# Patient Record
Sex: Female | Born: 1945
Health system: Southern US, Community
[De-identification: ages and names within clinical notes are randomized; demographics above are authoritative.]

## PROBLEM LIST (undated history)

## (undated) ENCOUNTER — Emergency Department (HOSPITAL_BASED_OUTPATIENT_CLINIC_OR_DEPARTMENT_OTHER): Admission: EM | Payer: Medicare Other | Source: Home / Self Care

## (undated) DIAGNOSIS — K219 Gastro-esophageal reflux disease without esophagitis: Secondary | ICD-10-CM

## (undated) DIAGNOSIS — I4891 Unspecified atrial fibrillation: Secondary | ICD-10-CM

## (undated) DIAGNOSIS — E28319 Asymptomatic premature menopause: Secondary | ICD-10-CM

## (undated) DIAGNOSIS — D72828 Other elevated white blood cell count: Secondary | ICD-10-CM

## (undated) DIAGNOSIS — D509 Iron deficiency anemia, unspecified: Secondary | ICD-10-CM

## (undated) DIAGNOSIS — J309 Allergic rhinitis, unspecified: Secondary | ICD-10-CM

## (undated) DIAGNOSIS — Z9889 Other specified postprocedural states: Secondary | ICD-10-CM

## (undated) DIAGNOSIS — Z85820 Personal history of malignant melanoma of skin: Secondary | ICD-10-CM

## (undated) DIAGNOSIS — I700 Atherosclerosis of aorta: Secondary | ICD-10-CM

## (undated) DIAGNOSIS — I5032 Chronic diastolic (congestive) heart failure: Secondary | ICD-10-CM

## (undated) DIAGNOSIS — E119 Type 2 diabetes mellitus without complications: Secondary | ICD-10-CM

## (undated) DIAGNOSIS — I272 Pulmonary hypertension, unspecified: Secondary | ICD-10-CM

## (undated) DIAGNOSIS — M47812 Spondylosis without myelopathy or radiculopathy, cervical region: Secondary | ICD-10-CM

## (undated) DIAGNOSIS — C801 Malignant (primary) neoplasm, unspecified: Secondary | ICD-10-CM

## (undated) DIAGNOSIS — I2510 Atherosclerotic heart disease of native coronary artery without angina pectoris: Secondary | ICD-10-CM

## (undated) DIAGNOSIS — M8580 Other specified disorders of bone density and structure, unspecified site: Secondary | ICD-10-CM

## (undated) DIAGNOSIS — R296 Repeated falls: Secondary | ICD-10-CM

## (undated) DIAGNOSIS — N183 Chronic kidney disease, stage 3 unspecified: Secondary | ICD-10-CM

## (undated) DIAGNOSIS — G4733 Obstructive sleep apnea (adult) (pediatric): Secondary | ICD-10-CM

## (undated) DIAGNOSIS — F419 Anxiety disorder, unspecified: Secondary | ICD-10-CM

## (undated) DIAGNOSIS — E785 Hyperlipidemia, unspecified: Secondary | ICD-10-CM

## (undated) DIAGNOSIS — M7500 Adhesive capsulitis of unspecified shoulder: Secondary | ICD-10-CM

## (undated) DIAGNOSIS — E559 Vitamin D deficiency, unspecified: Secondary | ICD-10-CM

## (undated) DIAGNOSIS — F32A Depression, unspecified: Secondary | ICD-10-CM

## (undated) DIAGNOSIS — R112 Nausea with vomiting, unspecified: Secondary | ICD-10-CM

## (undated) DIAGNOSIS — R32 Unspecified urinary incontinence: Secondary | ICD-10-CM

## (undated) DIAGNOSIS — F3340 Major depressive disorder, recurrent, in remission, unspecified: Secondary | ICD-10-CM

## (undated) DIAGNOSIS — J449 Chronic obstructive pulmonary disease, unspecified: Secondary | ICD-10-CM

## (undated) DIAGNOSIS — G4730 Sleep apnea, unspecified: Secondary | ICD-10-CM

## (undated) DIAGNOSIS — F329 Major depressive disorder, single episode, unspecified: Secondary | ICD-10-CM

## (undated) DIAGNOSIS — I480 Paroxysmal atrial fibrillation: Secondary | ICD-10-CM

## (undated) HISTORY — DX: Depression, unspecified: F32.A

## (undated) HISTORY — DX: Type 2 diabetes mellitus without complications: E11.9

## (undated) HISTORY — DX: Unspecified urinary incontinence: R32

## (undated) HISTORY — DX: Major depressive disorder, recurrent, in remission, unspecified: F33.40

## (undated) HISTORY — DX: Vitamin D deficiency, unspecified: E55.9

## (undated) HISTORY — DX: Other specified disorders of bone density and structure, unspecified site: M85.80

## (undated) HISTORY — DX: Chronic kidney disease, stage 3 unspecified: N18.30

## (undated) HISTORY — DX: Iron deficiency anemia, unspecified: D50.9

## (undated) HISTORY — DX: Unspecified atrial fibrillation: I48.91

## (undated) HISTORY — DX: Obstructive sleep apnea (adult) (pediatric): G47.33

## (undated) HISTORY — DX: Gastro-esophageal reflux disease without esophagitis: K21.9

## (undated) HISTORY — PX: ABDOMINAL HYSTERECTOMY: SHX81

## (undated) HISTORY — DX: Other elevated white blood cell count: D72.828

## (undated) HISTORY — DX: Asymptomatic premature menopause: E28.319

## (undated) HISTORY — DX: Atherosclerosis of aorta: I70.0

## (undated) HISTORY — DX: Atherosclerotic heart disease of native coronary artery without angina pectoris: I25.10

## (undated) HISTORY — DX: Chronic obstructive pulmonary disease, unspecified: J44.9

## (undated) HISTORY — DX: Allergic rhinitis, unspecified: J30.9

## (undated) HISTORY — DX: Chronic diastolic (congestive) heart failure: I50.32

## (undated) HISTORY — DX: Sleep apnea, unspecified: G47.30

## (undated) HISTORY — DX: Malignant (primary) neoplasm, unspecified: C80.1

## (undated) HISTORY — DX: Personal history of malignant melanoma of skin: Z85.820

## (undated) HISTORY — DX: Spondylosis without myelopathy or radiculopathy, cervical region: M47.812

## (undated) HISTORY — DX: Paroxysmal atrial fibrillation: I48.0

## (undated) HISTORY — DX: Hyperlipidemia, unspecified: E78.5

## (undated) HISTORY — DX: Repeated falls: R29.6

## (undated) HISTORY — DX: Adhesive capsulitis of unspecified shoulder: M75.00

## (undated) HISTORY — PX: FRACTURE SURGERY: SHX138

## (undated) HISTORY — DX: Pulmonary hypertension, unspecified: I27.20

## (undated) HISTORY — DX: Major depressive disorder, single episode, unspecified: F32.9

## (undated) HISTORY — PX: MELANOMA EXCISION: SHX5266

## (undated) HISTORY — PX: CATARACT EXTRACTION W/ INTRAOCULAR LENS IMPLANT: SHX1309

---

## 1999-05-03 ENCOUNTER — Other Ambulatory Visit: Admission: RE | Admit: 1999-05-03 | Discharge: 1999-05-03 | Payer: Self-pay | Admitting: *Deleted

## 2000-07-03 ENCOUNTER — Ambulatory Visit (HOSPITAL_COMMUNITY): Admission: RE | Admit: 2000-07-03 | Discharge: 2000-07-03 | Payer: Self-pay | Admitting: Family Medicine

## 2000-07-03 ENCOUNTER — Encounter: Payer: Self-pay | Admitting: Family Medicine

## 2002-05-28 ENCOUNTER — Emergency Department (HOSPITAL_COMMUNITY): Admission: EM | Admit: 2002-05-28 | Discharge: 2002-05-28 | Payer: Self-pay

## 2002-08-20 ENCOUNTER — Emergency Department (HOSPITAL_COMMUNITY): Admission: EM | Admit: 2002-08-20 | Discharge: 2002-08-20 | Payer: Self-pay | Admitting: Emergency Medicine

## 2002-08-20 ENCOUNTER — Encounter: Payer: Self-pay | Admitting: Emergency Medicine

## 2002-08-21 ENCOUNTER — Ambulatory Visit (HOSPITAL_COMMUNITY): Admission: RE | Admit: 2002-08-21 | Discharge: 2002-08-22 | Payer: Self-pay | Admitting: Orthopedic Surgery

## 2002-08-21 ENCOUNTER — Encounter: Payer: Self-pay | Admitting: Orthopedic Surgery

## 2003-08-16 ENCOUNTER — Other Ambulatory Visit: Admission: RE | Admit: 2003-08-16 | Discharge: 2003-08-16 | Payer: Self-pay | Admitting: Family Medicine

## 2003-08-26 ENCOUNTER — Ambulatory Visit (HOSPITAL_COMMUNITY): Admission: RE | Admit: 2003-08-26 | Discharge: 2003-08-26 | Payer: Self-pay | Admitting: Family Medicine

## 2003-09-23 ENCOUNTER — Encounter (INDEPENDENT_AMBULATORY_CARE_PROVIDER_SITE_OTHER): Payer: Self-pay | Admitting: *Deleted

## 2003-09-23 ENCOUNTER — Ambulatory Visit (HOSPITAL_COMMUNITY): Admission: RE | Admit: 2003-09-23 | Discharge: 2003-09-23 | Payer: Self-pay | Admitting: Gastroenterology

## 2003-09-24 ENCOUNTER — Other Ambulatory Visit: Admission: RE | Admit: 2003-09-24 | Discharge: 2003-09-24 | Payer: Self-pay | Admitting: Obstetrics and Gynecology

## 2003-10-13 ENCOUNTER — Ambulatory Visit: Admission: RE | Admit: 2003-10-13 | Discharge: 2003-10-13 | Payer: Self-pay | Admitting: Gynecology

## 2003-10-26 ENCOUNTER — Inpatient Hospital Stay (HOSPITAL_COMMUNITY): Admission: RE | Admit: 2003-10-26 | Discharge: 2003-10-28 | Payer: Self-pay | Admitting: Obstetrics and Gynecology

## 2003-10-26 ENCOUNTER — Encounter (INDEPENDENT_AMBULATORY_CARE_PROVIDER_SITE_OTHER): Payer: Self-pay | Admitting: *Deleted

## 2003-11-26 ENCOUNTER — Ambulatory Visit (HOSPITAL_COMMUNITY): Admission: RE | Admit: 2003-11-26 | Discharge: 2003-11-26 | Payer: Self-pay | Admitting: Family Medicine

## 2004-12-26 ENCOUNTER — Other Ambulatory Visit: Admission: RE | Admit: 2004-12-26 | Discharge: 2004-12-26 | Payer: Self-pay | Admitting: Obstetrics and Gynecology

## 2005-06-01 ENCOUNTER — Emergency Department (HOSPITAL_COMMUNITY): Admission: EM | Admit: 2005-06-01 | Discharge: 2005-06-01 | Payer: Self-pay | Admitting: Emergency Medicine

## 2009-05-27 ENCOUNTER — Other Ambulatory Visit: Admission: RE | Admit: 2009-05-27 | Discharge: 2009-05-27 | Payer: Self-pay | Admitting: Family Medicine

## 2010-07-14 NOTE — Discharge Summary (Signed)
NAME:  Sarah Miller, Sarah Miller                         ACCOUNT NO.:  [id number]   MEDICAL RECORD NO.:  [id number]                   PATIENT TYPE:  INP   LOCATION:  0451                                 FACILITY:  [facility]   PHYSICIAN:  Randye Lobo, M.D.                DATE OF BIRTH:  [birth year]   DATE OF ADMISSION:  10/26/2003  DATE OF DISCHARGE:  10/28/2003                                 DISCHARGE SUMMARY   ADMISSION DIAGNOSES:  1.  Pelvic mass.  2.  Elevated CA-125.   DISCHARGE DIAGNOSIS:  Benign left ovarian cyst.   SIGNIFICANT OPERATIONS AND PROCEDURES:  The patient underwent an exploratory  laparotomy with total abdominal hysterectomy and bilateral salpingo-  oophorectomy with frozen section of the left ovary, on October 26, 2003,  under the direction of Dr. Conley Simmonds and with the assistance of Dr. De Blanch.   ADMISSION HISTORY AND PHYSICAL EXAMINATION:  The patient is a 65 year old  para 1, post menopausal, Caucasian female who presented with a known pelvic  mass which was identified on CT scan, ordered by Dr. Laurann Montana, for  investigation of pelvic pressure symptoms.  The patient was noted to have a  6 x 10-cm, solid, pelvic mass which was thought to be possibly consistent  with a pedunculated fibroid.  The patient was referred for further  evaluation and treatment, and a CA-125 was ordered and found to be in the  60s.  The patient subsequently did undergo gynecologic oncologic  consultation with Dr. De Blanch in preparation for surgery.  The  patient did have a repeat CA-125 performed which was noted to decrease  slightly into the 50 range.  A plan was made for exploratory laparotomy with total abdominal hysterectomy  and bilateral salpingo-oophorectomy with a possible staging procedure.  The patient was admitted, on October 26, 2003, at which time she underwent an  exploratory laparotomy with frozen section of the left ovary which  documented  either a benign fibroma or fibrothecoma.  The patient did undergo  a total abdominal hysterectomy with bilateral salpingo-oophorectomy.  The  patient's surgery was uncomplicated.  The findings at surgery documented a  16 x 10-cm fibrous, white, enlarged, left ovary.  The fallopian tubes were  consistent with a bilateral tubal ligation.  The remainder of the pelvic and  abdominal anatomy was unremarkable.  Postoperatively, the patient experienced nausea and vomiting for  approximately the first 24 hours postoperatively.  The patient was treated  with a combination of both oral and intravenous antiemetics.  Her nausea  responded best to IV Zofran.  The patient was treated with ketorolac and  Dilaudid for pain control.  At postoperative day #1 in the evening, the patient began to tolerated a  full liquid diet and developed normal bowel sounds.  She was able to  ambulate independently.  Her Foley catheter was removed on post-op day #1,  and she  was able to void spontaneously.  Her postoperative #1, potassium was  noted  to be 3.3.  The patient received D-5 1/2 normal saline with 30 mEq of  KCl per liter to treat this during the time that she was not taking p.o.  intake well.  Her hemoglobin measured 10.2, and she was tolerating this  without difficulty.  By postoperative day #2, the patient was found to be in good condition and  ready for discharge.  She was able to tolerate a regular diet prior to  discharge.  Her incision remained clean, dry and intact.  The patient  demonstrated no significant fevers during her hospitalization.   DISPOSITION:  The patient is found to be in good condition and ready for  discharge on post-op day #2.   INSTRUCTIONS AT DISCHARGE:  1.  Discharged to home.  2.  The patient will take the following medications:      1.  Percocet (325/5 mg) 1-2 p.o. q.4-6h. p.r.n. pain.      2.  She may also take ibuprofen 600 mg p.o. q.6h. p.r.n. pain.  3.  The patient will  follow a regular diet as tolerated.  4.  She will have decreased activity for the next six weeks.  5.  The patient will follow up in the office on November 02, 2003 for staple      removal.  6.  The patient will call if she experiences any problems with fever,      recurrent nausea and vomiting, pain uncontrolled by her medication,      heavy vaginal bleeding, redness or drainage from the incision, or any      other concern.                                               Randye Lobo, M.D.    BES/MEDQ  D:  10/29/2003  T:  10/30/2003  Job:  [id number]

## 2010-07-14 NOTE — Consult Note (Signed)
   NAME:  Sarah Miller, Sarah Miller                         ACCOUNT NO.:  [id number]   MEDICAL RECORD NO.:  [id number]                   PATIENT TYPE:  EMS   LOCATION:  ED                                   FACILITY:  [facility]   PHYSICIAN:  Artist Pais. Mina Marble, M.D.           DATE OF BIRTH:  23-Nov-1945   DATE OF CONSULTATION:  08/20/2002  DATE OF DISCHARGE:                                   CONSULTATION   REASON FOR CONSULTATION:  This is a very pleasant 65 year old right hand  dominant female who slipped in the mud and fell onto her outstretched left  upper extremity.  She presented today with pain and deformity to her left  hand and wrist.   ALLERGIES:  PENICILLIN.   PAST MEDICAL HISTORY:  No significant past medical history.   FAMILY HISTORY:  Noncontributory.   SOCIAL HISTORY:  Noncontributory.   REVIEW OF SYMPTOMS:  Noncontributory.   PHYSICAL EXAMINATION:  GENERAL:  A well-nourished, well-developed female,  pleasant, alert, oriented x3.  LEFT UPPER EXTREMITY:  She has obvious deformity in the hand and wrist area.  She is neurovascularly intact grossly with __________ function.  SKIN:  Intact.  No erythema, drainage, or signs of infection, but an obvious  deformity to the hand and wrist area which is painful to palpation.   X-RAYS:  X-rays show a distal articular __________ fracture with about 50  degrees or dorsal angulation and significant shortening.   The patient was given 1% plain lidocaine Hematome block, was placed in  finger trap traction, a closed reduction was performed.  She was placed in a  well-padded sugar tong splint.  Post-reduction films showed good reduction  at the AP and lateral plane.  The patient was discharged from the emergency  department with Percocet for pain, #30, to be taken 1-2 q.3-4h. as needed  for pain.  She is to follow up in my office in the next 24-48 hours.  I  explained to her there is a good chance this will need operative fixation,  as the  initial displacement was greater than 50 degrees and is an unstable  fracture.  Again, the patient was discharged with Percocet for pain and is  to follow up in my office in the next 24-48 hours.                                               Artist Pais Mina Marble, M.D.    MAW/MEDQ  D:  08/20/2002  T:  08/20/2002  Job:  [id number]

## 2010-07-14 NOTE — H&P (Signed)
NAME:  Sarah Miller, Sarah Miller                         ACCOUNT NO.:  [id number]   MEDICAL RECORD NO.:  [id number]                   PATIENT TYPE:  INP   LOCATION:  NA                                   FACILITY:  [facility]   PHYSICIAN:  Randye Lobo, M.D.                DATE OF BIRTH:  Jan 24, 1946   DATE OF ADMISSION:  DATE OF DISCHARGE:                                HISTORY & PHYSICAL   Date of preoperative history and physical exam:  October 21, 2003.   CHIEF COMPLAINT:  Pelvic mass.   HISTORY OF PRESENT ILLNESS:  The patient is a 65 year old para 1, Caucasian  female with a history of excision of a melanoma in 1978 and 1979, who is  referred from [place]. White, M.D., for evaluation of a pelvic mass noted  on CT scan, which was requested due to the patient's symptoms of increasing  pelvic pressure.  The CT scan on September 02, 2003, documented a 16 x 11 x 10 cm  central pelvic mass with central necrosis and a thin pedicle, which was  thought to be consistent all with a possible pedunculated fibroid.  The  ovaries were not identified separately from the pelvic mass.  A small amount  of free fluid was noted in the pelvis.  The abdominal CT scan was remarkable  with no evidence specifically of any retroperitoneal mass or adenopathy.  The patient had a CA125 on September 24, 2003, which measured 62.3.  The patient  does have a history of ovarian cancer of her mother at age 38.   The patient does report occasional left lower quadrant pain, which does  limit her ambulation.  She denies any night sweats.  She denies any increase  in her abdominal girth, but she reports a sensation of fullness in her  abdomen and early satiety.   The patient was recently noted to have hematuria in her primary care  Nyzir Dubois's office, and she saw Dr. Vonita Moss, her urologist, who performed a  workup including an ultrasound of the kidneys, which was normal.  The  patient is also status post colonoscopy on September 23, 2003, at  which time she  had removal of two benign polyps.   The patient wishes for a surgical evaluation and treatment of her pelvic  mass.   PAST OBSTETRICAL AND GYNECOLOGIC HISTORY:  Status post spontaneous vaginal  delivery at age 65.  Last menstrual period at age 65.  The patient used  hormone replacement therapy for one month only in the remote past.  Status  post laparoscopic bilateral tubal ligation at age 65.  History of both  Trichomonas and condyloma acuminata.  Status post cryotherapy to the cervix  several years ago with a recent Pap smear on September 24, 2003, which was within  normal limits.  Status post aspiration of a left breast cyst.  Last  mammogram on September 24, 2003, was  within normal limits.   PAST MEDICAL/SURGICAL HISTORY:  1. Melanoma excision x2 with a skin graft (1978, 1979).  2. History of a positive PPD with a negative chest x-ray.  The patient     declined any chemoprophylaxis.  3. Status post fracture of the left wrist with surgical repair on August 21, 2002.  4. Status post laparoscopic bilateral tubal ligation.  5. Tobacco use.  6. Elevated LDL cholesterol, diet-controlled.   MEDICATIONS:  Wellbutrin SR 150 mg p.o. daily.   ALLERGIES:  PENICILLIN.   SOCIAL HISTORY:  The patient is divorced.  She works as a Marine scientist.  She smokes one-half package of cigarettes per day and is  currently in the process of quitting.  She denies the use of alcohol or  illicit drugs.   FAMILY HISTORY:  Positive for ovarian cancer in the patient's mother at age  17.  Positive for testicular cancer in the patient's father.  No family  history of breast, uterine, or colon cancer.   REVIEW OF SYSTEMS:  Please refer to the history of present illness.   PHYSICAL EXAMINATION:  VITAL SIGNS:  Blood pressure 120/72.  GENERAL:  The patient is a middle-aged Caucasian female in no acute  distress.  HEENT:  Normocephalic, atraumatic.  NECK:  Negative for adenopathy and  thyromegaly.  CHEST:  Lungs clear to auscultation bilaterally.  CARDIAC:  S1, S2, with a regular rate and rhythm.  No evidence of a murmur,  rub, or gallop.  ABDOMEN:  Soft and nontender.  There is a mass which is palpable to the  level of the umbilicus.  It is noted to be mobile and firm.  BREASTS:  No dominant masses, skin retractions, axillary adenopathy, or  nipple discharge bilaterally.  PELVIC:  Normal external genitalia and urethra.  The cervix is unremarkable.  Bimanual examination demonstrates an 18-week size mobile mass, which cannot  be distinguished from ovaries bilaterally.  RECTAL:  No masses.   IMPRESSION:  The patient is a 65 year old para 1 female with a history of  melanoma, who now presents with a large pelvic mass and elevated CA125.  There is a positive family history of ovarian cancer.   PLAN:  The patient will undergo an exploratory laparotomy with total  abdominal hysterectomy and bilateral salpingo-oophorectomy with frozen  section and possible staging at [facility] with the assistance of  De Blanch, M.D., who has also seen the patient in consultation.  Risks, benefits, and alternatives have been discussed with the patient, who  wishes to proceed.                                               Randye Lobo, M.D.    BES/MEDQ  D:  10/25/2003  T:  10/25/2003  Job:  [id number]   cc:   De Blanch, M.D.   Stacie Acres White, M.D.  [address]  [place]  [place] [address]  Fax: [phone]

## 2010-07-14 NOTE — Op Note (Signed)
NAME:  Sarah Miller, Sarah Miller                         ACCOUNT NO.:  [id number]   MEDICAL RECORD NO.:  [id number]                   PATIENT TYPE:  AMB   LOCATION:  ENDO                                 FACILITY:  [facility]   PHYSICIAN:  Graylin Shiver, M.D.                DATE OF BIRTH:  Jan 15, 1946   DATE OF PROCEDURE:  09/23/2003  DATE OF DISCHARGE:                                 OPERATIVE REPORT   PROCEDURE:  Colonoscopy with polypectomy and biopsy.   INDICATIONS FOR PROCEDURE:  Screening.   Informed consent was obtained after explanation of the risks of bleeding,  infection and perforation.   PREMEDICATION:  Fentanyl 100 mcg IV, Versed 10 mg IV.   DESCRIPTION OF PROCEDURE:  With the patient in the left lateral decubitus  position, a rectal examination was performed.  No masses were felt.  The  Olympus colonoscope was inserted into the rectum and advanced around the  colon to the cecum.  Cecal landmarks were identified.  The cecum looked  normal.  In the mid ascending colon, there was a small 3 mm polyp biopsied  off with cold forceps.  The transverse colon looked normal.  In the mid  descending colon there was a 5 mm sessile polyp snared with a minisnare and  removed by snare cautery technique.  Cautery site looked good. The polyp was  retrieved.  The sigmoid and rectum were normal.  The patient tolerated the  procedure well without complications.   IMPRESSION:  Colon polyps, diagnosis code 211.3.   PLAN:  Pathology will be checked.                                               Graylin Shiver, M.D.    Germain Osgood  D:  09/23/2003  T:  09/23/2003  Job:  [id number]   cc:   Stacie [place]. White, M.D.  [address]  [place]  [place] [address]  Fax: [phone]

## 2010-07-14 NOTE — Consult Note (Signed)
NAME:  Sarah Miller, Sarah Miller NO.:  [id number]   MEDICAL RECORD NO.:  [id number]                   PATIENT TYPE:  OUT   LOCATION:  GYN                                  FACILITY:  [facility]   PHYSICIAN:  De Blanch, M.D.         DATE OF BIRTH:  07/03/1945   DATE OF CONSULTATION:  10/13/2003  DATE OF DISCHARGE:                                   CONSULTATION   A 65 year old white female seen in consultation at the request of Randye Lobo, M.D., regarding a newly-diagnosed large abdominal-pelvic mass.  The  patient initially presented with urinary frequency and bladder pressure and  was initially referred to a urologist, who discovered an abdominal-pelvic  mass.  Further investigation, including a CT scan and an ultrasound, showed  the mass to be solid and possibly arising from the uterus.  In total the  mass measures 16 x 11 x 10 cm.  There is a small amount of free fluid, and  her CA125 value is slightly elevated at 62.3 units/mL.   The patient's bladder pressure has resolved.  She denies any abnormal  bleeding.  She went through menopause at approximately age 72 and has not  used any hormone replacement therapy in a number of years.   PAST MEDICAL HISTORY:  Medical illnesses:  None.   PAST SURGICAL HISTORY:  Left wrist surgically repaired for a fracture.  Melanoma on her back with recurrence in 1979, subsequently treated with  immunotherapy at [facility].  She has had no evidence of recurrent  disease.   DRUG ALLERGIES:  PENICILLIN (rash).   CURRENT MEDICATIONS:  Wellbutrin.   PAST OBSTETRICAL HISTORY:  Gravida 1.   SOCIAL HISTORY:  The patient is single.  She works in Clinical biochemist, and  she has just entered a smoking cessation program.  She comes accompanied by  her daughter.   REVIEW OF SYSTEMS:  Negative except as noted above.   PHYSICAL EXAMINATION:  VITAL SIGNS:  Height 5 feet 2, weight 210 pounds.  Blood pressure 126/84,  pulse 84.  GENERAL:  The patient is a moderately obese white female in no acute  distress.  HEENT:  Negative.  NECK:  Supple without thyromegaly.  There is no supraclavicular or inguinal  adenopathy.  ABDOMEN:  Soft.  There is a palpable mass that extends to the umbilicus.  There is no evidence of ascites or other organomegaly.  PELVIC:  EG, BUS, vagina, bladder, urethra were normal.  Cervix is normal.  Uterus seems to be pushed anteriorly up against the bladder by a mass, which  rises up to the umbilicus.  The uterus seems to be movable separate from the  mass.   IMPRESSION:  Solid abdominal-pelvic mass, either a pedunculated fibroid,  uterine sarcoma, or an ovarian fibroma.  The possibility of malignancy was  discussed with the patient and her daughter, although a benign mass is  certainly possible.  In  either event, we would recommend that the patient  undergo exploratory laparotomy, total abdominal hysterectomy with bilateral  salpingo-oophorectomy.  If malignancy is identified, then further surgical  staging including omentectomy, pelvic and periaortic lymphadenectomy may be  advised at the same time.  The patient accepts the risks of surgery,  including hemorrhage, infection, injury to adjacent viscera, thromboembolic  complications, and anesthetic risks.  We will go ahead with preoperative  evaluation and coordinate surgery with Dr. Conley Simmonds.                                               De Blanch, M.D.    DC/MEDQ  D:  10/13/2003  T:  10/14/2003  Job:  [id number]   cc:   Randye Lobo, M.D.  [address]  [place]  [place] [address]  Fax: [phone]   Stacie Acres. White, M.D.  [address]  [place]  [place] [address]  Fax: [phone]   Telford Nab, R.N.  [phone] N. [address]  [place], [place] [address]

## 2010-07-14 NOTE — Op Note (Signed)
NAME:  Sarah Miller, Sarah Miller                         ACCOUNT NO.:  [id number]   MEDICAL RECORD NO.:  [id number]                   PATIENT TYPE:  OIB   LOCATION:  5029                                 FACILITY:  [facility]   PHYSICIAN:  Dionne Ano. Everlene Other, M.D.         DATE OF BIRTH:  05-Nov-1945   DATE OF PROCEDURE:  08/21/2002  DATE OF DISCHARGE:  08/22/2002                                 OPERATIVE REPORT   PREOPERATIVE DIAGNOSIS:  Left comminuted complex intra-articular radius  fracture with distal ulnar fracture noted.   POSTOPERATIVE DIAGNOSIS:  Left comminuted complex intra-articular radius  fracture with distal ulnar fracture noted.   PROCEDURES:  1. Open reduction and internal fixation, left radius fracture, with DVR     plate and Minimig bone graft from [facility].  2. Evaluation under anesthesia and manipulation under anesthesia, left     distal radioulnar joint.  3. Stress radiography, left wrist.   SURGEON:  Dionne Ano. Amanda Pea, M.D.   COMPLICATIONS:  None.   ANESTHESIA:  General endotracheal anesthetic.   DRAINS:  One #7 TLS.   TOURNIQUET TIME:  Less than an hour.   ESTIMATED BLOOD LOSS:  Minimal.   INDICATION FOR PROCEDURE:  This patient is a 65 year old female who presents  with the above-mentioned diagnosis.  I have counseled her in regard to risks  and benefits of surgery, including risk of infection, bleeding, anesthesia,  damage to normal structures, and failure of surgery to accomplish its  intended goals of relieving symptoms and restoring function.  With this in  mind, she desires to proceed.  All questions have been encouraged and  answered preoperatively.   OPERATIVE FINDINGS:  This patient had a comminuted, complex intra-articular  radius fracture treated with ORIF without difficulty.  She tolerated the  procedure well.  There were no intraoperative complications.  She had  excellent stability, and bone-grafting was placed.  Radiographic height,  volar tilt, and inclination were restored nicely.  The distal radioulnar  joint was stable and was noted to be on manipulation under anesthesia.  I  was pleased with the operation and the restoration of stability.   OPERATIVE PROCEDURE IN DETAIL:  The patient was seen by myself and  anesthesia, taken to the operative suite.  She was given vancomycin 500 mg  in the holding area and following administration of prophylactic antibiotics  was taken to the operative suite and underwent a smooth induction of general  endotracheal anesthesia.  She was then laid supine, appropriately padded,  prepped and draped in the usual sterile fashion about the left upper  extremity.  Once this was done and the sterile field was secured, the  patient had fingertrap traction applied under sterile field, and  manipulation was applied.  I outlined the fracture and felt that it was  amenable to distal volar radius plate fixation; thus, I inflated the  tourniquet to 285 mmHg and made a volar radial  incision.  The skin was  incised.  FCR tendon sheath was incised palmarly and dorsally, retracted  ulnarly.  Following this, pronator quadratus was lifted off of the radius in  a radial to ulnar fashion.  The carpal canal contents were swept ulnarly to  access the fracture site and then irrigated the area and performed  provisional fixation with Kirschner wire.  Following this, application of  the DVR plate was accomplished in standard AO technique.  Excellent purchase  was obtained.  Seven combination threaded pegs or regular pegs were placed  distally and four 3.5 mm screws were placed proximally.  The patient  tolerated this well without difficulty.  Once this was done I assessed her  radiographically.  I was very pleased with the alignment.  The volar tilt as  well as the radial inclination and height were restored nicely.  With this  performed, I then mixed the Minimig mixture of the bone graft in to fill the   dorsal V defect.  This was done after mixing the Minimig combination.  I  mixed this and then placed it through a screw hole prior to placement of the  last peg.  The patient had this filled nicely within the metaphyseal bone  loss, and this was checked under radiograph and placed during radiographic  analysis.  Following this the patient then had the last threaded peg placed.  I performed AP and lateral oblique x-rays.  Stress radiography at this point  revealed excellent stability, and I was very pleased.  Following this I  performed evaluation under anesthesia and manipulation under anesthesia of  the distal radioulnar joint, revealing excellent stability.  I was pleased  with the stability and alignment and felt that the ulnar styloid fracture  would not need operative fixation at this juncture.  Thus the tourniquet was  deflated, the pronator quadratus was reapproximated.  A #7 TLS drain was  placed and following this, the wound was closed by 3-0 Vicryl followed by 3-  0 Prolene.  The patient tolerated this well.  A Xeroform was placed.  The  patient had no intraoperative complications.  Compartments were soft, and 30  mL of Marcaine without epinephrine was placed in the wound for postop  analgesia.  The patient tolerated this well.  Following this, sterile  dressing was placed followed by volar plaster slabs, and the patient then  underwent placement of a suction Vacutainer about the drain apparatus.  She  then was awoken from anesthesia, taken to the recovery room, and elevated.  She will be admitted for IV antibiotics, observation, and general postop  measures.  I have discussed with her all do's and don'ts, etc., and all  questions have been encouraged and answered.                                               Dionne Ano. Everlene Other, M.D.    Nash Mantis  D:  08/21/2002  T:  08/23/2002  Job:  [id number]

## 2010-07-14 NOTE — Op Note (Signed)
NAME:  Sarah Miller, Sarah Miller                         ACCOUNT NO.:  [id number]   MEDICAL RECORD NO.:  [id number]                   PATIENT TYPE:  INP   LOCATION:  0009                                 FACILITY:  [facility]   PHYSICIAN:  Randye Lobo, M.D.                DATE OF BIRTH:  1945-07-21   DATE OF PROCEDURE:  10/26/2003  DATE OF DISCHARGE:                                 OPERATIVE REPORT   PREOPERATIVE DIAGNOSES:  1.  Pelvic mass.  2.  Elevated CA 125.   POSTOPERATIVE DIAGNOSES:  Benign left ovarian cyst.   PROCEDURE:  1.  Exploratory laparotomy.  2.  Total abdominal hysterectomy with bilateral salpingo-oophorectomy and      frozen section of the left ovary.   SURGEON:  Conley Simmonds, M.D.   ASSISTANT:  De Blanch, M.D.   ANESTHESIA:  General endotracheal.   INTRAVENOUS FLUIDS:  2300 mL Ringer's lactate.   ESTIMATED BLOOD LOSS:  100-150 mL.   URINE OUTPUT:  300 mL.   COMPLICATIONS:  None.   INDICATIONS FOR PROCEDURE:  The patient is a 65 year old, para 1, Caucasian  female with a last menstrual period in her 66s, who presented from Dr.  Laurann Montana for evaluation of a solid pelvic mass which was seen on CT  scan.  The mass measured approximately 10 x 16 cm and was thought to be a  possible pedunculated fibroid.  The patient had a small amount of fluid in  the pelvis, but the remainder of the CT scan was unremarkable.  The patient  did have a CA 125 which was noted to be 62.3.  The patient did undergo  consultation with Dr. De Blanch regarding these findings.  Her  medical history was significant for a remote history of a melanoma, and her  family history was significant for ovarian cancer in her mother.   A plan was made to proceed with an exploratory laparotomy with total  abdominal hysterectomy and bilateral salpingo-oophorectomy and staging  procedure if necessary after risks, benefits, and alternatives were reviewed  with her.   FINDINGS:   Laparotomy demonstrated an enlarged left ovary measuring  approximately 10 x 16 cm which had a white fibrous appearance to it.  Frozen  section did document a benign fibroma or a fibro__________.  The patient had  a normal-appearing uterus and right ovary.  Both of the fallopian tubes had  an appearance consistent with prior tubal ligation but were otherwise  normal.  There was a moderate amount of ascites fluid noted in the pelvis.   Exploration of the upper abdomen documented a smooth liver and normal  gallbladder, spleen, kidneys, periaortic region, and peritoneal surfaces.   Specimens.  The uterus, tubes, and ovaries were sent to pathology.   DESCRIPTION OF PROCEDURE:  The patient was reidentified in the preoperative  hold area.  She did receive a dose of Ancef for antibiotic prophylaxis, and  she received TED hose for DVT prophylaxis.   In the operating room, general endotracheal anesthesia was induced, and the  patient was then placed in the dorsal lithotomy position.  The abdomen and  vagina were sterilely prepped, and a Foley catheter was placed inside the  bladder.  She was sterilely draped.   The procedure began with a vertical midline incision which was created  sharply with a scalpel.  The dissection was carried down through the  subcutaneous tissue and then incised the rectus fascia in a vertical fashion  using the scalpel.  The parietal peritoneum was elevated and was entered  sharply with the Metzenbaum scissors.  The incision was then extended  cranially and caudally using monopolar cautery.   Ascites fluid was encountered, and a sample was taken for potential  pathology and set aside.  An exploration of the pelvis and upper abdomen was  then performed, and the findings are as noted above.   A Bookwalter retractor was placed inside the abdomen, and the procedure  began with isolation of the left infundibulopelvic ligament.  The pelvic  sidewall was opened, and the  infundibulopelvic ligament was isolated  separately from the left ureter.  The infundibulopelvic ligament was then  doubly clamped and sharply divided and suture ligated with a free tie of 0  Vicryl followed by a suture ligature of the same.  The specimen was then  removed and taken down to pathology for frozen section.  The findings are as  noted above.   A clamp was then placed across the adnexal structures on the patient's right-  hand side.  The right pelvic sidewall was then opened with monopolar  cautery, and again the infundibulopelvic ligament on the patient's right-  hand side was isolated separately from the right ureter.  The ligament was  then doubly clamped and sharply divided and was ligated with a free tie of 0  Vicryl followed by a suture ligature of the same for excellent hemostasis.  The round ligament on the patient's right-hand side was then divided using  monopolar cautery, as was performed on the patient's left-hand side prior to  removal of that tube and ovary.  The bladder flap was then created with a  combination of sharp dissection and monopolar cautery.  Hemostasis was  created using the monopolar cautery.  Each of the uterine arteries were then  clamped, sharply divided, and suture ligated with 0 Vicryl.  The remainder  of the cardinal ligaments were then clamped, sharply divided, and suture  ligated with transfixing sutures of the Vicryl suture.  The rectovaginal  septum was entered this time using monopolar cautery.  The uterosacral  ligaments were then clamped, sharply divided, and suture ligated with  transfixing sutures of 2-0 Vicryl.  Clamps were then placed at the bottom of  the cervix, and the specimen was sharply excised and removed.  Transfixing  sutures of 0 Vicryl were used to create angle sutures.  The remainder of the  vagina was closed with figure-of-eight sutures of 0 Vicryl.  The specimen was removed and sent to pathology.  As the frozen section  report came back  consistent with a benign left ovarian cyst, the pelvic washings were  discarded at this point.   The pelvis was then irrigated and suctioned.  Monopolar cautery was used to  help create hemostasis along the vaginal cuffs near the bladder anteriorly.  A figure-of-eight suture of 2-0 Vicryl was used along the peritoneum of the  rectovaginal  septum in order to close this and create hemostasis.   All of the other pedicle sites were examined and were found to be  hemostatic, and the abdomen was therefore closed at this time.   All the lap pads and the self-retaining retractor were removed.  The abdomen  was closed with a 0 PDS which was performed as a mass closure along the  upper portion of the incision and a running closure of the fascia  inferiorly.  The subcutaneous tissue was then irrigated and suctioned and  made hemostatic with monopolar cautery and interrupted sutures of 2-0 Vicryl  were placed in the subcutaneous layer.  The skin was closed with staples,  and a bandage was placed over this.   This concluded the patient's procedure.  There were no complications.  All  needle, instrument, and sponge counts were correct.                                               Randye Lobo, M.D.    BES/MEDQ  D:  10/26/2003  T:  10/26/2003  Job:  [id number]   cc:   De Blanch, M.D.   Stacie Acres White, M.D.  [address]  [place]  [place] [address]  Fax: [phone]

## 2012-12-11 ENCOUNTER — Ambulatory Visit
Admission: RE | Admit: 2012-12-11 | Discharge: 2012-12-11 | Disposition: A | Discharge status: Home / Self Care | Payer: Medicare Other | Source: Ambulatory Visit | Attending: Family Medicine | Admitting: Family Medicine

## 2012-12-11 ENCOUNTER — Other Ambulatory Visit: Payer: Self-pay | Admitting: Family Medicine

## 2012-12-11 DIAGNOSIS — R0600 Dyspnea, unspecified: Secondary | ICD-10-CM

## 2012-12-11 DIAGNOSIS — F329 Major depressive disorder, single episode, unspecified: Secondary | ICD-10-CM | POA: Diagnosis not present

## 2012-12-11 DIAGNOSIS — R7989 Other specified abnormal findings of blood chemistry: Secondary | ICD-10-CM | POA: Diagnosis not present

## 2012-12-11 DIAGNOSIS — F411 Generalized anxiety disorder: Secondary | ICD-10-CM | POA: Diagnosis not present

## 2012-12-11 DIAGNOSIS — R5381 Other malaise: Secondary | ICD-10-CM | POA: Diagnosis not present

## 2012-12-11 DIAGNOSIS — Z23 Encounter for immunization: Secondary | ICD-10-CM | POA: Diagnosis not present

## 2012-12-11 DIAGNOSIS — E559 Vitamin D deficiency, unspecified: Secondary | ICD-10-CM | POA: Diagnosis not present

## 2012-12-11 DIAGNOSIS — R0602 Shortness of breath: Secondary | ICD-10-CM | POA: Diagnosis not present

## 2012-12-11 DIAGNOSIS — R635 Abnormal weight gain: Secondary | ICD-10-CM | POA: Diagnosis not present

## 2013-01-21 DIAGNOSIS — E119 Type 2 diabetes mellitus without complications: Secondary | ICD-10-CM | POA: Diagnosis not present

## 2013-01-21 DIAGNOSIS — Z23 Encounter for immunization: Secondary | ICD-10-CM | POA: Diagnosis not present

## 2013-01-21 DIAGNOSIS — F329 Major depressive disorder, single episode, unspecified: Secondary | ICD-10-CM | POA: Diagnosis not present

## 2013-03-03 DIAGNOSIS — E119 Type 2 diabetes mellitus without complications: Secondary | ICD-10-CM | POA: Diagnosis not present

## 2013-03-03 DIAGNOSIS — F329 Major depressive disorder, single episode, unspecified: Secondary | ICD-10-CM | POA: Diagnosis not present

## 2013-03-03 DIAGNOSIS — F17200 Nicotine dependence, unspecified, uncomplicated: Secondary | ICD-10-CM | POA: Diagnosis not present

## 2013-03-03 DIAGNOSIS — Z0000 Encounter for general adult medical examination without abnormal findings: Secondary | ICD-10-CM | POA: Diagnosis not present

## 2013-03-03 DIAGNOSIS — I4510 Unspecified right bundle-branch block: Secondary | ICD-10-CM | POA: Diagnosis not present

## 2013-03-04 ENCOUNTER — Encounter: Payer: Self-pay | Admitting: Dietician

## 2013-03-04 ENCOUNTER — Encounter: Payer: Medicare Other | Attending: Family Medicine | Admitting: Dietician

## 2013-03-04 VITALS — Ht 62.0 in | Wt 214.5 lb

## 2013-03-04 DIAGNOSIS — Z713 Dietary counseling and surveillance: Secondary | ICD-10-CM | POA: Diagnosis not present

## 2013-03-04 DIAGNOSIS — IMO0001 Reserved for inherently not codable concepts without codable children: Secondary | ICD-10-CM | POA: Diagnosis not present

## 2013-03-04 DIAGNOSIS — E1165 Type 2 diabetes mellitus with hyperglycemia: Principal | ICD-10-CM

## 2013-03-04 DIAGNOSIS — E119 Type 2 diabetes mellitus without complications: Secondary | ICD-10-CM

## 2013-03-04 NOTE — Patient Instructions (Signed)
Goals:  Monitor glucose levels as instructed by your doctor  Bring food record and glucose log to your next nutrition visit 

## 2013-03-04 NOTE — Progress Notes (Signed)
Patient was seen on 03/04/2013 for the first of a series of three diabetes self-management courses at the [facility].  Current HbA1c: 6.7% 10/14  The following learning objectives were met by the patient during this class:  Describe diabetes  State some common risk factors for diabetes  Defines the role of glucose and insulin  Identifies type of diabetes and pathophysiology  Describe the relationship between diabetes and cardiovascular risk  State the members of the Healthcare Team  States the rationale for glucose monitoring  State when to test glucose  State their individual Target Range  State the importance of logging glucose readings  Describe how to interpret glucose readings  Identifies A1C target  Explain the correlation between A1c and eAG values  State symptoms and treatment of high blood glucose  State symptoms and treatment of low blood glucose  Explain proper technique for glucose testing  Identifies proper sharps disposal  Handouts given during class include:  Living Well with Diabetes book  Carb Counting and Meal Planning book  Meal Plan Card  Carbohydrate guide  Meal planning worksheet  Low Sodium Flavoring Tips  The diabetes portion plate  Low Carbohydrate Snack Suggestions  A1c to eAG Conversion Chart  Diabetes Medications  Stress Management  Diabetes Recommended Care Schedule  Diabetes Success Plan  Core Class Satisfaction Survey  Your patient has identified their diabetes care support plan as:  [facility]  Staff  Follow-Up Plan:  Attend core 2

## 2013-03-11 DIAGNOSIS — E118 Type 2 diabetes mellitus with unspecified complications: Principal | ICD-10-CM

## 2013-03-11 DIAGNOSIS — E1165 Type 2 diabetes mellitus with hyperglycemia: Secondary | ICD-10-CM

## 2013-03-11 DIAGNOSIS — IMO0002 Reserved for concepts with insufficient information to code with codable children: Secondary | ICD-10-CM

## 2013-03-11 NOTE — Progress Notes (Signed)

## 2013-03-18 DIAGNOSIS — E119 Type 2 diabetes mellitus without complications: Secondary | ICD-10-CM

## 2013-03-18 NOTE — Progress Notes (Signed)
Patient was seen on 03/18/13 for the third of a series of three diabetes self-management courses at the [facility]. The following learning objectives were met by the patient during this class:    State the amount of activity recommended for healthy living   Describe activities suitable for individual needs   Identify ways to regularly incorporate activity into daily life   Identify barriers to activity and ways to over come these barriers  Identify diabetes medications being personally used and their primary action for lowering glucose and possible side effects   Describe role of stress on blood glucose and develop strategies to address psychosocial issues   Identify diabetes complications and ways to prevent them  Explain how to manage diabetes during illness   Evaluate success in meeting personal goal   Establish 2-3 goals that they will plan to diligently work on until they return for the  68-monthfollow-up visit  Goals:  Follow Diabetes Meal Plan as instructed  Aim for 15-30 mins of physical activity daily as tolerated  Bring food record and glucose log to your follow up visit  Your patient has established the following 4 month goals in their individualized success plan:  Count carbohydrates at most meals and snacks  Reduce fat in diet by eating less fried foods at 2 or more meals a day  Be active 10 minutes or more 5 times a week  Test glucose 2 twice a day, 7 days a week  Look for patterns in my record book at least 20 days a month  Your patient has identified these potential barriers to change:  None  Your patient has identified their diabetes self-care support plan as  daughter  friend

## 2013-03-19 ENCOUNTER — Encounter: Payer: Self-pay | Admitting: General Surgery

## 2013-03-19 DIAGNOSIS — I4510 Unspecified right bundle-branch block: Secondary | ICD-10-CM | POA: Insufficient documentation

## 2013-03-19 HISTORY — DX: Unspecified right bundle-branch block: I45.10

## 2013-03-23 ENCOUNTER — Encounter: Payer: Self-pay | Admitting: General Surgery

## 2013-03-23 ENCOUNTER — Ambulatory Visit (INDEPENDENT_AMBULATORY_CARE_PROVIDER_SITE_OTHER): Payer: Medicare Other | Admitting: Cardiology

## 2013-03-23 ENCOUNTER — Ambulatory Visit
Admission: RE | Admit: 2013-03-23 | Discharge: 2013-03-23 | Disposition: A | Discharge status: Home / Self Care | Payer: Medicare Other | Source: Ambulatory Visit | Attending: Family Medicine | Admitting: Family Medicine

## 2013-03-23 ENCOUNTER — Other Ambulatory Visit: Payer: Self-pay | Admitting: Family Medicine

## 2013-03-23 ENCOUNTER — Encounter: Payer: Self-pay | Admitting: Cardiology

## 2013-03-23 VITALS — BP 128/84 | HR 78 | Ht 62.0 in | Wt 212.0 lb

## 2013-03-23 DIAGNOSIS — R918 Other nonspecific abnormal finding of lung field: Secondary | ICD-10-CM | POA: Diagnosis not present

## 2013-03-23 DIAGNOSIS — I4510 Unspecified right bundle-branch block: Secondary | ICD-10-CM | POA: Diagnosis not present

## 2013-03-23 DIAGNOSIS — R9389 Abnormal findings on diagnostic imaging of other specified body structures: Secondary | ICD-10-CM

## 2013-03-23 DIAGNOSIS — C439 Malignant melanoma of skin, unspecified: Secondary | ICD-10-CM | POA: Diagnosis not present

## 2013-03-23 NOTE — Patient Instructions (Signed)
Your physician recommends that you continue on your current medications as directed. Please refer to the Current Medication list given to you today.  Your physician has requested that you have a lexiscan myoview. For further information please visit [email or web address]. Please follow instruction sheet, as given.  Your physician has requested that you have an echocardiogram. Echocardiography is a painless test that uses sound waves to create images of your heart. It provides your doctor with information about the size and shape of your heart and how well your heart's chambers and valves are working. This procedure takes approximately one hour. There are no restrictions for this procedure.  Your physician recommends that you schedule a follow-up appointment As Needed

## 2013-03-23 NOTE — Progress Notes (Signed)
[address], [place], [place]  [address] Phone: [phone] Fax:  [phone]  Date:  03/23/2013   ID:  Eunique, Balik [birth year], MRN [id number]  PCP:  Vidal Schwalbe, MD  Cardiologist:  Fransico Him, MD     History of Present Illness: Sarah Miller is a 68 y.o. female with a remote history of palpitations recently underwent routine PE and was found to have a new RBBB on EKG.  She was also just diagnosed with DM.  She denies any chest pain or pressure, SOB, DOE, LE edema, dizziness, palpitations, dizziness or syncope.   Wt Readings from Last 3 Encounters:  03/23/13 212 lb (96.163 kg)  03/04/13 214 lb 8 oz (97.297 kg)     Past Medical History  Diagnosis Date  . Sleep apnea   . Diabetes mellitus without complication   . Cervical arthritis     Dr Delman Kitten, S/P injection with good results.   . Frozen shoulder     H/O frozen shoulder and rotator cuff tendonititis, L s/p injection  . Early menopause   . Allergic rhinitis   . Cancer     h/o melanoma '78 and 79  . Vitamin D deficiency   . Depression     and anxiety    Current Outpatient Prescriptions  Medication Sig Dispense Refill  . buPROPion (WELLBUTRIN XL) 300 MG 24 hr tablet Take 300 mg by mouth daily.      . cyclobenzaprine (FLEXERIL) 10 MG tablet Take 10 mg by mouth as needed for muscle spasms (at night for muscle spasms).      Marland Kitchen FLUoxetine (PROZAC) 10 MG capsule Take 10 mg by mouth daily.      Marland Kitchen ibuprofen (ADVIL,MOTRIN) 800 MG tablet Take 800 mg by mouth every 8 (eight) hours as needed.       No current facility-administered medications for this visit.    Allergies:    Allergies  Allergen Reactions  . Penicillins   . Sertraline Hcl Diarrhea    Social History:  The patient  reports that she has been smoking Cigarettes.  She has been smoking about 0.00 packs per day. She does not have any smokeless tobacco history on file. She reports that she drinks alcohol. She reports that she  does not use illicit drugs.   Family History:  The patient's family history includes CAD in her brother, father, and paternal grandfather; Cancer in her father, mother, and other; Diabetes in her father; Heart attack in her father; Heart attack (age of onset: 28) in her brother; Heart disease in her brother, father, and other; Hyperlipidemia in her father and other; Hypertension in her father and sister.   ROS:  Please see the history of present illness.      All other systems reviewed and negative.   PHYSICAL EXAM: VS:  BP 128/84  Pulse 78  Ht 5\' 2"  (1.575 m)  Wt 212 lb (96.163 kg)  BMI 38.77 kg/m2 Well nourished, well developed, in no acute distress HEENT: normal Neck: no JVD Cardiac:  normal S1, S2; RRR; no murmur Lungs:  clear to auscultation bilaterally, no wheezing, rhonchi or rales Abd: soft, nontender, no hepatomegaly Ext: no edema Skin: warm and dry Neuro:  CNs 2-12 intact, no focal abnormalities noted  EKG:     NSR RBBB  ASSESSMENT AND PLAN:  1. RBBB  - 2D echo to assess LVF  - Lexiscan myoview  Followup with me PRN  Signed, Fransico Him, MD 03/23/2013  3:44 PM

## 2013-04-06 ENCOUNTER — Encounter (HOSPITAL_COMMUNITY): Payer: Medicare Other

## 2013-04-15 ENCOUNTER — Encounter (HOSPITAL_COMMUNITY): Payer: Medicare Other

## 2013-04-15 ENCOUNTER — Other Ambulatory Visit (HOSPITAL_COMMUNITY): Payer: Medicare Other

## 2013-04-16 ENCOUNTER — Other Ambulatory Visit: Payer: Self-pay | Admitting: Family Medicine

## 2013-04-16 DIAGNOSIS — R9389 Abnormal findings on diagnostic imaging of other specified body structures: Secondary | ICD-10-CM

## 2013-04-17 ENCOUNTER — Ambulatory Visit (HOSPITAL_BASED_OUTPATIENT_CLINIC_OR_DEPARTMENT_OTHER): Payer: Medicare Other | Admitting: Radiology

## 2013-04-17 ENCOUNTER — Ambulatory Visit (HOSPITAL_COMMUNITY): Payer: Medicare Other | Attending: Cardiology | Admitting: Radiology

## 2013-04-17 ENCOUNTER — Encounter: Payer: Self-pay | Admitting: Cardiology

## 2013-04-17 VITALS — BP 153/84 | HR 79 | Ht 62.0 in | Wt 208.0 lb

## 2013-04-17 DIAGNOSIS — Z8249 Family history of ischemic heart disease and other diseases of the circulatory system: Secondary | ICD-10-CM | POA: Diagnosis not present

## 2013-04-17 DIAGNOSIS — Z6838 Body mass index (BMI) 38.0-38.9, adult: Secondary | ICD-10-CM | POA: Diagnosis not present

## 2013-04-17 DIAGNOSIS — I452 Bifascicular block: Secondary | ICD-10-CM | POA: Insufficient documentation

## 2013-04-17 DIAGNOSIS — G4730 Sleep apnea, unspecified: Secondary | ICD-10-CM | POA: Insufficient documentation

## 2013-04-17 DIAGNOSIS — E119 Type 2 diabetes mellitus without complications: Secondary | ICD-10-CM | POA: Diagnosis not present

## 2013-04-17 DIAGNOSIS — I4510 Unspecified right bundle-branch block: Secondary | ICD-10-CM

## 2013-04-17 MED ORDER — TECHNETIUM TC 99M SESTAMIBI GENERIC - CARDIOLITE
33.0000 | Freq: Once | INTRAVENOUS | Status: AC | PRN
  Administered 2013-04-17: 33 via INTRAVENOUS

## 2013-04-17 MED ORDER — REGADENOSON 0.4 MG/5ML IV SOLN
0.4000 mg | Freq: Once | INTRAVENOUS | Status: AC
  Administered 2013-04-17: 0.4 mg via INTRAVENOUS

## 2013-04-17 MED ORDER — TECHNETIUM TC 99M SESTAMIBI GENERIC - CARDIOLITE
11.0000 | Freq: Once | INTRAVENOUS | Status: AC | PRN
  Administered 2013-04-17: 11 via INTRAVENOUS

## 2013-04-17 NOTE — Progress Notes (Signed)
Echocardiogram performed.  

## 2013-04-17 NOTE — Progress Notes (Signed)
[facility] [address] [place], [place] [address] [phone]    Cardiology Nuclear Med Study  Sarah Miller is a 68 y.o. female     MRN : [id number]     DOB: 1946-02-21  Procedure Date: 04/17/2013  Nuclear Med Background Indication for Stress Test:  Evaluation for Ischemia and Abnormal EKG History:  No known CAD, ?GXT 2007 (normal) Cardiac Risk Factors: Family History - CAD, NIDDM and Smoker  Symptoms:  None indicated   Nuclear Pre-Procedure Caffeine/Decaff Intake:  None NPO After: 8:30pm   Lungs:  clear O2 Sat: 96% on room air. IV 0.9% NS with Angio Cath:  22g  IV Site: R Hand  IV Started by:  Crissie Figures, RN  Chest Size (in):  42 Cup Size: C  Height: 5\' 2"  (1.575 m)  Weight:  208 lb (94.348 kg)  BMI:  Body mass index is 38.03 kg/(m^2). Tech Comments:  N/A    Nuclear Med Study 1 or 2 day study: 1 day  Stress Test Type:  Lexiscan  Reading MD: N/A  Order Authorizing Provider:  Tressia Miners Turner,MD  Resting Radionuclide: Technetium 3m Sestamibi  Resting Radionuclide Dose: 11.0 mCi   Stress Radionuclide:  Technetium 3m Sestamibi  Stress Radionuclide Dose: 33.0 mCi           Stress Protocol Rest HR: 79 Stress HR: 133  Rest BP: 153/84 Stress BP: 149/72  Exercise Time (min): n/a METS: n/a           Dose of Adenosine (mg):  n/a Dose of Lexiscan: 0.4 mg  Dose of Atropine (mg): n/a Dose of Dobutamine: n/a mcg/kg/min (at max HR)  Stress Test Technologist: Glade Lloyd, BS-ES  Nuclear Technologist:  Charlton Amor, CNMT     Rest Procedure:  Myocardial perfusion imaging was performed at rest 45 minutes following the intravenous administration of Technetium 67m Sestamibi. Rest ECG: NSR-RBBB  Stress Procedure:  The patient received IV Lexiscan 0.4 mg over 15-seconds with concurrent low level exercise and then Technetium 26m Sestamibi was injected at 30-seconds while the patient continued walking one more minute.  Quantitative spect images  were obtained after a 45-minute delay.  During the infusion of Lexiscan, the patient complained of feeling fatigued, SOB, nauseated and lightheaded.  These symptoms began to resolve in recovery.  Stress ECG: No significant ST segment change suggestive of ischemia.  QPS Raw Data Images:  Acquisition technically good; normal left ventricular size. Stress Images:  There is decreased uptake in the distal anterior wall. Rest Images:  There is decreased uptake in the distal anterior wall. Subtraction (SDS):  No evidence of ischemia. Transient Ischemic Dilatation (Normal <1.22):  1.00 Lung/Heart Ratio (Normal <0.45):  0.35  Quantitative Gated Spect Images QGS EDV:  67 ml QGS ESV:  22 ml  Impression Exercise Capacity:  Lexiscan with low level exercise. BP Response:  Normal blood pressure response. Clinical Symptoms:  There is dyspnea. ECG Impression:  No significant ST segment change suggestive of ischemia. Comparison with Prior Nuclear Study: No images to compare  Overall Impression:  Normal stress nuclear study with a small, mild intensity, fixed distal anterior defect most likely related to breast attenuation; no ischemia.  LV Ejection Fraction: 67%.  LV Wall Motion:  NL LV Function; NL Wall Motion  Kirk Ruths

## 2013-04-21 ENCOUNTER — Other Ambulatory Visit: Payer: Medicare Other

## 2013-04-24 ENCOUNTER — Ambulatory Visit
Admission: RE | Admit: 2013-04-24 | Discharge: 2013-04-24 | Disposition: A | Discharge status: Home / Self Care | Payer: Medicare Other | Source: Ambulatory Visit | Attending: Family Medicine | Admitting: Family Medicine

## 2013-04-24 DIAGNOSIS — Z1231 Encounter for screening mammogram for malignant neoplasm of breast: Secondary | ICD-10-CM | POA: Diagnosis not present

## 2013-04-24 DIAGNOSIS — R9389 Abnormal findings on diagnostic imaging of other specified body structures: Secondary | ICD-10-CM

## 2013-06-29 ENCOUNTER — Ambulatory Visit: Payer: Medicare Other

## 2013-12-25 DIAGNOSIS — Z23 Encounter for immunization: Secondary | ICD-10-CM | POA: Diagnosis not present

## 2013-12-25 DIAGNOSIS — B351 Tinea unguium: Secondary | ICD-10-CM | POA: Diagnosis not present

## 2013-12-25 DIAGNOSIS — M1990 Unspecified osteoarthritis, unspecified site: Secondary | ICD-10-CM | POA: Diagnosis not present

## 2013-12-25 DIAGNOSIS — F3341 Major depressive disorder, recurrent, in partial remission: Secondary | ICD-10-CM | POA: Diagnosis not present

## 2013-12-25 DIAGNOSIS — Z1211 Encounter for screening for malignant neoplasm of colon: Secondary | ICD-10-CM | POA: Diagnosis not present

## 2013-12-25 DIAGNOSIS — E119 Type 2 diabetes mellitus without complications: Secondary | ICD-10-CM | POA: Diagnosis not present

## 2013-12-25 DIAGNOSIS — E559 Vitamin D deficiency, unspecified: Secondary | ICD-10-CM | POA: Diagnosis not present

## 2013-12-25 DIAGNOSIS — F17200 Nicotine dependence, unspecified, uncomplicated: Secondary | ICD-10-CM | POA: Diagnosis not present

## 2014-02-03 DIAGNOSIS — H2513 Age-related nuclear cataract, bilateral: Secondary | ICD-10-CM | POA: Diagnosis not present

## 2014-02-03 DIAGNOSIS — E119 Type 2 diabetes mellitus without complications: Secondary | ICD-10-CM | POA: Diagnosis not present

## 2014-02-03 DIAGNOSIS — H43813 Vitreous degeneration, bilateral: Secondary | ICD-10-CM | POA: Diagnosis not present

## 2014-02-12 DIAGNOSIS — K5730 Diverticulosis of large intestine without perforation or abscess without bleeding: Secondary | ICD-10-CM | POA: Diagnosis not present

## 2014-02-12 DIAGNOSIS — Z1211 Encounter for screening for malignant neoplasm of colon: Secondary | ICD-10-CM | POA: Diagnosis not present

## 2014-04-02 DIAGNOSIS — J069 Acute upper respiratory infection, unspecified: Secondary | ICD-10-CM | POA: Diagnosis not present

## 2014-04-02 DIAGNOSIS — N951 Menopausal and female climacteric states: Secondary | ICD-10-CM | POA: Diagnosis not present

## 2014-04-02 DIAGNOSIS — F3341 Major depressive disorder, recurrent, in partial remission: Secondary | ICD-10-CM | POA: Diagnosis not present

## 2014-04-02 DIAGNOSIS — F419 Anxiety disorder, unspecified: Secondary | ICD-10-CM | POA: Diagnosis not present

## 2014-04-19 DIAGNOSIS — M859 Disorder of bone density and structure, unspecified: Secondary | ICD-10-CM | POA: Diagnosis not present

## 2014-04-19 DIAGNOSIS — M8580 Other specified disorders of bone density and structure, unspecified site: Secondary | ICD-10-CM | POA: Diagnosis not present

## 2014-05-05 ENCOUNTER — Other Ambulatory Visit: Payer: Self-pay | Admitting: Family Medicine

## 2014-05-05 ENCOUNTER — Ambulatory Visit
Admission: RE | Admit: 2014-05-05 | Discharge: 2014-05-05 | Disposition: A | Discharge status: Home / Self Care | Payer: Medicare Other | Source: Ambulatory Visit | Attending: Family Medicine | Admitting: Family Medicine

## 2014-05-05 DIAGNOSIS — L821 Other seborrheic keratosis: Secondary | ICD-10-CM | POA: Diagnosis not present

## 2014-05-05 DIAGNOSIS — M25552 Pain in left hip: Secondary | ICD-10-CM

## 2014-05-05 DIAGNOSIS — M1612 Unilateral primary osteoarthritis, left hip: Secondary | ICD-10-CM | POA: Diagnosis not present

## 2014-05-05 DIAGNOSIS — M79641 Pain in right hand: Secondary | ICD-10-CM | POA: Diagnosis not present

## 2014-05-05 DIAGNOSIS — Z85820 Personal history of malignant melanoma of skin: Secondary | ICD-10-CM | POA: Diagnosis not present

## 2014-05-05 DIAGNOSIS — F3341 Major depressive disorder, recurrent, in partial remission: Secondary | ICD-10-CM | POA: Diagnosis not present

## 2014-05-05 DIAGNOSIS — Z23 Encounter for immunization: Secondary | ICD-10-CM | POA: Diagnosis not present

## 2014-05-05 DIAGNOSIS — M79642 Pain in left hand: Secondary | ICD-10-CM

## 2014-05-05 DIAGNOSIS — M7989 Other specified soft tissue disorders: Secondary | ICD-10-CM | POA: Diagnosis not present

## 2014-05-05 DIAGNOSIS — M19042 Primary osteoarthritis, left hand: Secondary | ICD-10-CM | POA: Diagnosis not present

## 2014-05-10 DIAGNOSIS — M2550 Pain in unspecified joint: Secondary | ICD-10-CM | POA: Diagnosis not present

## 2014-05-10 DIAGNOSIS — M7062 Trochanteric bursitis, left hip: Secondary | ICD-10-CM | POA: Diagnosis not present

## 2014-06-07 DIAGNOSIS — M7062 Trochanteric bursitis, left hip: Secondary | ICD-10-CM | POA: Diagnosis not present

## 2014-06-22 DIAGNOSIS — M79643 Pain in unspecified hand: Secondary | ICD-10-CM | POA: Diagnosis not present

## 2014-06-22 DIAGNOSIS — M19049 Primary osteoarthritis, unspecified hand: Secondary | ICD-10-CM | POA: Diagnosis not present

## 2014-06-22 DIAGNOSIS — M2550 Pain in unspecified joint: Secondary | ICD-10-CM | POA: Diagnosis not present

## 2014-07-06 DIAGNOSIS — M19049 Primary osteoarthritis, unspecified hand: Secondary | ICD-10-CM | POA: Diagnosis not present

## 2014-07-06 DIAGNOSIS — M79643 Pain in unspecified hand: Secondary | ICD-10-CM | POA: Diagnosis not present

## 2014-07-06 DIAGNOSIS — M2550 Pain in unspecified joint: Secondary | ICD-10-CM | POA: Diagnosis not present

## 2014-08-05 DIAGNOSIS — E559 Vitamin D deficiency, unspecified: Secondary | ICD-10-CM | POA: Diagnosis not present

## 2014-08-05 DIAGNOSIS — E119 Type 2 diabetes mellitus without complications: Secondary | ICD-10-CM | POA: Diagnosis not present

## 2014-08-05 DIAGNOSIS — F3341 Major depressive disorder, recurrent, in partial remission: Secondary | ICD-10-CM | POA: Diagnosis not present

## 2014-08-26 DIAGNOSIS — L814 Other melanin hyperpigmentation: Secondary | ICD-10-CM | POA: Diagnosis not present

## 2014-08-26 DIAGNOSIS — L570 Actinic keratosis: Secondary | ICD-10-CM | POA: Diagnosis not present

## 2014-08-26 DIAGNOSIS — L821 Other seborrheic keratosis: Secondary | ICD-10-CM | POA: Diagnosis not present

## 2014-08-26 DIAGNOSIS — L738 Other specified follicular disorders: Secondary | ICD-10-CM | POA: Diagnosis not present

## 2014-08-26 DIAGNOSIS — Z85820 Personal history of malignant melanoma of skin: Secondary | ICD-10-CM | POA: Diagnosis not present

## 2014-08-26 DIAGNOSIS — D1801 Hemangioma of skin and subcutaneous tissue: Secondary | ICD-10-CM | POA: Diagnosis not present

## 2014-08-26 DIAGNOSIS — L820 Inflamed seborrheic keratosis: Secondary | ICD-10-CM | POA: Diagnosis not present

## 2014-11-08 DIAGNOSIS — H04201 Unspecified epiphora, right lacrimal gland: Secondary | ICD-10-CM | POA: Diagnosis not present

## 2014-11-08 DIAGNOSIS — E119 Type 2 diabetes mellitus without complications: Secondary | ICD-10-CM | POA: Diagnosis not present

## 2014-11-08 DIAGNOSIS — G510 Bell's palsy: Secondary | ICD-10-CM | POA: Diagnosis not present

## 2015-03-28 DIAGNOSIS — E119 Type 2 diabetes mellitus without complications: Secondary | ICD-10-CM | POA: Diagnosis not present

## 2015-03-28 DIAGNOSIS — H5213 Myopia, bilateral: Secondary | ICD-10-CM | POA: Diagnosis not present

## 2015-03-28 DIAGNOSIS — H2513 Age-related nuclear cataract, bilateral: Secondary | ICD-10-CM | POA: Diagnosis not present

## 2015-04-28 DIAGNOSIS — R197 Diarrhea, unspecified: Secondary | ICD-10-CM | POA: Diagnosis not present

## 2015-04-28 DIAGNOSIS — E119 Type 2 diabetes mellitus without complications: Secondary | ICD-10-CM | POA: Diagnosis not present

## 2015-04-28 DIAGNOSIS — Z23 Encounter for immunization: Secondary | ICD-10-CM | POA: Diagnosis not present

## 2015-04-28 DIAGNOSIS — E559 Vitamin D deficiency, unspecified: Secondary | ICD-10-CM | POA: Diagnosis not present

## 2015-04-28 DIAGNOSIS — Z7984 Long term (current) use of oral hypoglycemic drugs: Secondary | ICD-10-CM | POA: Diagnosis not present

## 2015-04-28 DIAGNOSIS — F324 Major depressive disorder, single episode, in partial remission: Secondary | ICD-10-CM | POA: Diagnosis not present

## 2015-04-28 DIAGNOSIS — G4730 Sleep apnea, unspecified: Secondary | ICD-10-CM | POA: Diagnosis not present

## 2015-06-15 DIAGNOSIS — G4733 Obstructive sleep apnea (adult) (pediatric): Secondary | ICD-10-CM | POA: Diagnosis not present

## 2015-08-11 DIAGNOSIS — Z0000 Encounter for general adult medical examination without abnormal findings: Secondary | ICD-10-CM | POA: Diagnosis not present

## 2015-08-11 DIAGNOSIS — E119 Type 2 diabetes mellitus without complications: Secondary | ICD-10-CM | POA: Diagnosis not present

## 2015-08-11 DIAGNOSIS — E559 Vitamin D deficiency, unspecified: Secondary | ICD-10-CM | POA: Diagnosis not present

## 2015-08-11 DIAGNOSIS — Z79899 Other long term (current) drug therapy: Secondary | ICD-10-CM | POA: Diagnosis not present

## 2015-08-11 DIAGNOSIS — Z1239 Encounter for other screening for malignant neoplasm of breast: Secondary | ICD-10-CM | POA: Diagnosis not present

## 2015-08-11 DIAGNOSIS — F3341 Major depressive disorder, recurrent, in partial remission: Secondary | ICD-10-CM | POA: Diagnosis not present

## 2015-08-11 DIAGNOSIS — G4733 Obstructive sleep apnea (adult) (pediatric): Secondary | ICD-10-CM | POA: Diagnosis not present

## 2015-11-24 DIAGNOSIS — Z7984 Long term (current) use of oral hypoglycemic drugs: Secondary | ICD-10-CM | POA: Diagnosis not present

## 2015-11-24 DIAGNOSIS — Z6841 Body Mass Index (BMI) 40.0 and over, adult: Secondary | ICD-10-CM | POA: Diagnosis not present

## 2015-11-24 DIAGNOSIS — Z23 Encounter for immunization: Secondary | ICD-10-CM | POA: Diagnosis not present

## 2015-11-24 DIAGNOSIS — E119 Type 2 diabetes mellitus without complications: Secondary | ICD-10-CM | POA: Diagnosis not present

## 2015-11-24 DIAGNOSIS — Z1231 Encounter for screening mammogram for malignant neoplasm of breast: Secondary | ICD-10-CM | POA: Diagnosis not present

## 2015-11-24 DIAGNOSIS — F3341 Major depressive disorder, recurrent, in partial remission: Secondary | ICD-10-CM | POA: Diagnosis not present

## 2015-11-24 DIAGNOSIS — F17200 Nicotine dependence, unspecified, uncomplicated: Secondary | ICD-10-CM | POA: Diagnosis not present

## 2016-01-05 DIAGNOSIS — H52223 Regular astigmatism, bilateral: Secondary | ICD-10-CM | POA: Diagnosis not present

## 2016-01-05 DIAGNOSIS — E119 Type 2 diabetes mellitus without complications: Secondary | ICD-10-CM | POA: Diagnosis not present

## 2016-01-05 DIAGNOSIS — H2513 Age-related nuclear cataract, bilateral: Secondary | ICD-10-CM | POA: Diagnosis not present

## 2016-01-05 DIAGNOSIS — H524 Presbyopia: Secondary | ICD-10-CM | POA: Diagnosis not present

## 2016-01-05 DIAGNOSIS — H5213 Myopia, bilateral: Secondary | ICD-10-CM | POA: Diagnosis not present

## 2016-02-06 DIAGNOSIS — E119 Type 2 diabetes mellitus without complications: Secondary | ICD-10-CM | POA: Diagnosis not present

## 2016-02-06 DIAGNOSIS — H43813 Vitreous degeneration, bilateral: Secondary | ICD-10-CM | POA: Diagnosis not present

## 2016-02-06 DIAGNOSIS — H2513 Age-related nuclear cataract, bilateral: Secondary | ICD-10-CM | POA: Diagnosis not present

## 2016-02-09 DIAGNOSIS — H2511 Age-related nuclear cataract, right eye: Secondary | ICD-10-CM | POA: Diagnosis not present

## 2016-02-16 DIAGNOSIS — H2512 Age-related nuclear cataract, left eye: Secondary | ICD-10-CM | POA: Diagnosis not present

## 2016-02-23 DIAGNOSIS — H2512 Age-related nuclear cataract, left eye: Secondary | ICD-10-CM | POA: Diagnosis not present

## 2016-06-21 DIAGNOSIS — R05 Cough: Secondary | ICD-10-CM | POA: Diagnosis not present

## 2016-06-21 DIAGNOSIS — J0110 Acute frontal sinusitis, unspecified: Secondary | ICD-10-CM | POA: Diagnosis not present

## 2016-09-03 DIAGNOSIS — H04123 Dry eye syndrome of bilateral lacrimal glands: Secondary | ICD-10-CM | POA: Diagnosis not present

## 2016-09-06 DIAGNOSIS — G4733 Obstructive sleep apnea (adult) (pediatric): Secondary | ICD-10-CM | POA: Diagnosis not present

## 2016-09-20 DIAGNOSIS — F3341 Major depressive disorder, recurrent, in partial remission: Secondary | ICD-10-CM | POA: Diagnosis not present

## 2016-09-20 DIAGNOSIS — E119 Type 2 diabetes mellitus without complications: Secondary | ICD-10-CM | POA: Diagnosis not present

## 2016-09-20 DIAGNOSIS — N393 Stress incontinence (female) (male): Secondary | ICD-10-CM | POA: Diagnosis not present

## 2016-09-20 DIAGNOSIS — B372 Candidiasis of skin and nail: Secondary | ICD-10-CM | POA: Diagnosis not present

## 2016-09-20 DIAGNOSIS — Z7984 Long term (current) use of oral hypoglycemic drugs: Secondary | ICD-10-CM | POA: Diagnosis not present

## 2016-09-20 DIAGNOSIS — F17200 Nicotine dependence, unspecified, uncomplicated: Secondary | ICD-10-CM | POA: Diagnosis not present

## 2016-11-08 ENCOUNTER — Other Ambulatory Visit: Payer: Self-pay | Admitting: Acute Care

## 2016-11-08 DIAGNOSIS — Z122 Encounter for screening for malignant neoplasm of respiratory organs: Secondary | ICD-10-CM

## 2016-11-08 DIAGNOSIS — F17210 Nicotine dependence, cigarettes, uncomplicated: Secondary | ICD-10-CM

## 2016-11-12 DIAGNOSIS — F324 Major depressive disorder, single episode, in partial remission: Secondary | ICD-10-CM | POA: Diagnosis not present

## 2016-11-12 DIAGNOSIS — Z23 Encounter for immunization: Secondary | ICD-10-CM | POA: Diagnosis not present

## 2016-11-16 ENCOUNTER — Ambulatory Visit (INDEPENDENT_AMBULATORY_CARE_PROVIDER_SITE_OTHER): Payer: Medicare Other | Admitting: Acute Care

## 2016-11-16 ENCOUNTER — Encounter: Payer: Self-pay | Admitting: Acute Care

## 2016-11-16 ENCOUNTER — Ambulatory Visit (INDEPENDENT_AMBULATORY_CARE_PROVIDER_SITE_OTHER)
Admission: RE | Admit: 2016-11-16 | Discharge: 2016-11-16 | Disposition: A | Discharge status: Home / Self Care | Payer: Medicare Other | Source: Ambulatory Visit | Attending: Acute Care | Admitting: Acute Care

## 2016-11-16 DIAGNOSIS — F17210 Nicotine dependence, cigarettes, uncomplicated: Secondary | ICD-10-CM | POA: Diagnosis not present

## 2016-11-16 DIAGNOSIS — Z122 Encounter for screening for malignant neoplasm of respiratory organs: Secondary | ICD-10-CM

## 2016-11-16 DIAGNOSIS — Z87891 Personal history of nicotine dependence: Secondary | ICD-10-CM

## 2016-11-16 NOTE — Progress Notes (Signed)
Shared Decision Making Visit Lung Cancer Screening Program [phone])   Eligibility:  Age 71 y.o.  Pack Years Smoking History Calculation 39.75 -pack-year smoking history (# packs/per year x # years smoked)  Recent History of coughing up blood  no  Unexplained weight loss? no ( >Than 15 pounds within the last 6 months )  Prior History Lung / other cancer no (Diagnosis within the last 5 years already requiring surveillance chest CT Scans).  Smoking Status Current Smoker  Former Smokers: Years since quit:  Quit Date: Not applicable  Visit Components:  Discussion included one or more decision making aids. yes  Discussion included risk/benefits of screening. yes  Discussion included potential follow up diagnostic testing for abnormal scans. yes  Discussion included meaning and risk of over diagnosis. yes  Discussion included meaning and risk of False Positives. yes  Discussion included meaning of total radiation exposure. yes  Counseling Included:  Importance of adherence to annual lung cancer LDCT screening. yes  Impact of comorbidities on ability to participate in the program. yes  Ability and willingness to under diagnostic treatment. yes  Smoking Cessation Counseling:  Current Smokers:   Discussed importance of smoking cessation. yes  Information about tobacco cessation classes and interventions provided to patient. yes  Patient provided with "ticket" for LDCT Scan. yes  Symptomatic Patient. no  Counseling: Not applicable  Diagnosis Code: Tobacco Use Z72.0  Asymptomatic Patient yes  Counseling (Intermediate counseling: > three minutes counseling) D3220  Former Smokers:   Discussed the importance of maintaining cigarette abstinence. yes  Diagnosis Code: Personal History of Nicotine Dependence. U54.270  Information about tobacco cessation classes and interventions provided to patient. Yes  Patient provided with "ticket" for LDCT Scan. yes  Written  Order for Lung Cancer Screening with LDCT placed in Epic. Yes (CT Chest Lung Cancer Screening Low Dose W/O CM) [id number] Z12.2-Screening of respiratory organs Z87.891-Personal history of nicotine dependence  I have spent 25 minutes of face to face time with Sarah Miller discussing the risks and benefits of lung cancer screening. We viewed a power point together that explained in detail the above noted topics. We paused at intervals to allow for questions to be asked and answered to ensure understanding.We discussed that the single most powerful action that she can take to decrease her risk of developing lung cancer is to quit smoking. We discussed whether or not she is ready to commit to setting a quit date. She is currently not ready to set a quit date We discussed options for tools to aid in quitting smoking including nicotine replacement therapy, non-nicotine medications, support groups, Quit Smart classes, and behavior modification. We discussed that often times setting smaller, more achievable goals, such as eliminating 1 cigarette a day for a week and then 2 cigarettes a day for a week can be helpful in slowly decreasing the number of cigarettes smoked. This allows for a sense of accomplishment as well as providing a clinical benefit. I gave her the " Be Stronger Than Your Excuses" card with contact information for community resources, classes, free nicotine replacement therapy, and access to mobile apps, text messaging, and on-line smoking cessation help. I have also given her my card and contact information in the event she needs to contact me. We discussed the time and location of the scan, and that either Doroteo Glassman RN or I will call with the results within 24-48 hours of receiving them. I have offered her  a copy of the power point we viewed  as a resource in the event they need reinforcement of the concepts we discussed today in the office. The patient verbalized understanding of all of  the above  and had no further questions upon leaving the office. They have my contact information in the event they have any further questions.  I spent 4 minutes counseling on smoking cessation and the health risks of continued tobacco abuse.  I explained to the patient that there has been a high incidence of coronary artery disease noted on these exams. I explained that this is a non-gated exam therefore degree or severity cannot be determined. This patient is not on statin therapy. I have asked the patient to follow-up with their PCP regarding any incidental finding of coronary artery disease and management with diet or medication as their PCP  feels is clinically indicated. The patient verbalized understanding of the above and had no further questions upon completion of the visit.      Sarah Spatz, NP 11/16/2016

## 2016-11-20 ENCOUNTER — Other Ambulatory Visit: Payer: Self-pay | Admitting: Acute Care

## 2016-11-20 DIAGNOSIS — Z87891 Personal history of nicotine dependence: Secondary | ICD-10-CM

## 2016-12-19 DIAGNOSIS — G4733 Obstructive sleep apnea (adult) (pediatric): Secondary | ICD-10-CM | POA: Diagnosis not present

## 2016-12-19 DIAGNOSIS — R351 Nocturia: Secondary | ICD-10-CM | POA: Diagnosis not present

## 2016-12-19 DIAGNOSIS — N3946 Mixed incontinence: Secondary | ICD-10-CM | POA: Diagnosis not present

## 2016-12-19 DIAGNOSIS — Z6841 Body Mass Index (BMI) 40.0 and over, adult: Secondary | ICD-10-CM | POA: Diagnosis not present

## 2016-12-24 DIAGNOSIS — G4733 Obstructive sleep apnea (adult) (pediatric): Secondary | ICD-10-CM | POA: Diagnosis not present

## 2017-02-06 DIAGNOSIS — Z6841 Body Mass Index (BMI) 40.0 and over, adult: Secondary | ICD-10-CM | POA: Diagnosis not present

## 2017-02-06 DIAGNOSIS — N3642 Intrinsic sphincter deficiency (ISD): Secondary | ICD-10-CM | POA: Insufficient documentation

## 2017-02-06 DIAGNOSIS — N3946 Mixed incontinence: Secondary | ICD-10-CM | POA: Diagnosis not present

## 2017-03-04 DIAGNOSIS — Z9989 Dependence on other enabling machines and devices: Secondary | ICD-10-CM | POA: Diagnosis not present

## 2017-03-04 DIAGNOSIS — I10 Essential (primary) hypertension: Secondary | ICD-10-CM | POA: Diagnosis not present

## 2017-03-04 DIAGNOSIS — G4730 Sleep apnea, unspecified: Secondary | ICD-10-CM | POA: Diagnosis not present

## 2017-03-04 DIAGNOSIS — Z794 Long term (current) use of insulin: Secondary | ICD-10-CM | POA: Diagnosis not present

## 2017-03-04 DIAGNOSIS — Z880 Allergy status to penicillin: Secondary | ICD-10-CM | POA: Diagnosis not present

## 2017-03-04 DIAGNOSIS — N393 Stress incontinence (female) (male): Secondary | ICD-10-CM | POA: Diagnosis not present

## 2017-03-04 DIAGNOSIS — F329 Major depressive disorder, single episode, unspecified: Secondary | ICD-10-CM | POA: Diagnosis not present

## 2017-03-04 DIAGNOSIS — F419 Anxiety disorder, unspecified: Secondary | ICD-10-CM | POA: Diagnosis not present

## 2017-03-04 DIAGNOSIS — N3946 Mixed incontinence: Secondary | ICD-10-CM | POA: Diagnosis not present

## 2017-03-04 DIAGNOSIS — N3642 Intrinsic sphincter deficiency (ISD): Secondary | ICD-10-CM | POA: Diagnosis not present

## 2017-03-04 DIAGNOSIS — Z79899 Other long term (current) drug therapy: Secondary | ICD-10-CM | POA: Diagnosis not present

## 2017-03-04 DIAGNOSIS — E119 Type 2 diabetes mellitus without complications: Secondary | ICD-10-CM | POA: Diagnosis not present

## 2017-03-04 DIAGNOSIS — Z7984 Long term (current) use of oral hypoglycemic drugs: Secondary | ICD-10-CM | POA: Diagnosis not present

## 2017-04-01 DIAGNOSIS — F3341 Major depressive disorder, recurrent, in partial remission: Secondary | ICD-10-CM | POA: Diagnosis not present

## 2017-04-01 DIAGNOSIS — I700 Atherosclerosis of aorta: Secondary | ICD-10-CM | POA: Diagnosis not present

## 2017-04-01 DIAGNOSIS — F17200 Nicotine dependence, unspecified, uncomplicated: Secondary | ICD-10-CM | POA: Diagnosis not present

## 2017-04-01 DIAGNOSIS — E119 Type 2 diabetes mellitus without complications: Secondary | ICD-10-CM | POA: Diagnosis not present

## 2017-04-01 DIAGNOSIS — Z0000 Encounter for general adult medical examination without abnormal findings: Secondary | ICD-10-CM | POA: Diagnosis not present

## 2017-04-01 DIAGNOSIS — Z20828 Contact with and (suspected) exposure to other viral communicable diseases: Secondary | ICD-10-CM | POA: Diagnosis not present

## 2017-04-08 DIAGNOSIS — Z1231 Encounter for screening mammogram for malignant neoplasm of breast: Secondary | ICD-10-CM | POA: Diagnosis not present

## 2017-05-13 ENCOUNTER — Other Ambulatory Visit: Payer: Self-pay | Admitting: Family Medicine

## 2017-05-13 DIAGNOSIS — R911 Solitary pulmonary nodule: Secondary | ICD-10-CM

## 2017-05-29 ENCOUNTER — Ambulatory Visit
Admission: RE | Admit: 2017-05-29 | Discharge: 2017-05-29 | Disposition: A | Discharge status: Home / Self Care | Payer: Medicare Other | Source: Ambulatory Visit | Attending: Family Medicine | Admitting: Family Medicine

## 2017-05-29 DIAGNOSIS — J439 Emphysema, unspecified: Secondary | ICD-10-CM | POA: Diagnosis not present

## 2017-05-29 DIAGNOSIS — R911 Solitary pulmonary nodule: Secondary | ICD-10-CM

## 2017-06-06 DIAGNOSIS — E119 Type 2 diabetes mellitus without complications: Secondary | ICD-10-CM | POA: Diagnosis not present

## 2017-06-28 ENCOUNTER — Telehealth: Payer: Self-pay | Admitting: Acute Care

## 2017-06-28 DIAGNOSIS — Z122 Encounter for screening for malignant neoplasm of respiratory organs: Secondary | ICD-10-CM

## 2017-06-28 DIAGNOSIS — Z87891 Personal history of nicotine dependence: Secondary | ICD-10-CM

## 2017-06-28 NOTE — Telephone Encounter (Signed)
Spoke with pt to make sure she was aware of CT results that were ordered by Dr white.  Pt states she was made aware of the results.  I advised pt that I would order a f/u LDCT in 1 year.  Pt verbalized understanding.  Nothing further needed a this time.

## 2017-11-14 ENCOUNTER — Emergency Department (HOSPITAL_COMMUNITY)
Admission: EM | Admit: 2017-11-14 | Discharge: 2017-11-14 | Disposition: A | Discharge status: Home / Self Care | Payer: Medicare Other | Attending: Emergency Medicine | Admitting: Emergency Medicine

## 2017-11-14 ENCOUNTER — Encounter (HOSPITAL_COMMUNITY): Payer: Self-pay

## 2017-11-14 ENCOUNTER — Emergency Department (HOSPITAL_COMMUNITY): Payer: Medicare Other

## 2017-11-14 ENCOUNTER — Other Ambulatory Visit: Payer: Self-pay

## 2017-11-14 DIAGNOSIS — Z79899 Other long term (current) drug therapy: Secondary | ICD-10-CM | POA: Insufficient documentation

## 2017-11-14 DIAGNOSIS — E119 Type 2 diabetes mellitus without complications: Secondary | ICD-10-CM | POA: Insufficient documentation

## 2017-11-14 DIAGNOSIS — K219 Gastro-esophageal reflux disease without esophagitis: Secondary | ICD-10-CM | POA: Insufficient documentation

## 2017-11-14 DIAGNOSIS — F17210 Nicotine dependence, cigarettes, uncomplicated: Secondary | ICD-10-CM | POA: Insufficient documentation

## 2017-11-14 DIAGNOSIS — R0789 Other chest pain: Secondary | ICD-10-CM

## 2017-11-14 DIAGNOSIS — Z85820 Personal history of malignant melanoma of skin: Secondary | ICD-10-CM | POA: Diagnosis not present

## 2017-11-14 DIAGNOSIS — R079 Chest pain, unspecified: Secondary | ICD-10-CM | POA: Diagnosis not present

## 2017-11-14 LAB — CBC
HCT: 38.2 % (ref 36.0–46.0)
Hemoglobin: 11.7 g/dL — ABNORMAL LOW (ref 12.0–15.0)
MCH: 26.8 pg (ref 26.0–34.0)
MCHC: 30.6 g/dL (ref 30.0–36.0)
MCV: 87.6 fL (ref 78.0–100.0)
Platelets: 270 10*3/uL (ref 150–400)
RBC: 4.36 MIL/uL (ref 3.87–5.11)
RDW: 14.7 % (ref 11.5–15.5)
WBC: 9.9 10*3/uL (ref 4.0–10.5)

## 2017-11-14 LAB — BASIC METABOLIC PANEL
Anion gap: 13 (ref 5–15)
BUN: 12 mg/dL (ref 8–23)
CO2: 28 mmol/L (ref 22–32)
Calcium: 9 mg/dL (ref 8.9–10.3)
Chloride: 100 mmol/L (ref 98–111)
Creatinine, Ser: 0.87 mg/dL (ref 0.44–1.00)
GFR calc Af Amer: >60 mL/min (ref >60)
GFR calc non Af Amer: >60 mL/min (ref >60)
Glucose, Bld: 129 mg/dL — ABNORMAL HIGH (ref 70–99)
Potassium: 3.8 mmol/L (ref 3.5–5.1)
Sodium: 141 mmol/L (ref 135–145)

## 2017-11-14 LAB — I-STAT TROPONIN, ED
Troponin i, poc: 0 ng/mL (ref 0.00–0.08)
Troponin i, poc: 0.01 ng/mL (ref 0.00–0.08)

## 2017-11-14 MED ORDER — GI COCKTAIL ~~LOC~~: 30.0000 mL | Freq: Two times a day (BID) | ORAL | 1 refills | Status: DC

## 2017-11-14 MED ORDER — GI COCKTAIL ~~LOC~~
30.0000 mL | Freq: Once | ORAL | Status: AC
  Administered 2017-11-14: 30 mL via ORAL
  Filled 2017-11-14: qty 30

## 2017-11-14 MED ORDER — PANTOPRAZOLE SODIUM 40 MG PO TBEC: 40.0000 mg | DELAYED_RELEASE_TABLET | Freq: Every day | ORAL | 0 refills | Status: DC

## 2017-11-14 MED ORDER — PANTOPRAZOLE SODIUM 40 MG PO TBEC
40.0000 mg | DELAYED_RELEASE_TABLET | Freq: Once | ORAL | Status: AC
  Administered 2017-11-14: 40 mg via ORAL
  Filled 2017-11-14: qty 1

## 2017-11-14 NOTE — Discharge Instructions (Addendum)
Your cardiac labs were normal today.  Please follow-up closely with your primary care physician.  You may continue over-the-counter Tums, Mylanta, Zantac as needed for heartburn.  Prescription for Protonix and GI cocktail

## 2017-11-14 NOTE — ED Provider Notes (Signed)
TIME SEEN: 4:42 AM  CHIEF COMPLAINT: Chest pain  HPI: Patient is a 72 year old female with history of diabetes, hyperlipidemia, previous tobacco use who quit 6 months ago who presents to the emergency department with chest pain.  Chest pain intermittent for the past 6 days.  Worse after eating and better with Mylanta.  Described as a sharp, burning pain and feels like indigestion.  No associated shortness of breath or dizziness or diaphoresis.  Has had some nausea but no vomiting.  No bloody stool or melena.  States initially pain was radiating from her abdomen into her chest.  No abdominal pain currently.  States Mylanta was initially helping her pain but now it is no longer helping as much as it was in her PCP suggested she come to the emergency department.  Last stress test was years ago.  No history of cardiac catheterization or stents.  ROS: See HPI Constitutional: no fever  Eyes: no drainage  ENT: no runny nose   Cardiovascular:   chest pain  Resp: no SOB  GI: no vomiting GU: no dysuria Integumentary: no rash  Allergy: no hives  Musculoskeletal: no leg swelling  Neurological: no slurred speech ROS otherwise negative  PAST MEDICAL HISTORY/PAST SURGICAL HISTORY:  Past Medical History:  Diagnosis Date  . Allergic rhinitis   . Cancer [facility])    h/o melanoma '78 and 79  . Cervical arthritis    Dr Delman Kitten, S/P injection with good results.   . Depression    and anxiety  . Diabetes mellitus without complication ([place])   . Early menopause   . Frozen shoulder    H/O frozen shoulder and rotator cuff tendonititis, L s/p injection  . Sleep apnea   . Vitamin D deficiency     MEDICATIONS:  Prior to Admission medications   Medication Sig Start Date End Date Taking? Authorizing Provider  buPROPion (WELLBUTRIN XL) 300 MG 24 hr tablet Take 300 mg by mouth daily.    [provider]  cyclobenzaprine (FLEXERIL) 10 MG tablet Take 10 mg by mouth as needed for muscle spasms (at  night for muscle spasms).    [provider]  FLUoxetine (PROZAC) 10 MG capsule Take 10 mg by mouth daily.    [provider]  ibuprofen (ADVIL,MOTRIN) 800 MG tablet Take 800 mg by mouth every 8 (eight) hours as needed.    [provider]    ALLERGIES:  Allergies  Allergen Reactions  . Penicillins   . Sertraline Hcl Diarrhea    SOCIAL HISTORY:  Social History   Tobacco Use  . Smoking status: Current Every Day Smoker    Packs/day: 0.75    Years: 53.00    Pack years: 39.75    Types: Cigarettes  . Smokeless tobacco: Never Used  Substance Use Topics  . Alcohol use: Yes    Comment: rare    FAMILY HISTORY: Family History  Problem Relation Age of Onset  . Cancer Mother        ovarian cancer  . Hypertension Father   . Diabetes Father   . Heart attack Father   . Cancer Father        testicular  . CAD Father   . Heart disease Father   . Hyperlipidemia Father   . Heart attack Brother 25  . CAD Brother   . Heart disease Brother   . CAD Paternal Grandfather   . Hypertension Sister   . Cancer Other   . Hyperlipidemia Other   .  Heart disease Other     EXAM: BP (!) 181/83 (BP Location: Right Arm)   Pulse (!) 103   Temp 97.6 F (36.4 C) (Oral)   Resp 17   Ht 5\' 2"  (1.575 m)   Wt 104.8 kg   SpO2 96%   BMI 42.25 kg/m  CONSTITUTIONAL: Alert and oriented and responds appropriately to questions. Well-appearing; well-nourished HEAD: Normocephalic EYES: Conjunctivae clear, pupils appear equal, EOMI ENT: normal nose; moist mucous membranes NECK: Supple, no meningismus, no nuchal rigidity, no LAD  CARD: RRR; S1 and S2 appreciated; no murmurs, no clicks, no rubs, no gallops RESP: Normal chest excursion without splinting or tachypnea; breath sounds clear and equal bilaterally; no wheezes, no rhonchi, no rales, no hypoxia or respiratory distress, speaking full sentences ABD/GI: Normal bowel sounds; non-distended; soft, non-tender, no rebound, no  guarding, no peritoneal signs, no hepatosplenomegaly BACK:  The back appears normal and is non-tender to palpation, there is no CVA tenderness EXT: Normal ROM in all joints; non-tender to palpation; no edema; normal capillary refill; no cyanosis, no calf tenderness or swelling    SKIN: Normal color for age and race; warm; no rash NEURO: Moves all extremities equally PSYCH: The patient's mood and manner are appropriate. Grooming and personal hygiene are appropriate.  MEDICAL DECISION MAKING: Patient here with atypical chest pain.  Symptoms constant since 1 AM this morning.  States she ate fried seafood for dinner.  Abdominal exam benign.  EKG shows new right bundle branch block.  First troponin negative.  Plan is to treat symptomatically with GI cocktail, Protonix as this seems to be more GI than cardiac in nature.  Doubt PE or dissection.  Will repeat troponin at 8 AM.  ED PROGRESS: Patient's cardiac labs are unremarkable.  Clear chest x-ray.  Patient pain-free after GI cocktail and Protonix.  Plan is to discharge home with Protonix if second troponin is negative.  Patient and daughter are comfortable with this plan.  She has a PCP for follow-up.  I reviewed all nursing notes, vitals, pertinent previous records, EKGs, lab and urine results, imaging (as available).      EKG Interpretation  Date/Time:  Thursday November 14 2017 04:07:10 EDT Ventricular Rate:  95 PR Interval:  166 QRS Duration: 122 QT Interval:  392 QTC Calculation: 492 R Axis:   72 Text Interpretation:  Normal sinus rhythm Right bundle branch block that is new compared to previous Abnormal ECG Confirmed by Pryor Curia [phone]) on 11/14/2017 4:36:00 AM         Sarah Miller, Delice Bison, DO 11/14/17 1031

## 2017-11-14 NOTE — ED Triage Notes (Signed)
Pt states that for the past week she has been having CP, R sided, on and off, with some nausea, pt states her PCP told her it was indigestion. Unrelieved by OTC medication

## 2017-11-14 NOTE — ED Provider Notes (Signed)
Troponin negative x2.  No chest pain at discharge.  Patient is hemodynamically stable.   Nat Christen, MD 11/14/17 [phone]

## 2017-11-18 DIAGNOSIS — R21 Rash and other nonspecific skin eruption: Secondary | ICD-10-CM | POA: Diagnosis not present

## 2017-11-18 DIAGNOSIS — J449 Chronic obstructive pulmonary disease, unspecified: Secondary | ICD-10-CM | POA: Diagnosis not present

## 2017-11-18 DIAGNOSIS — R1011 Right upper quadrant pain: Secondary | ICD-10-CM | POA: Diagnosis not present

## 2017-11-20 DIAGNOSIS — Z23 Encounter for immunization: Secondary | ICD-10-CM | POA: Diagnosis not present

## 2017-11-21 ENCOUNTER — Other Ambulatory Visit: Payer: Self-pay | Admitting: Family Medicine

## 2017-11-21 DIAGNOSIS — R1011 Right upper quadrant pain: Secondary | ICD-10-CM

## 2017-12-04 ENCOUNTER — Other Ambulatory Visit: Payer: Medicare Other

## 2017-12-13 ENCOUNTER — Ambulatory Visit
Admission: RE | Admit: 2017-12-13 | Discharge: 2017-12-13 | Disposition: A | Discharge status: Home / Self Care | Payer: Medicare Other | Source: Ambulatory Visit | Attending: Family Medicine | Admitting: Family Medicine

## 2017-12-13 DIAGNOSIS — R1011 Right upper quadrant pain: Secondary | ICD-10-CM

## 2017-12-13 DIAGNOSIS — K760 Fatty (change of) liver, not elsewhere classified: Secondary | ICD-10-CM | POA: Diagnosis not present

## 2018-01-03 DIAGNOSIS — H1045 Other chronic allergic conjunctivitis: Secondary | ICD-10-CM | POA: Diagnosis not present

## 2018-01-03 DIAGNOSIS — Z961 Presence of intraocular lens: Secondary | ICD-10-CM | POA: Diagnosis not present

## 2018-01-03 DIAGNOSIS — E119 Type 2 diabetes mellitus without complications: Secondary | ICD-10-CM | POA: Diagnosis not present

## 2018-01-03 DIAGNOSIS — H04123 Dry eye syndrome of bilateral lacrimal glands: Secondary | ICD-10-CM | POA: Diagnosis not present

## 2018-01-03 DIAGNOSIS — E113293 Type 2 diabetes mellitus with mild nonproliferative diabetic retinopathy without macular edema, bilateral: Secondary | ICD-10-CM | POA: Diagnosis not present

## 2018-01-07 DIAGNOSIS — J441 Chronic obstructive pulmonary disease with (acute) exacerbation: Secondary | ICD-10-CM | POA: Diagnosis not present

## 2018-02-14 DIAGNOSIS — J209 Acute bronchitis, unspecified: Secondary | ICD-10-CM | POA: Diagnosis not present

## 2018-02-24 DIAGNOSIS — R634 Abnormal weight loss: Secondary | ICD-10-CM | POA: Diagnosis not present

## 2018-02-24 DIAGNOSIS — R5383 Other fatigue: Secondary | ICD-10-CM | POA: Diagnosis not present

## 2018-02-24 DIAGNOSIS — Z7984 Long term (current) use of oral hypoglycemic drugs: Secondary | ICD-10-CM | POA: Diagnosis not present

## 2018-02-24 DIAGNOSIS — E1169 Type 2 diabetes mellitus with other specified complication: Secondary | ICD-10-CM | POA: Diagnosis not present

## 2018-02-24 DIAGNOSIS — R05 Cough: Secondary | ICD-10-CM | POA: Diagnosis not present

## 2018-02-28 ENCOUNTER — Ambulatory Visit (INDEPENDENT_AMBULATORY_CARE_PROVIDER_SITE_OTHER): Payer: Medicare Other | Admitting: Pulmonary Disease

## 2018-02-28 ENCOUNTER — Encounter: Payer: Self-pay | Admitting: Pulmonary Disease

## 2018-02-28 VITALS — BP 124/84 | HR 95 | Ht 62.0 in | Wt 211.6 lb

## 2018-02-28 DIAGNOSIS — R9389 Abnormal findings on diagnostic imaging of other specified body structures: Secondary | ICD-10-CM

## 2018-02-28 DIAGNOSIS — R05 Cough: Secondary | ICD-10-CM | POA: Diagnosis not present

## 2018-02-28 DIAGNOSIS — R059 Cough, unspecified: Secondary | ICD-10-CM

## 2018-02-28 MED ORDER — FLUTICASONE FUROATE-VILANTEROL 200-25 MCG/INH IN AEPB: 1.0000 | INHALATION_SPRAY | Freq: Every day | RESPIRATORY_TRACT | 2 refills | Status: AC

## 2018-02-28 MED ORDER — FLUTICASONE FUROATE-VILANTEROL 100-25 MCG/INH IN AEPB: 1.0000 | INHALATION_SPRAY | Freq: Every day | RESPIRATORY_TRACT | 0 refills | Status: DC

## 2018-02-28 MED ORDER — ALBUTEROL SULFATE HFA 108 (90 BASE) MCG/ACT IN AERS: 2.0000 | INHALATION_SPRAY | Freq: Four times a day (QID) | RESPIRATORY_TRACT | 6 refills | Status: DC | PRN

## 2018-02-28 NOTE — Progress Notes (Signed)
Subjective:    Patient ID: Sarah Miller, female    DOB: 04-May-1945, 73 y.o.   MRN: [id number]  CC: Persistent shortness of breath, cough, non-resolving symptoms since September  HPI: Patient with persistent shortness of breath, dry cough, nasal stuffiness and congestion Previous abnormal CT scan showing lung nodules which has been stable With persistence of symptoms she was recently treated with a course of azithromycin which he tolerated well  She denies any chest pains or chest discomfort No hemoptysis Does have nasal stuffiness and congestion No prior history of allergies or respiratory infections  Quit smoking in 2017  She will have wheezes, shortness of breath, she has no fevers or chills No hemoptysis  Does have a history of GERD diabetes  CT scan from 2019 April did reveal lung nodules, some scarring  She stated she is lost about 22 pounds since September since been ill    Past Medical History:  Diagnosis Date  . Allergic rhinitis   . Cancer [facility])    h/o melanoma '78 and 79  . Cervical arthritis    Dr Delman Kitten, S/P injection with good results.   . Depression    and anxiety  . Diabetes mellitus without complication ([place])   . Early menopause   . Frozen shoulder    H/O frozen shoulder and rotator cuff tendonititis, L s/p injection  . Sleep apnea   . Vitamin D deficiency    Social History   Socioeconomic History  . Marital status: Divorced    Spouse name: Not on file  . Number of children: Not on file  . Years of education: Not on file  . Highest education level: Not on file  Occupational History  . Not on file  Social Needs  . Financial resource strain: Not on file  . Food insecurity:    Worry: Not on file    Inability: Not on file  . Transportation needs:    Medical: Not on file    Non-medical: Not on file  Tobacco Use  . Smoking status: Former Smoker    Packs/day: 0.75    Years: 53.00    Pack years: 39.75    Types: Cigarettes   Last attempt to quit: 02/27/2016    Years since quitting: 2.0  . Smokeless tobacco: Never Used  Substance and Sexual Activity  . Alcohol use: Yes    Comment: rare  . Drug use: No  . Sexual activity: Not on file  Lifestyle  . Physical activity:    Days per week: Not on file    Minutes per session: Not on file  . Stress: Not on file  Relationships  . Social connections:    Talks on phone: Not on file    Gets together: Not on file    Attends religious service: Not on file    Active member of club or organization: Not on file    Attends meetings of clubs or organizations: Not on file    Relationship status: Not on file  . Intimate partner violence:    Fear of current or ex partner: Not on file    Emotionally abused: Not on file    Physically abused: Not on file    Forced sexual activity: Not on file  Other Topics Concern  . Not on file  Social History Narrative  . Not on file   Family History  Problem Relation Age of Onset  . Cancer Mother        ovarian cancer  .  Hypertension Father   . Diabetes Father   . Heart attack Father   . Cancer Father        testicular  . CAD Father   . Heart disease Father   . Hyperlipidemia Father   . Heart attack Brother 30  . CAD Brother   . Heart disease Brother   . CAD Paternal Grandfather   . Hypertension Sister   . Cancer Other   . Hyperlipidemia Other   . Heart disease Other    Review of Systems  Constitutional: Positive for unexpected weight change. Negative for fever.  HENT: Positive for congestion. Negative for dental problem, ear pain, nosebleeds, postnasal drip, rhinorrhea, sinus pressure, sneezing, sore throat and trouble swallowing.   Eyes: Negative for redness and itching.  Respiratory: Positive for cough and shortness of breath. Negative for chest tightness and wheezing.   Cardiovascular: Negative for palpitations and leg swelling.  Gastrointestinal: Negative for nausea and vomiting.  Genitourinary: Negative for  dysuria.  Musculoskeletal: Negative for joint swelling.  Skin: Negative for rash.  Allergic/Immunologic: Negative.  Negative for environmental allergies, food allergies and immunocompromised state.  Neurological: Negative for headaches.  Hematological: Does not bruise/bleed easily.  Psychiatric/Behavioral: Positive for dysphoric mood. The patient is not nervous/anxious.    Vitals:   02/28/18 1028  BP: 124/84  Pulse: 95  SpO2: 95%      Objective:   Physical Exam Constitutional:      General: She is not in acute distress.    Appearance: Normal appearance. She is obese. She is not ill-appearing.  HENT:     Head: Normocephalic and atraumatic.     Nose: Nose normal. No congestion or rhinorrhea.     Mouth/Throat:     Mouth: Mucous membranes are moist.  Eyes:     General:        Right eye: No discharge.        Left eye: No discharge.     Extraocular Movements: Extraocular movements intact.     Pupils: Pupils are equal, round, and reactive to light.  Neck:     Musculoskeletal: Normal range of motion and neck supple. No neck rigidity or muscular tenderness.  Cardiovascular:     Rate and Rhythm: Normal rate and regular rhythm.     Pulses: Normal pulses.     Heart sounds: Normal heart sounds. No murmur. No friction rub.  Pulmonary:     Effort: Pulmonary effort is normal. No respiratory distress.     Breath sounds: Normal breath sounds. No wheezing or rales.  Abdominal:     General: There is no distension.     Palpations: Abdomen is soft.     Tenderness: There is no abdominal tenderness.  Musculoskeletal: Normal range of motion.        General: No swelling or tenderness.  Skin:    General: Skin is warm and dry.     Coloration: Skin is not jaundiced.  Neurological:     General: No focal deficit present.     Mental Status: She is alert.     Cranial Nerves: No cranial nerve deficit.  Psychiatric:        Mood and Affect: Mood normal.        Thought Content: Thought content  normal.    CT-scan of the chest Lung nodules which have been stable, some emphysema, some scarring    Assessment & Plan:  .  Chronic obstructive pulmonary disease -Evidence of emphysema on recent CT -Shortness of breath, chronic  cough   .  Chronic obstructive pulmonary disease with exacerbation -Has recently completed course of antibiotics  .  Abnormal CT scan of the chest showing emphysema, lung nodules -Last CT was in April 2019 showing lung nodules and emphysema -Over 20 pound weight loss is concerning  .  Weight loss -This occurred following recent illness -However because of previously abnormal CT scan, it is prudent to repeat a CT scan to ascertain that there is no progression of findings  .  Allergies -She has a lot of symptoms of nasal stuffiness and congestion and possible postnasal drip  .  History of obstructive sleep apnea .  History of depression/anxiety  Plan: We will repeat a CT scan of the chest as the weight loss is quite concerning Clinical follow-up of symptoms-recently completed course of antibiotics Will order allergy panel to further evaluate what she may be sensitive to causing her recurrent upper respiratory symptoms Continue bronchodilator Will refill Breo Albuterol to be used as needed Initiation of over-the-counter allergy medicines may also be helpful  I will see her back in the office in about 4 weeks

## 2018-02-28 NOTE — Patient Instructions (Addendum)
Recurrent respiratory symptoms since September Recently completed course of azithromycin  Previous CT showing emphysema Breo seems to be helping  We will give you a refill of Breo Albuterol MDI to be used as needed  Over-the-counter allergy medications example Allegra, Claritin,zyrtec may help symptoms  We will order CT scan of the chest-concerned with your recent weight loss We will order an allergy panel We will obtain a breathing study whenever you are feeling better-hopefully by next visit  Will update you with findings on CT as soon as they become available  I will see you back in the office in 4 weeks

## 2018-03-03 LAB — RESPIRATORY ALLERGY PROFILE REGION II ~~LOC~~
Allergen, A. alternata, m6: <0.1 kU/L
Allergen, Cedar tree, t12: <0.1 kU/L
Allergen, Comm Silver Birch, t9: <0.1 kU/L
Allergen, Cottonwood, t14: <0.1 kU/L
Allergen, D pternoyssinus,d7: <0.1 kU/L
Allergen, Mouse Urine Protein, e78: <0.1 kU/L
Allergen, Mulberry, t76: <0.1 kU/L
Allergen, Oak,t7: <0.1 kU/L
Allergen, P. notatum, m1: <0.1 kU/L
Aspergillus fumigatus, m3: <0.1 kU/L
Bermuda Grass: <0.1 kU/L
Box Elder IgE: <0.1 kU/L
CLADOSPORIUM HERBARUM (M2) IGE: <0.1 kU/L
COMMON RAGWEED (SHORT) (W1) IGE: <0.1 kU/L
Cat Dander: <0.1 kU/L
Class: 0
Class: 0
Class: 0
Class: 0
Class: 0
Class: 0
Class: 0
Class: 0
Class: 0
Class: 0
Class: 0
Class: 0
Class: 0
Class: 0
Class: 0
Class: 0
Class: 0
Class: 0
Class: 0
Class: 0
Class: 0
Class: 0
Class: 0
Class: 0
Cockroach: <0.1 kU/L
D. farinae: <0.1 kU/L
Dog Dander: <0.1 kU/L
Elm IgE: <0.1 kU/L
IgE (Immunoglobulin E), Serum: 312 kU/L — ABNORMAL HIGH (ref <=114)
Johnson Grass: <0.1 kU/L
Pecan/Hickory Tree IgE: <0.1 kU/L
Rough Pigweed  IgE: <0.1 kU/L
Sheep Sorrel IgE: <0.1 kU/L
Timothy Grass: <0.1 kU/L

## 2018-03-03 LAB — INTERPRETATION:

## 2018-03-04 ENCOUNTER — Ambulatory Visit (INDEPENDENT_AMBULATORY_CARE_PROVIDER_SITE_OTHER)
Admission: RE | Admit: 2018-03-04 | Discharge: 2018-03-04 | Disposition: A | Discharge status: Home / Self Care | Payer: Medicare Other | Source: Ambulatory Visit | Attending: Pulmonary Disease | Admitting: Pulmonary Disease

## 2018-03-04 DIAGNOSIS — R9389 Abnormal findings on diagnostic imaging of other specified body structures: Secondary | ICD-10-CM

## 2018-03-04 DIAGNOSIS — J90 Pleural effusion, not elsewhere classified: Secondary | ICD-10-CM | POA: Diagnosis not present

## 2018-03-07 ENCOUNTER — Telehealth: Payer: Self-pay | Admitting: Pulmonary Disease

## 2018-03-07 NOTE — Telephone Encounter (Signed)
Called and spoke with patient, she is requesting results from CT scan. Advised patient that I would send message over to AO to result images. AO please advise, thank you.

## 2018-03-10 NOTE — Telephone Encounter (Signed)
Patient returned call, CB is [phone].

## 2018-03-10 NOTE — Telephone Encounter (Signed)
Pt is aware of results and voiced her understanding. CT low dose has been ordered and is expected around 05/2018. Nothing further is needed.

## 2018-03-10 NOTE — Telephone Encounter (Signed)
LMTCB

## 2018-03-10 NOTE — Telephone Encounter (Signed)
CT chest significant for mild emphysema, mild scarring -No suggestion of mass or any other abnormality.  To follow-up with low-dose screening done yearly  Allergy panel testing is negative  Follow-up as scheduled

## 2018-03-10 NOTE — Telephone Encounter (Signed)
Called and spoke to pt. Pt requesting is CT results from 03/05/18.  Dr. Ander Slade please advise. Thanks

## 2018-03-20 DIAGNOSIS — E1169 Type 2 diabetes mellitus with other specified complication: Secondary | ICD-10-CM | POA: Diagnosis not present

## 2018-03-20 DIAGNOSIS — J439 Emphysema, unspecified: Secondary | ICD-10-CM | POA: Diagnosis not present

## 2018-03-20 DIAGNOSIS — N3090 Cystitis, unspecified without hematuria: Secondary | ICD-10-CM | POA: Diagnosis not present

## 2018-03-20 DIAGNOSIS — B373 Candidiasis of vulva and vagina: Secondary | ICD-10-CM | POA: Diagnosis not present

## 2018-03-20 DIAGNOSIS — R350 Frequency of micturition: Secondary | ICD-10-CM | POA: Diagnosis not present

## 2018-03-20 DIAGNOSIS — R739 Hyperglycemia, unspecified: Secondary | ICD-10-CM | POA: Diagnosis not present

## 2018-03-20 DIAGNOSIS — R634 Abnormal weight loss: Secondary | ICD-10-CM | POA: Diagnosis not present

## 2018-03-26 ENCOUNTER — Ambulatory Visit: Payer: Medicare Other | Admitting: Pulmonary Disease

## 2018-03-31 ENCOUNTER — Encounter: Payer: Self-pay | Admitting: Pulmonary Disease

## 2018-03-31 ENCOUNTER — Ambulatory Visit (INDEPENDENT_AMBULATORY_CARE_PROVIDER_SITE_OTHER): Payer: Medicare Other | Admitting: Pulmonary Disease

## 2018-03-31 VITALS — BP 106/68 | HR 82 | Ht 62.0 in | Wt 209.0 lb

## 2018-03-31 DIAGNOSIS — R0602 Shortness of breath: Secondary | ICD-10-CM | POA: Diagnosis not present

## 2018-03-31 DIAGNOSIS — R05 Cough: Secondary | ICD-10-CM | POA: Diagnosis not present

## 2018-03-31 DIAGNOSIS — R634 Abnormal weight loss: Secondary | ICD-10-CM | POA: Diagnosis not present

## 2018-03-31 DIAGNOSIS — E1169 Type 2 diabetes mellitus with other specified complication: Secondary | ICD-10-CM | POA: Diagnosis not present

## 2018-03-31 MED ORDER — FLUTICASONE PROPIONATE 50 MCG/ACT NA SUSP: 2.0000 | Freq: Every day | NASAL | 2 refills | Status: DC

## 2018-03-31 MED ORDER — MONTELUKAST SODIUM 10 MG PO TABS: 10.0000 mg | ORAL_TABLET | Freq: Every day | ORAL | 11 refills | Status: DC

## 2018-03-31 NOTE — Patient Instructions (Signed)
I will see her back in the office in about 4 weeks  Continue using Breo  Trial with Singulair and Flonase nasal spray should help nasal congestion and possibly reduce postnasal drip which may be contributing to ongoing symptoms

## 2018-03-31 NOTE — Progress Notes (Signed)
Subjective:    Patient ID: Sarah Miller, female    DOB: 1946-01-09, 73 y.o.   MRN: [id number]  CC: Persistent shortness of breath, cough, non-resolving symptoms since September  HPI: Since her last visit here, has not noticed any significant improvement in symptoms She continues to use Breo 200-she notices it helps some in the morning but the effect wears off Continues to have nasal stuffiness and congestion   symptoms are not significantly better according to patient Chest stuffiness  Patient with persistent shortness of breath, dry cough, nasal stuffiness and congestion Previous abnormal CT scan showing lung nodules which has been stable-repeat CT is stable with emphysema and stable nodules With persistence of symptoms she was recently treated with a course of azithromycin which she tolerated well  She denies any chest pains or chest discomfort No hemoptysis No prior history of allergies or respiratory infections  Quit smoking in 2017  She will have wheezes, shortness of breath, she has no fevers or chills No hemoptysis  Does have a history of GERD diabetes  Weight has stabilized    Past Medical History:  Diagnosis Date  . Allergic rhinitis   . Cancer [facility])    h/o melanoma '78 and 79  . Cervical arthritis    Dr Delman Kitten, S/P injection with good results.   . Depression    and anxiety  . Diabetes mellitus without complication ([place])   . Early menopause   . Frozen shoulder    H/O frozen shoulder and rotator cuff tendonititis, L s/p injection  . Sleep apnea   . Vitamin D deficiency    Social History   Socioeconomic History  . Marital status: Divorced    Spouse name: Not on file  . Number of children: Not on file  . Years of education: Not on file  . Highest education level: Not on file  Occupational History  . Not on file  Social Needs  . Financial resource strain: Not on file  . Food insecurity:    Worry: Not on file    Inability: Not on file    . Transportation needs:    Medical: Not on file    Non-medical: Not on file  Tobacco Use  . Smoking status: Former Smoker    Packs/day: 0.75    Years: 53.00    Pack years: 39.75    Types: Cigarettes    Last attempt to quit: 02/27/2016    Years since quitting: 2.0  . Smokeless tobacco: Never Used  Substance and Sexual Activity  . Alcohol use: Yes    Comment: rare  . Drug use: No  . Sexual activity: Not on file  Lifestyle  . Physical activity:    Days per week: Not on file    Minutes per session: Not on file  . Stress: Not on file  Relationships  . Social connections:    Talks on phone: Not on file    Gets together: Not on file    Attends religious service: Not on file    Active member of club or organization: Not on file    Attends meetings of clubs or organizations: Not on file    Relationship status: Not on file  . Intimate partner violence:    Fear of current or ex partner: Not on file    Emotionally abused: Not on file    Physically abused: Not on file    Forced sexual activity: Not on file  Other Topics Concern  . Not  on file  Social History Narrative  . Not on file   Family History  Problem Relation Age of Onset  . Cancer Mother        ovarian cancer  . Hypertension Father   . Diabetes Father   . Heart attack Father   . Cancer Father        testicular  . CAD Father   . Heart disease Father   . Hyperlipidemia Father   . Heart attack Brother 7  . CAD Brother   . Heart disease Brother   . CAD Paternal Grandfather   . Hypertension Sister   . Cancer Other   . Hyperlipidemia Other   . Heart disease Other    Review of Systems  Constitutional: Positive for unexpected weight change. Negative for fever.  HENT: Positive for congestion. Negative for dental problem, ear pain, nosebleeds, postnasal drip and rhinorrhea.   Eyes: Negative for redness and itching.  Respiratory: Positive for cough and shortness of breath. Negative for chest tightness and wheezing.    Cardiovascular: Negative for palpitations and leg swelling.  Gastrointestinal: Negative for nausea and vomiting.  Genitourinary: Negative for dysuria.  Musculoskeletal: Negative for joint swelling.  Skin: Negative for rash.  Allergic/Immunologic: Negative.  Negative for environmental allergies, food allergies and immunocompromised state.  Neurological: Negative for headaches.  Hematological: Does not bruise/bleed easily.  Psychiatric/Behavioral: Positive for dysphoric mood. The patient is not nervous/anxious.    Vitals:   03/31/18 1521  BP: 106/68  Pulse: 82  SpO2: 95%      Objective:   Physical Exam Constitutional:      General: She is not in acute distress.    Appearance: Normal appearance. She is obese. She is not ill-appearing.  HENT:     Head: Normocephalic and atraumatic.     Nose: Nose normal. No congestion or rhinorrhea.     Mouth/Throat:     Mouth: Mucous membranes are moist.     Pharynx: No oropharyngeal exudate or posterior oropharyngeal erythema.  Eyes:     General:        Right eye: No discharge.        Left eye: No discharge.     Extraocular Movements: Extraocular movements intact.     Pupils: Pupils are equal, round, and reactive to light.  Neck:     Musculoskeletal: Normal range of motion and neck supple. No neck rigidity or muscular tenderness.  Cardiovascular:     Rate and Rhythm: Normal rate and regular rhythm.     Pulses: Normal pulses.     Heart sounds: Normal heart sounds. No murmur. No friction rub.  Pulmonary:     Effort: Pulmonary effort is normal. No respiratory distress.     Breath sounds: Normal breath sounds. No wheezing or rales.  Abdominal:     General: There is no distension.     Palpations: Abdomen is soft.     Tenderness: There is no abdominal tenderness.  Neurological:     Mental Status: She is alert.    CT-scan of the chest Lung nodules which have been stable, some emphysema, some scarring    Assessment & Plan:  .  Chronic  obstructive pulmonary disease -Evidence of emphysema on recent CT -Shortness of breath, chronic cough   .  Chronic obstructive pulmonary disease with exacerbation -Has recently completed course of antibiotics  .  Abnormal CT scan of the chest showing emphysema, lung nodules -Repeat CT is stable  .  Weight loss -This occurred following  recent illness -This is stabilized  .  Allergies -She has a lot of symptoms of nasal stuffiness and congestion and possible postnasal drip  .  History of obstructive sleep apnea .  History of depression/anxiety  Plan: We will prescribe Singulair and Flonase nasal spray  She will continue use of Breo  Continue bronchodilator  Albuterol to be used as needed  I will see her back in the office in about 4 weeks

## 2018-04-02 ENCOUNTER — Other Ambulatory Visit: Payer: Self-pay | Admitting: Family Medicine

## 2018-04-02 ENCOUNTER — Telehealth: Payer: Self-pay | Admitting: Pulmonary Disease

## 2018-04-02 ENCOUNTER — Other Ambulatory Visit: Payer: Self-pay | Admitting: Pulmonary Disease

## 2018-04-02 DIAGNOSIS — R05 Cough: Secondary | ICD-10-CM

## 2018-04-02 DIAGNOSIS — R059 Cough, unspecified: Secondary | ICD-10-CM

## 2018-04-02 DIAGNOSIS — R634 Abnormal weight loss: Secondary | ICD-10-CM

## 2018-04-02 MED ORDER — HYDROCODONE-HOMATROPINE 5-1.5 MG/5ML PO SYRP: 5.0000 mL | ORAL_SOLUTION | Freq: Four times a day (QID) | ORAL | 0 refills | Status: DC | PRN

## 2018-04-02 MED ORDER — HYDROCODONE-HOMATROPINE 5-1.5 MG/5ML PO SYRP: 5.0000 mL | ORAL_SOLUTION | ORAL | Status: DC | PRN

## 2018-04-02 NOTE — Telephone Encounter (Signed)
Dr. Ander Slade, you ordered the cough medicine under clinic administered, could you please order under the house? Pt is aware to prescription sent to pharmacy and voiced understanding.  Nothing further is needed.

## 2018-04-02 NOTE — Telephone Encounter (Signed)
Sent Dr Ander Slade a message this morning about changing the script to the house instead of clinic.  Pt is calling back stating pharmacy has not received the script.  Message sent to [facility].  Spoke to pt and will call her back.

## 2018-04-02 NOTE — Telephone Encounter (Signed)
Please see phone note from 04/02/18

## 2018-04-02 NOTE — Telephone Encounter (Signed)
lmtcb for [employer]

## 2018-04-02 NOTE — Telephone Encounter (Signed)
Primary Pulmonologist: Dr Ander Slade Last office visit and with whom: 03/31/18-Dr Ander Slade What do we see them for (pulmonary problems): SHOB Last OV assessment/plan: 03/31/18-SHOB  Instructions   I will see her back in the office in about 4 weeks  Continue using Breo  Trial with Singulair and Flonase nasal spray should help nasal congestion and possibly reduce postnasal drip which may be contributing to ongoing symptoms       Was appointment offered to patient (explain)?  Yes, Patient declined.  She was just seen 03/31/18, and was told to call if she felt worse.   Reason for call: Patient stated that she has felt worse since 03/31/18 OV.  She is having increase cough, with occasional clear mucus/phlegm.  She stated that she coughs so hard, she gags.  She is feeling more SHOB then usual. She has been taking her Breo daily, and Singulair. She is requesting any prescriptions to be sent to [place].  Will route to Dr Ander Slade

## 2018-04-02 NOTE — Telephone Encounter (Signed)
Called and spoke with Patient. Informed her that Dr Ander Slade sent a Hycodan prescription to pharmacy.  Understanding stated.  Nothing further at this time.

## 2018-04-02 NOTE — Telephone Encounter (Signed)
Prescription for Hycodan sent

## 2018-04-02 NOTE — Telephone Encounter (Signed)
Sent in a prescription for HyCODAN- 39ml Q4 prn for cough  singulair and flonase that we discussed during the visit will take at least a couple of weeks to take optimal effect if she is using them as directed

## 2018-04-03 NOTE — Telephone Encounter (Signed)
There is no medication that I know of that will stop the cough completely and using the medication for a day or just a few days will definitely not stop the cough.  My position is to continue along the lines of what we have so far  We can offer the patient an appointment to come in and see Dr. Melvyn Novas who is our expert on cough and related symptoms to see if he may be able to help her further.  A different set of eyes may be able to help.  -Update me on our course

## 2018-04-03 NOTE — Telephone Encounter (Signed)
Called and spoke to [facility]) and relayed below message.  Rosendo Gros stated she would discuss with pt and will call us back with an update.

## 2018-04-03 NOTE — Telephone Encounter (Signed)
Called and spoke to pt's daughter, Rosendo Gros ([place]).  Rosendo Gros states that pt was able to pick up Rx for Hycodan. Rosendo Gros states that pt's cough is not improving with Hycodan.  Rosendo Gros is concerned, as pt has upcoming CT on Monday and pt can not be coughing.  Pt is experiencing non prod cough & head/nasal congestion.  Denied fever, chills or sweats.  Pt is continuing to take singulair and Flonase daily.  Rosendo Gros is requesting additional recommendations. appt offered, Rosendo Gros is requesting recommendations prior to OV.  Dr. Ander Slade please advise. Thanks

## 2018-04-07 ENCOUNTER — Ambulatory Visit
Admission: RE | Admit: 2018-04-07 | Discharge: 2018-04-07 | Disposition: A | Discharge status: Home / Self Care | Payer: Medicare Other | Source: Ambulatory Visit | Attending: Family Medicine | Admitting: Family Medicine

## 2018-04-07 DIAGNOSIS — R634 Abnormal weight loss: Secondary | ICD-10-CM

## 2018-04-07 DIAGNOSIS — E279 Disorder of adrenal gland, unspecified: Secondary | ICD-10-CM | POA: Diagnosis not present

## 2018-04-07 DIAGNOSIS — N281 Cyst of kidney, acquired: Secondary | ICD-10-CM | POA: Diagnosis not present

## 2018-04-07 MED ORDER — IOPAMIDOL (ISOVUE-300) INJECTION 61%
100.0000 mL | Freq: Once | INTRAVENOUS | Status: AC | PRN
  Administered 2018-04-07: 100 mL via INTRAVENOUS

## 2018-04-07 NOTE — Telephone Encounter (Signed)
Called and spoke with [place], (DPR). Patient is currently having CT ABD Pelvis.  She said that they are thinking about recommendations and will call back once decision has been made.

## 2018-04-08 ENCOUNTER — Ambulatory Visit (INDEPENDENT_AMBULATORY_CARE_PROVIDER_SITE_OTHER): Payer: Medicare Other | Admitting: Pulmonary Disease

## 2018-04-08 ENCOUNTER — Encounter: Payer: Self-pay | Admitting: Pulmonary Disease

## 2018-04-08 ENCOUNTER — Other Ambulatory Visit: Payer: Self-pay | Admitting: Family Medicine

## 2018-04-08 ENCOUNTER — Telehealth: Payer: Self-pay | Admitting: Pulmonary Disease

## 2018-04-08 VITALS — BP 134/86 | HR 94 | Temp 98.3°F | Ht 62.0 in | Wt 207.8 lb

## 2018-04-08 DIAGNOSIS — E278 Other specified disorders of adrenal gland: Secondary | ICD-10-CM

## 2018-04-08 DIAGNOSIS — R059 Cough, unspecified: Secondary | ICD-10-CM

## 2018-04-08 DIAGNOSIS — R05 Cough: Secondary | ICD-10-CM | POA: Diagnosis not present

## 2018-04-08 MED ORDER — LEVOFLOXACIN 500 MG PO TABS: 500.0000 mg | ORAL_TABLET | Freq: Every day | ORAL | 0 refills | Status: DC

## 2018-04-08 MED ORDER — BENZONATATE 100 MG PO CAPS: 200.0000 mg | ORAL_CAPSULE | Freq: Three times a day (TID) | ORAL | 0 refills | Status: DC | PRN

## 2018-04-08 MED ORDER — PREDNISONE 10 MG PO TABS: 20.0000 mg | ORAL_TABLET | Freq: Every day | ORAL | 0 refills | Status: DC

## 2018-04-08 NOTE — Telephone Encounter (Signed)
Called and spoke with Patients Daughter, Sarah Miller.  She stated that they were interested in seeing Dr Melvyn Novas, but Sarah Miller wants her Mother seen today, by someone.  She stated that she is coughing uncontrollably.  At times she coughs so hard she gags and gets lightheaded.  Dr Ander Slade has opening today.  She scheduled OV with Dr Ander Slade 04/08/18, 1145.  Nothing further at this time.

## 2018-04-08 NOTE — Progress Notes (Signed)
Subjective:    Patient ID: Sarah Miller, female    DOB: 04-28-1945, 73 y.o.   MRN: [id number]  CC: Persistent shortness of breath, cough, non-resolving symptoms since September In for an acute visit today-non-resolving symptoms  HPI: Since her last visit here, has not noticed any significant improvement in symptoms She actually feels worse despite use of Hycodan-did not tolerate this-made her sick in her belly  She continues to use Breo 200-she notices it helps some in the morning but the effect wears off Continues to have nasal stuffiness and congestion   Patient with persistent shortness of breath, dry cough-more productive recently, nasal stuffiness and congestion Previous abnormal CT scan showing lung nodules which has been stable-repeat CT is stable with emphysema and stable nodules With persistence of symptoms she was recently treated with a course of azithromycin which she tolerated well  She denies any chest pains or chest discomfort No hemoptysis No prior history of allergies or respiratory infections  Quit smoking in 2017  She will have wheezes, shortness of breath, she has no fevers or chills No hemoptysis  Does have a history of GERD diabetes   Shortness of Breath  Pertinent negatives include no ear pain, fever, headaches, leg swelling, rash, rhinorrhea, vomiting or wheezing.   Past Medical History:  Diagnosis Date  . Allergic rhinitis   . Cancer [facility])    h/o melanoma '78 and 79  . Cervical arthritis    Dr Delman Kitten, S/P injection with good results.   . Depression    and anxiety  . Diabetes mellitus without complication ([place])   . Early menopause   . Frozen shoulder    H/O frozen shoulder and rotator cuff tendonititis, L s/p injection  . Sleep apnea   . Vitamin D deficiency    Social History   Socioeconomic History  . Marital status: Divorced    Spouse name: Not on file  . Number of children: Not on file  . Years of education: Not on file    . Highest education level: Not on file  Occupational History  . Not on file  Social Needs  . Financial resource strain: Not on file  . Food insecurity:    Worry: Not on file    Inability: Not on file  . Transportation needs:    Medical: Not on file    Non-medical: Not on file  Tobacco Use  . Smoking status: Former Smoker    Packs/day: 0.75    Years: 53.00    Pack years: 39.75    Types: Cigarettes    Last attempt to quit: 02/27/2016    Years since quitting: 2.1  . Smokeless tobacco: Never Used  Substance and Sexual Activity  . Alcohol use: Yes    Comment: rare  . Drug use: No  . Sexual activity: Not on file  Lifestyle  . Physical activity:    Days per week: Not on file    Minutes per session: Not on file  . Stress: Not on file  Relationships  . Social connections:    Talks on phone: Not on file    Gets together: Not on file    Attends religious service: Not on file    Active member of club or organization: Not on file    Attends meetings of clubs or organizations: Not on file    Relationship status: Not on file  . Intimate partner violence:    Fear of current or ex partner: Not on file  Emotionally abused: Not on file    Physically abused: Not on file    Forced sexual activity: Not on file  Other Topics Concern  . Not on file  Social History Narrative  . Not on file   Family History  Problem Relation Age of Onset  . Cancer Mother        ovarian cancer  . Hypertension Father   . Diabetes Father   . Heart attack Father   . Cancer Father        testicular  . CAD Father   . Heart disease Father   . Hyperlipidemia Father   . Heart attack Brother 54  . CAD Brother   . Heart disease Brother   . CAD Paternal Grandfather   . Hypertension Sister   . Cancer Other   . Hyperlipidemia Other   . Heart disease Other    Review of Systems  Constitutional: Negative for fever and unexpected weight change.  HENT: Positive for congestion. Negative for dental problem,  ear pain, nosebleeds, postnasal drip and rhinorrhea.   Eyes: Negative for redness and itching.  Respiratory: Positive for cough and shortness of breath. Negative for choking, chest tightness and wheezing.   Cardiovascular: Negative for palpitations and leg swelling.  Gastrointestinal: Negative for nausea and vomiting.  Skin: Negative for rash.  Allergic/Immunologic: Negative.  Negative for environmental allergies, food allergies and immunocompromised state.  Neurological: Negative for headaches.  Hematological: Does not bruise/bleed easily.  Psychiatric/Behavioral: Positive for dysphoric mood. The patient is not nervous/anxious.    Vitals:   04/08/18 1156  BP: 134/86  Pulse: 94  Temp: 98.3 F (36.8 C)  SpO2: 90%      Objective:   Physical Exam Constitutional:      General: She is not in acute distress.    Appearance: Normal appearance. She is obese. She is not ill-appearing.  HENT:     Head: Normocephalic and atraumatic.     Nose: Nose normal. No congestion or rhinorrhea.     Mouth/Throat:     Mouth: Mucous membranes are moist.     Pharynx: No oropharyngeal exudate or posterior oropharyngeal erythema.  Eyes:     General:        Right eye: No discharge.        Left eye: No discharge.     Extraocular Movements: Extraocular movements intact.     Pupils: Pupils are equal, round, and reactive to light.  Neck:     Musculoskeletal: Normal range of motion and neck supple. No neck rigidity or muscular tenderness.  Cardiovascular:     Rate and Rhythm: Normal rate and regular rhythm.     Pulses: Normal pulses.     Heart sounds: Normal heart sounds. No murmur. No friction rub.  Pulmonary:     Effort: Pulmonary effort is normal. No respiratory distress.     Breath sounds: Normal breath sounds. No decreased breath sounds, wheezing, rhonchi or rales.  Abdominal:     General: There is no distension.  Neurological:     Mental Status: She is alert.    CT-scan of the chest Lung  nodules which have been stable, some emphysema, some scarring -Reviewed today 04/08/2018    Assessment & Plan:  .  Chronic obstructive pulmonary disease -Evidence of emphysema on recent CT -Shortness of breath, chronic cough .  Marland Kitchen  Bronchitic exacerbation of COPD -We will give her a course of Levaquin -Course of prednisone -Tessalon Perles  .  Abnormal CT scan of  the chest showing emphysema, lung nodules -Repeat CT is stable  .  Weight loss -This occurred following recent illness -This is stabilized  .  Allergies -She has a lot of symptoms of nasal stuffiness and congestion and possible postnasal drip  .  History of obstructive sleep apnea .  History of depression/anxiety  Plan: We will prescribe Singulair and Flonase nasal spray  Tessalon Perles reordered We will complete course of antibiotics Complete course of steroids  She will continue use of Breo  Continue bronchodilator  Albuterol to be used as needed  With persistence of symptoms I did offer patient an opportunity to see Dr. Melvyn Novas to see if he has a different angle on her cough

## 2018-04-08 NOTE — Patient Instructions (Signed)
Persistent bronchitis Did not tolerate Hycodan  Tessalon Levaquin Prednisone  Follow-up appointment with Dr. Melvyn Novas  Call with significant concerns

## 2018-04-16 ENCOUNTER — Telehealth: Payer: Self-pay | Admitting: Pulmonary Disease

## 2018-04-16 DIAGNOSIS — E1169 Type 2 diabetes mellitus with other specified complication: Secondary | ICD-10-CM | POA: Diagnosis not present

## 2018-04-16 DIAGNOSIS — R634 Abnormal weight loss: Secondary | ICD-10-CM | POA: Diagnosis not present

## 2018-04-16 DIAGNOSIS — R05 Cough: Secondary | ICD-10-CM | POA: Diagnosis not present

## 2018-04-16 DIAGNOSIS — M793 Panniculitis, unspecified: Secondary | ICD-10-CM | POA: Diagnosis not present

## 2018-04-16 NOTE — Telephone Encounter (Signed)
Spoke with pt's daughter  Sarah Miller has already stated that he wanted eval by wert so I have scheduled the appt for tomorrow at 3:30  Pt aware to bring all medications to this visit

## 2018-04-17 ENCOUNTER — Ambulatory Visit (INDEPENDENT_AMBULATORY_CARE_PROVIDER_SITE_OTHER): Payer: Medicare Other | Admitting: Internal Medicine

## 2018-04-17 ENCOUNTER — Encounter: Payer: Self-pay | Admitting: Internal Medicine

## 2018-04-17 DIAGNOSIS — R058 Other specified cough: Secondary | ICD-10-CM

## 2018-04-17 DIAGNOSIS — R05 Cough: Secondary | ICD-10-CM | POA: Diagnosis not present

## 2018-04-17 MED ORDER — TRAMADOL HCL 50 MG PO TABS: 50.0000 mg | ORAL_TABLET | ORAL | 0 refills | Status: DC | PRN

## 2018-04-17 NOTE — Patient Instructions (Addendum)
Ompeprazole 40 mg Take 30- 60 min before your first and last meals of the day   Stop Breo   GERD (REFLUX)  is an extremely common cause of respiratory symptoms just like yours , many times with no obvious heartburn at all.    It can be treated with medication, but also with lifestyle changes including elevation of the head of your bed (ideally with 6 -8inch blocks under the headboard of your bed),  Smoking cessation, avoidance of late meals, excessive alcohol, and avoid fatty foods, chocolate, peppermint, colas, red wine, and acidic juices such as orange juice.  NO MINT OR MENTHOL PRODUCTS SO NO COUGH DROPS  USE SUGARLESS CANDY INSTEAD (Jolley ranchers or Stover's or Life Savers) or even ice chips will also do - the key is to swallow to prevent all throat clearing. NO OIL BASED VITAMINS - use powdered substitutes.  Avoid fish oil when coughing.  Finish your prednisone   Take delsym two tsp every 12 hours and supplement if needed with  tramadol 50 mg up to 1 every 4 hours to suppress the urge to cough. Swallowing water and/or using ice chips/non mint and menthol containing candies (such as lifesavers or sugarless jolly ranchers) are also effective.  You should rest your voice and avoid activities that you know make you cough.  Once you have eliminated the cough for 3 straight days try reducing the tramadol first,  then the delsym as tolerated.  Please schedule a follow up office visit in 2 weeks, sooner if needed  with all medications /inhalers/ solutions in hand so we can verify exactly what you are taking. This includes all medications from all doctors and over the counters

## 2018-04-17 NOTE — Progress Notes (Signed)
Sarah Miller, female    DOB: 1945/10/30,   MRN: [id number]   Brief patient profile:  59 yowf quit smoking 02/2016 with pattern of violent sneezing going back to childhood and never had any lower resp problems but as an adult noted pattern of "bronchitis" ? Better p quit smoking but really sick in Sept 2019 > ER with midline cp better p rx omeprazole which seemed to help the cp but not the cough which persisted daily since = minimally productive more when lie down but present  24/7 and evolves sometimes into severe  choking spells  With w/u and Rx by Dr Ander Slade and sought second opinion so referred to me 04/17/2018.  Prev eval: rx Breo 200 variably reports "better right after take it because it helps me cough up stuff  ? How much?  = just a speck ? Better on prednisone but made sugars go up and losing wt with overt hyperglycemia and marked elevation of hgbA1c singulair addied 04/08/18      History of Present Illness  04/17/2018  Pulmonary/ 1st office eval/Sarah Miller  Chief Complaint  Patient presents with  . Follow-up    Second opinion on cough per Dr Ander Slade. Pt c/o cough since September 2019. Cough is occ prod with clear to yellow sputum.   Dyspnea:  Minimally doe Unless coughing    Cough: minimally productive no worse in am/ cough to point of gag  Sleep: sleeps on side / cough some but no gagging nocturnally  SABA use: no better with saba  Takes ppi with bfast, using mints/ cough drops    No obvious day to day or daytime variability or assoc excess/ purulent sputum or mucus plugs or hemoptysis or cp or chest tightness, subjective wheeze or overt sinus or hb symptoms.    Also denies any obvious fluctuation of symptoms with weather or environmental changes or other aggravating or alleviating factors except as outlined above   No unusual exposure hx or h/o childhood pna/ asthma or knowledge of premature birth.  Current Allergies, Complete Past Medical History, Past Surgical History,  Family History, and Social History were reviewed in [employer] record.  ROS  The following are not active complaints unless bolded Hoarseness, sore throat, dysphagia, dental problems, itching, sneezing,  nasal congestion or discharge of excess mucus or purulent secretions, ear ache,   fever, chills, sweats, unintended wt loss or wt gain, classically pleuritic or exertional cp,  orthopnea pnd or arm/hand swelling  or leg swelling, presyncope, palpitations, abdominal pain, anorexia, nausea, vomiting, diarrhea  or change in bowel habits or change in bladder habits, change in stools or change in urine, dysuria, hematuria,  rash, arthralgias, visual complaints, headache, numbness, weakness or ataxia or problems with walking or coordination,  change in mood= depressed or  memory.            Past Medical History:  Diagnosis Date  . Allergic rhinitis   . Cancer [facility])    h/o melanoma '78 and 79  . Cervical arthritis    Dr Delman Kitten, S/P injection with good results.   . Depression    and anxiety  . Diabetes mellitus without complication ([place])   . Early menopause   . Frozen shoulder    H/O frozen shoulder and rotator cuff tendonititis, L s/p injection  . Sleep apnea   . Vitamin D deficiency     Outpatient Medications Prior to Visit  Medication Sig Dispense Refill  . albuterol (PROVENTIL HFA;VENTOLIN HFA)  108 (90 Base) MCG/ACT inhaler Inhale 2 puffs into the lungs every 6 (six) hours as needed for wheezing or shortness of breath. 1 Inhaler 6  . atorvastatin (LIPITOR) 10 MG tablet Take 10 mg by mouth daily.  3  . buPROPion (WELLBUTRIN XL) 150 MG 24 hr tablet Take 150 mg by mouth daily. Take with 300 mg to equal 450 mg  1  . buPROPion (WELLBUTRIN XL) 300 MG 24 hr tablet Take 300 mg by mouth daily. Take with 150 mg to equal 450 mg    . fluticasone (FLONASE) 50 MCG/ACT nasal spray Place 2 sprays into both nostrils daily. 16 g 2  . fluticasone furoate-vilanterol (BREO  ELLIPTA) 100-25 MCG/INH AEPB Inhale 1 puff into the lungs daily.    Marland Kitchen ibuprofen (ADVIL,MOTRIN) 200 MG tablet Take 200 mg by mouth every 6 (six) hours as needed.    Marland Kitchen levofloxacin (LEVAQUIN) 500 MG tablet Take 1 tablet (500 mg total) by mouth daily. 7 tablet 0  . metFORMIN (GLUCOPHAGE-XR) 500 MG 24 hr tablet Take 2,000 mg by mouth every evening.   1  . montelukast (SINGULAIR) 10 MG tablet Take 1 tablet (10 mg total) by mouth at bedtime. 30 tablet 11  . omeprazole (PRILOSEC) 40 MG capsule Take 40 mg by mouth daily.    . predniSONE (DELTASONE) 10 MG tablet Take 2 tablets (20 mg total) by mouth daily with breakfast. 20 tablet 0  . benzonatate (TESSALON PERLES) 100 MG capsule Take 2 capsules (200 mg total) by mouth 3 (three) times daily as needed for cough. 60 capsule 0  . HYDROcodone-homatropine (HYCODAN) 5-1.5 MG/5ML syrup Take 5 mLs by mouth every 6 (six) hours as needed for up to 15 days for cough. 240 mL 0  . oxybutynin (DITROPAN-XL) 10 MG 24 hr tablet Take 10 mg by mouth daily.  5  . pantoprazole (PROTONIX) 40 MG tablet Take 1 tablet (40 mg total) by mouth daily. 30 tablet 0  . RESTASIS 0.05 % ophthalmic emulsion Place 1 drop into both eyes 2 (two) times daily.  1      Objective:     BP 118/80 (BP Location: Left Arm, Cuff Size: Normal)   Pulse 92   Ht 5' 1.25" (1.556 m)   Wt 203 lb (92.1 kg)   SpO2 92%   BMI 38.04 kg/m    SpO2: 92 %  RA     Hoarse depressed appearing wf, very evasive historian  - hopeless/ helpless affect with occ vigorous throat clearing.  HEENT: nl  Oropharynx s pnds. Nl external ear canals without cough reflex Edentulous/ mod  bilateral non-specific turbinate edema     NECK :  without JVD/Nodes/TM/ nl carotid upstrokes bilaterally   LUNGS: no acc muscle use,  Nl contour chest which is clear to A and P bilaterally without cough on insp or exp maneuvers   CV:  RRR  no s3 or murmur or increase in P2, and  Trace bilateral ankle edema  ABD:  Mod Obese  soft  and nontender with nl inspiratory excursion in the supine position. No bruits or organomegaly appreciated, bowel sounds nl  MS:  Nl gait/ ext warm without deformities, calf tenderness, cyanosis or clubbing No obvious joint restrictions   SKIN: warm and dry without lesions    NEURO:  alert, approp, nl sensorium with  no motor or cerebellar deficits apparent.      I personally reviewed images and agree with radiology impression as follows:   Chest CT  03/04/18  Stable small pericardial effusion.  Stable right lung scarring is noted.  No acute abnormality seen in the chest.  Aortic Atherosclerosis (ICD10-I70.0) and Emphysema      Assessment   Upper airway cough syndrome Onset mid sept 2019 - Allergy profile  02/28/2018    IgE  312  RAST neg    - 04/17/2018 d/c breo/ max rx for gerd/ cyclical cough   The hoarseness and cough to point of gagging are typical of  Upper airway cough syndrome (previously labeled PNDS),  is so named because it's frequently impossible to sort out how much is  CR/sinusitis with freq throat clearing (which can be related to primary GERD)   vs  causing  secondary (" extra esophageal")  GERD from wide swings in gastric pressure that occur with throat clearing, often  promoting self use of mint and menthol lozenges that reduce the lower esophageal sphincter tone and exacerbate the problem further in a cyclical fashion.   These are the same pts (now being labeled as having "irritable larynx syndrome" by some cough centers) who not infrequently have a history of having failed to tolerate ace inhibitors,  dry powder inhalers or biphosphonates or report having atypical/extraesophageal reflux symptoms that don't respond to standard doses of PPI  and are easily confused as having aecopd or asthma flares by even experienced allergists/ pulmonologists (myself included).    Of the three most common causes of  Sub-acute / recurrent or chronic cough, only one (GERD)  can  actually contribute to/ trigger  the other two (asthma and post nasal drip syndrome)  and perpetuate the cylce of cough.  While not intuitively obvious, many patients with chronic low grade reflux do not cough until there is a primary insult that disturbs the protective epithelial barrier and exposes sensitive nerve endings.   This is typically viral but can due to PNDS and  either may apply here.     >>>> The point is that once this occurs, it is difficult to eliminate the cycle  using anything but a maximally effective acid suppression regimen at least in the short run, accompanied by an appropriate diet to address non acid GERD and control / eliminate the cough itself for at least 3 days with tramadol or demerol short term if she can tolerate it or gabapentin long run if she can't do the short term effectively.  In meantime can finish the levaquin/ continue the singulair and finish the prednisone with saba prn wheeze but not to be used for cough and she should certainly not use anything to force herself to cough as this has been part of her problem to date.    - The proper method of use, as well as anticipated side effects, of a metered-dose inhaler are discussed and demonstrated to the patient.    Advised pt and daughter Rosendo Gros: The standardized cough guidelines published in Chest by Lissa Morales in 2006 are still the best available and consist of a multiple step process (up to 12!) , not a single office visit,  and are intended  to address this problem logically,  with an alogrithm dependent on response to empiric treatment at  each progressive step  to determine a specific diagnosis with  minimal addtional testing needed. Therefore if adherence is an issue or can't be accurately verified,  it's very unlikely the standard evaluation and treatment will be successful here so when returns should bring all meds in hand using a trust but verify approach to confirm  accurate Medication  Reconciliation The  principal here is that until we are certain that the  patients are doing what we've asked, it makes no sense to ask them to do more.   Furthermore, response to therapy (other than acute cough suppression, which should only be used short term with avoidance of narcotic containing cough syrups if possible), can be a gradual process for which the patient is not likely to  perceive immediate benefit.  Unlike going to an eye doctor where the best perscription is almost always the first one and is immediately effective, this is almost never the case in the management of chronic cough syndromes. Therefore the patient needs to commit up front to consistently adhere to recommendations  for up to 6 weeks of therapy directed at the likely underlying problem(s) before the response can be reasonably evaluated.     Total time devoted to counseling  > 50 % of initial 60 min office visit:  review case with pt/daughter /   device teaching which extended face to face time for this visit /  discussion of options/alternatives/ personally creating written customized instructions  in presence of pt  then going over those specific  Instructions directly with the pt including how to use all of the meds but in particular covering each new medication in detail and the difference between the maintenance= "automatic" meds and the prns using an action plan format for the latter (If this problem/symptom => do that organization reading Left to right).  Please see AVS from this visit for a full list of these instructions which I personally wrote for this pt and  are unique to this visit.      Christinia Gully, MD 04/17/2018

## 2018-04-18 ENCOUNTER — Encounter: Payer: Self-pay | Admitting: Internal Medicine

## 2018-04-18 DIAGNOSIS — R058 Other specified cough: Secondary | ICD-10-CM | POA: Insufficient documentation

## 2018-04-18 DIAGNOSIS — R05 Cough: Secondary | ICD-10-CM | POA: Insufficient documentation

## 2018-04-18 HISTORY — DX: Other specified cough: R05.8

## 2018-04-18 NOTE — Assessment & Plan Note (Addendum)
Onset mid sept 2019 - Allergy profile  02/28/2018    IgE  312  RAST neg    - 04/17/2018 d/c breo/ max rx for gerd/ cyclical cough   The hoarseness and cough to point of gagging are typical of  Upper airway cough syndrome (previously labeled PNDS),  is so named because it's frequently impossible to sort out how much is  CR/sinusitis with freq throat clearing (which can be related to primary GERD)   vs  causing  secondary (" extra esophageal")  GERD from wide swings in gastric pressure that occur with throat clearing, often  promoting self use of mint and menthol lozenges that reduce the lower esophageal sphincter tone and exacerbate the problem further in a cyclical fashion.   These are the same pts (now being labeled as having "irritable larynx syndrome" by some cough centers) who not infrequently have a history of having failed to tolerate ace inhibitors,  dry powder inhalers or biphosphonates or report having atypical/extraesophageal reflux symptoms that don't respond to standard doses of PPI  and are easily confused as having aecopd or asthma flares by even experienced allergists/ pulmonologists (myself included).    Of the three most common causes of  Sub-acute / recurrent or chronic cough, only one (GERD)  can actually contribute to/ trigger  the other two (asthma and post nasal drip syndrome)  and perpetuate the cylce of cough.  While not intuitively obvious, many patients with chronic low grade reflux do not cough until there is a primary insult that disturbs the protective epithelial barrier and exposes sensitive nerve endings.   This is typically viral but can due to PNDS and  either may apply here.     >>>> The point is that once this occurs, it is difficult to eliminate the cycle  using anything but a maximally effective acid suppression regimen at least in the short run, accompanied by an appropriate diet to address non acid GERD and control / eliminate the cough itself for at least 3 days with  tramadol or demerol short term if she can tolerate it or gabapentin long run if she can't do the short term effectively.  In meantime can finish the levaquin/ continue the singulair and finish the prednisone with saba prn wheeze but not to be used for cough and she should certainly not use anything to force herself to cough as this has been part of her problem to date.    - The proper method of use, as well as anticipated side effects, of a metered-dose inhaler are discussed and demonstrated to the patient.    Advised pt and daughter Rosendo Gros: The standardized cough guidelines published in Chest by Lissa Morales in 2006 are still the best available and consist of a multiple step process (up to 12!) , not a single office visit,  and are intended  to address this problem logically,  with an alogrithm dependent on response to empiric treatment at  each progressive step  to determine a specific diagnosis with  minimal addtional testing needed. Therefore if adherence is an issue or can't be accurately verified,  it's very unlikely the standard evaluation and treatment will be successful here so when returns should bring all meds in hand using a trust but verify approach to confirm accurate Medication  Reconciliation The principal here is that until we are certain that the  patients are doing what we've asked, it makes no sense to ask them to do more.   Furthermore, response to  therapy (other than acute cough suppression, which should only be used short term with avoidance of narcotic containing cough syrups if possible), can be a gradual process for which the patient is not likely to  perceive immediate benefit.  Unlike going to an eye doctor where the best perscription is almost always the first one and is immediately effective, this is almost never the case in the management of chronic cough syndromes. Therefore the patient needs to commit up front to consistently adhere to recommendations  for up to 6 weeks  of therapy directed at the likely underlying problem(s) before the response can be reasonably evaluated.     Total time devoted to counseling  > 50 % of initial 60 min office visit:  review case with pt/daughter /   device teaching which extended face to face time for this visit /  discussion of options/alternatives/ personally creating written customized instructions  in presence of pt  then going over those specific  Instructions directly with the pt including how to use all of the meds but in particular covering each new medication in detail and the difference between the maintenance= "automatic" meds and the prns using an action plan format for the latter (If this problem/symptom => do that organization reading Left to right).  Please see AVS from this visit for a full list of these instructions which I personally wrote for this pt and  are unique to this visit.

## 2018-04-24 ENCOUNTER — Other Ambulatory Visit: Payer: Self-pay | Admitting: Gastroenterology

## 2018-04-24 DIAGNOSIS — R935 Abnormal findings on diagnostic imaging of other abdominal regions, including retroperitoneum: Secondary | ICD-10-CM | POA: Diagnosis not present

## 2018-04-24 DIAGNOSIS — R634 Abnormal weight loss: Secondary | ICD-10-CM

## 2018-04-24 DIAGNOSIS — E278 Other specified disorders of adrenal gland: Secondary | ICD-10-CM | POA: Diagnosis not present

## 2018-04-24 DIAGNOSIS — R05 Cough: Secondary | ICD-10-CM | POA: Diagnosis not present

## 2018-04-24 DIAGNOSIS — K654 Sclerosing mesenteritis: Secondary | ICD-10-CM | POA: Diagnosis not present

## 2018-04-28 ENCOUNTER — Ambulatory Visit: Payer: Medicare Other | Admitting: Pulmonary Disease

## 2018-04-30 ENCOUNTER — Ambulatory Visit
Admission: RE | Admit: 2018-04-30 | Discharge: 2018-04-30 | Disposition: A | Discharge status: Home / Self Care | Payer: Medicare Other | Source: Ambulatory Visit | Attending: Family Medicine | Admitting: Family Medicine

## 2018-04-30 DIAGNOSIS — E278 Other specified disorders of adrenal gland: Secondary | ICD-10-CM

## 2018-04-30 DIAGNOSIS — D3502 Benign neoplasm of left adrenal gland: Secondary | ICD-10-CM | POA: Diagnosis not present

## 2018-04-30 MED ORDER — IOPAMIDOL (ISOVUE-300) INJECTION 61%
100.0000 mL | Freq: Once | INTRAVENOUS | Status: AC | PRN
  Administered 2018-04-30: 100 mL via INTRAVENOUS

## 2018-05-02 ENCOUNTER — Ambulatory Visit (INDEPENDENT_AMBULATORY_CARE_PROVIDER_SITE_OTHER): Payer: Medicare Other | Admitting: Primary Care

## 2018-05-02 ENCOUNTER — Ambulatory Visit
Admission: RE | Admit: 2018-05-02 | Discharge: 2018-05-02 | Disposition: A | Discharge status: Home / Self Care | Payer: Medicare Other | Source: Ambulatory Visit | Attending: Gastroenterology | Admitting: Gastroenterology

## 2018-05-02 ENCOUNTER — Ambulatory Visit: Payer: Medicare Other | Admitting: Internal Medicine

## 2018-05-02 ENCOUNTER — Encounter: Payer: Self-pay | Admitting: Primary Care

## 2018-05-02 VITALS — BP 134/76 | HR 95 | Ht 61.75 in | Wt 206.0 lb

## 2018-05-02 DIAGNOSIS — R05 Cough: Secondary | ICD-10-CM | POA: Diagnosis not present

## 2018-05-02 DIAGNOSIS — K449 Diaphragmatic hernia without obstruction or gangrene: Secondary | ICD-10-CM | POA: Diagnosis not present

## 2018-05-02 DIAGNOSIS — R059 Cough, unspecified: Secondary | ICD-10-CM

## 2018-05-02 DIAGNOSIS — R058 Other specified cough: Secondary | ICD-10-CM

## 2018-05-02 DIAGNOSIS — R634 Abnormal weight loss: Secondary | ICD-10-CM

## 2018-05-02 DIAGNOSIS — K7689 Other specified diseases of liver: Secondary | ICD-10-CM | POA: Diagnosis not present

## 2018-05-02 MED ORDER — AZELASTINE-FLUTICASONE 137-50 MCG/ACT NA SUSP: 1.0000 | Freq: Two times a day (BID) | NASAL | 3 refills | Status: DC

## 2018-05-02 MED ORDER — MOMETASONE FUROATE 100 MCG/ACT IN AERO: 2.0000 | INHALATION_SPRAY | Freq: Two times a day (BID) | RESPIRATORY_TRACT | 0 refills | Status: DC

## 2018-05-02 NOTE — Progress Notes (Signed)
@Patient  ID: Sarah Miller, female    DOB: Jun 06, 1945, 73 y.o.   MRN: [id number]  Chief Complaint  Patient presents with  . Follow-up    upper airway cough    Referring provider: Harlan Stains, MD  HPI: 73 year old female, former smoker (quit 2018). PMH RBBB, upper airway cough syndrome. Previous patient of Dr. Ander Slade, sought second opinion with Dr. Melvyn Novas seen on 04/17/18. IgE 213, Rast neg. D/c Breo, max therapy for GERD/cyclic cough   03/31/6331 Patient presents today for 2 week follow-up. Originally felt some better but didn't last. Complains of dry/congested cough. Associated nasal congestion and wheezing. Patient states that she feel very worn out. Wheezing is driving her crazy and worse when she lies down. Finish prednisone and Levaquin course. Had upper GI this morning, she's tired and feels crummy. Reports weight loss. Weight 206 today; 211 lbs on 02/28/18 and 212lbs in 2015. Unable to due FENO.  Allergies  Allergen Reactions  . Other     hydocan cough syrup causes nausea  . Sertraline Hcl Diarrhea  . Penicillins Rash     There is no immunization history on file for this patient.  Past Medical History:  Diagnosis Date  . Allergic rhinitis   . Cancer [facility])    h/o melanoma '78 and 79  . Cervical arthritis    Dr Delman Kitten, S/P injection with good results.   . Depression    and anxiety  . Diabetes mellitus without complication ([place])   . Early menopause   . Frozen shoulder    H/O frozen shoulder and rotator cuff tendonititis, L s/p injection  . Sleep apnea   . Vitamin D deficiency     Tobacco History: Social History   Tobacco Use  Smoking Status Former Smoker  . Packs/day: 0.75  . Years: 53.00  . Pack years: 39.75  . Types: Cigarettes  . Last attempt to quit: 02/27/2016  . Years since quitting: 2.1  Smokeless Tobacco Never Used   Counseling given: Not Answered   Outpatient Medications Prior to Visit  Medication Sig Dispense Refill  . albuterol  (PROVENTIL HFA;VENTOLIN HFA) 108 (90 Base) MCG/ACT inhaler Inhale 2 puffs into the lungs every 6 (six) hours as needed for wheezing or shortness of breath. 1 Inhaler 6  . atorvastatin (LIPITOR) 10 MG tablet Take 10 mg by mouth daily.  3  . buPROPion (WELLBUTRIN XL) 150 MG 24 hr tablet Take 150 mg by mouth daily. Take with 300 mg to equal 450 mg  1  . buPROPion (WELLBUTRIN XL) 300 MG 24 hr tablet Take 300 mg by mouth daily. Take with 150 mg to equal 450 mg    . fluticasone (FLONASE) 50 MCG/ACT nasal spray Place 2 sprays into both nostrils daily. 16 g 2  . ibuprofen (ADVIL,MOTRIN) 200 MG tablet Take 200 mg by mouth every 6 (six) hours as needed.    . metFORMIN (GLUCOPHAGE-XR) 500 MG 24 hr tablet Take 2,000 mg by mouth every evening.   1  . montelukast (SINGULAIR) 10 MG tablet Take 1 tablet (10 mg total) by mouth at bedtime. 30 tablet 11  . omeprazole (PRILOSEC) 40 MG capsule Take 40 mg by mouth daily.    . traMADol (ULTRAM) 50 MG tablet Take 1 tablet (50 mg total) by mouth every 4 (four) hours as needed. 40 tablet 0  . predniSONE (DELTASONE) 10 MG tablet Take 2 tablets (20 mg total) by mouth daily with breakfast. 20 tablet 0  . levofloxacin (LEVAQUIN)  500 MG tablet Take 1 tablet (500 mg total) by mouth daily. 7 tablet 0   No facility-administered medications prior to visit.     Review of Systems  Review of Systems  Constitutional: Negative.   HENT: Negative.   Respiratory: Positive for cough and wheezing.   Cardiovascular: Negative.     Physical Exam  BP 134/76 (BP Location: Left Arm, Cuff Size: Normal)   Pulse 95   Ht 5' 1.75" (1.568 m)   Wt 206 lb (93.4 kg)   SpO2 93%   BMI 37.98 kg/m  Physical Exam Constitutional:      General: She is not in acute distress.    Appearance: Normal appearance. She is not ill-appearing.  HENT:     Right Ear: Tympanic membrane normal.     Left Ear: Tympanic membrane normal.     Mouth/Throat:     Mouth: Mucous membranes are moist.     Pharynx:  Oropharynx is clear.  Pulmonary:     Effort: Pulmonary effort is normal.     Breath sounds: Wheezing present.  Neurological:     Mental Status: She is alert.      Lab Results:  CBC    Component Value Date/Time   WBC 9.9 11/14/2017 0416   RBC 4.36 11/14/2017 0416   HGB 11.7 (L) 11/14/2017 0416   HCT 38.2 11/14/2017 0416   PLT 270 11/14/2017 0416   MCV 87.6 11/14/2017 0416   MCH 26.8 11/14/2017 0416   MCHC 30.6 11/14/2017 0416   RDW 14.7 11/14/2017 0416    BMET    Component Value Date/Time   NA 141 11/14/2017 0416   K 3.8 11/14/2017 0416   CL 100 11/14/2017 0416   CO2 28 11/14/2017 0416   GLUCOSE 129 (H) 11/14/2017 0416   BUN 12 11/14/2017 0416   CREATININE 0.87 11/14/2017 0416   CALCIUM 9.0 11/14/2017 0416   GFRNONAA >60 11/14/2017 0416   GFRAA >60 11/14/2017 0416    BNP No results found for: BNP  ProBNP No results found for: PROBNP  Imaging: Ct Abdomen Pelvis W Contrast  Result Date: 04/07/2018 CLINICAL DATA:  30 lb weight loss in past 2 months. Nausea. Personal history of melanoma. EXAM: CT ABDOMEN AND PELVIS WITH CONTRAST TECHNIQUE: Multidetector CT imaging of the abdomen and pelvis was performed using the standard protocol following bolus administration of intravenous contrast. CONTRAST:  130mL ISOVUE-300 IOPAMIDOL (ISOVUE-300) INJECTION 61% COMPARISON:  None. FINDINGS: Lower Chest: No acute findings. Hepatobiliary: No hepatic masses identified. Mild hypertrophy of caudate and left hepatic lobes is seen relative to the right lobe, which can be seen with early cirrhosis. Gallbladder is unremarkable. No evidence of biliary ductal dilatation. Pancreas:  No mass or inflammatory changes. Spleen: Within normal limits in size. 10 mm benign-appearing cyst noted in the posterior aspect of the spleen. Adrenals/Urinary Tract: A 2.1 cm left adrenal mass is seen which is indeterminate. Tiny sub-cm right renal cyst. No renal masses identified. No evidence of hydronephrosis.  Stomach/Bowel: No evidence of bowel obstruction or bowel wall thickening. Normal appendix visualized. A focal area of ill-defined opacity is seen within the right abdominal small bowel mesentery, without evidence of calcification or solid component. This is suspicious for mild panniculitis. Vascular/Lymphatic: No pathologically enlarged lymph nodes. No abdominal aortic aneurysm. Aortic atherosclerosis. Reproductive: Prior hysterectomy noted. Adnexal regions are unremarkable in appearance. Other:  No evidence of ascites. Musculoskeletal:  No suspicious bone lesions identified. IMPRESSION: 1. Focal ill-defined opacity in the right abdominal small bowel mesentery,  suspicious for panniculitis. Recommend continued follow-up by CT in 4-6 months. 2. Question early/mild hepatic cirrhosis. Recommend correlation with liver function tests and hepatic serology. 3. Indeterminate left adrenal mass. Recommend further characterization with adrenal protocol abdomen CT without and with contrast. This recommendation follows ACR consensus guidelines: Managing Incidental Findings on Abdominal CT: White Paper of the ACR Incidental Findings Committee. J Am Coll Radiol 2010;7:754-773. Electronically Signed   By: Earle Gell M.D.   On: 04/07/2018 10:20   Ct Abdomen W Wo Contrast  Result Date: 04/30/2018 CLINICAL DATA:  LEFT adrenal mass EXAM: CT ABDOMEN WITHOUT AND WITH CONTRAST TECHNIQUE: Multidetector CT imaging of the abdomen was performed following the standard protocol before and following the bolus administration of intravenous contrast. CONTRAST:  120mL ISOVUE-300 IOPAMIDOL (ISOVUE-300) INJECTION 61% COMPARISON:  CT 04/07/2018 FINDINGS: Lower chest:  Lung bases are clear. Hepatobiliary: No focal hepatic lesion.  Gallbladder normal. Pancreas: Normal pancreatic parenchymal intensity. No ductal dilatation or inflammation. Spleen: Normal spleen. Adrenals/urinary tract: Low-density lesion of the LEFT adrenal gland measuring 15 mm  (image 37/2). Lesion has low attenuation consistent benign adenoma (HU equal negative 1 point 1.5 Stomach/Bowel: Stomach and limited of the small bowel is unremarkable Vascular/Lymphatic: Abdominal aortic normal caliber. No retroperitoneal periportal lymphadenopathy. Musculoskeletal: No aggressive osseous lesion IMPRESSION: Benign LEFT adrenal adenoma. Electronically Signed   By: Suzy Bouchard M.D.   On: 04/30/2018 15:15   Dg Duanne Limerick W Double Cm (hd Ba)  Result Date: 05/02/2018 CLINICAL DATA:  Weight loss EXAM: UPPER GI SERIES WITH KUB TECHNIQUE: After obtaining a scout radiograph a routine upper GI series was performed using thin and high density barium. FLUOROSCOPY TIME:  Fluoroscopy Time:  1 minutes 6 seconds Radiation Exposure Index (if provided by the fluoroscopic device): Number of Acquired Spot Images: 0 COMPARISON:  CT abdomen pelvis 04/30/2018 FINDINGS: KUB demonstrates normal bowel gas pattern. Contrast in the colon from prior CT. Mild esophageal dysmotility. Negative for stricture or mass. Small hiatal hernia with mild gastroesophageal reflux. Barium tablet passed readily into the stomach without delay Normal stomach. No ulcer or mass. Normal emptying of the stomach into the duodenum without ulcer or edema. Small duodenal diverticulum in the second and third portions of the duodenum. IMPRESSION: 1. Small hiatal hernia with mild gastroesophageal reflux. Mild esophageal dysmotility without stricture or mass 2. Negative stomach and duodenum.  Small duodenal diverticula. Electronically Signed   By: Franchot Gallo M.D.   On: 05/02/2018 10:05   [place] Elastography Liver  Result Date: 05/02/2018 CLINICAL DATA:  Weight loss. EXAM: US LIVER ELASTOGRAPHY TECHNIQUE: Ultrasound elastography evaluation of the liver was performed. A region of interest was placed in the right lobe of the liver. Following application of a compressive sonographic pulse, shear waves were detected in the adjacent hepatic tissue and the  shear wave velocity was calculated. Multiple assessments were performed at the selected site. Median shear wave velocity is correlated to a Metavir fibrosis score. COMPARISON:  CT on 04/30/2018 FINDINGS: Liver: No focal lesion identified. Mildly increased and heterogeneous parenchymal echotexture, suspicious for mild steatosis. Portal vein is patent on color Doppler imaging with normal direction of blood flow towards the liver. ULTRASOUND HEPATIC ELASTOGRAPHY Device: Siemens Helix VTQ Patient position: Oblique Transducer: [id number] Number of measurements: 10 Hepatic segment:  8 Median velocity:   0.71 m/sec IQR: 0.15 IQR/Median velocity ratio: 0.21 Corresponding Metavir fibrosis score:  F0/F1 Risk of fibrosis: Minimal Limitations of exam: None Please note that abnormal shear wave velocities may also be identified in clinical  settings other than with hepatic fibrosis, such as: acute hepatitis, elevated right heart and central venous pressures including use of beta blockers, veno-occlusive disease (Budd-Chiari), infiltrative processes such as mastocytosis/amyloidosis/infiltrative tumor, extrahepatic cholestasis, in the post-prandial state, and liver transplantation. Correlation with patient history, laboratory data, and clinical condition recommended. IMPRESSION: Liver: Mildly increased and heterogeneous hepatic echotexture, suspicious for mild steatosis. No evidence of hepatic mass. Elastography: Median hepatic shear wave velocity is calculated at 0.71 m/sec. Corresponding Metavir fibrosis score is F0/F1. Risk of fibrosis is Minimal. Follow-up: None required Electronically Signed   By: Earle Gell M.D.   On: 05/02/2018 10:06     Assessment & Plan:   Upper airway cough syndrome - Complains of wheezing, dry cough and nasal congestion  - Allergy profile 02/28/2018 IgE 312, RAST neg - BREO discontinued - Adding Asmanex 100, two puffs bid - Adding Dymista (Fluticasone + Azelastine) nasal spray 1 puff per nostril twice  daily  - Continue Omeprazole twice daily for GERD symptoms, if EGD was normal and you do not have heart burn symptoms ok to stop - Cough recommendations below - FU in 4-6 weeks with Dr. Melvyn Novas or NP with full PFTs prior   Recommendations: - NO MINT OR MENTHOL PRODUCTS  - USE SUGARLESS CANDY INSTEAD (Jolley ranchers or Stover's or Astronomer) or even ice chips will also do - the key is to swallow to prevent all throat clearing - NO OIL BASED VITAMINS  - Take Delsym two tsp every 12 hours with tramadol 50 mg up to 1 every 4 hours to suppress the urge to cough - Once you have eliminated the cough for 3 straight days try reducing the tramadol first and then the delsym as tolerated     Martyn Ehrich, NP 05/05/2018

## 2018-05-02 NOTE — Patient Instructions (Addendum)
Adding Asmanex 100 - take 2 puffs twice daily until next follow-up   Adding Dymista (Fluticasone + Azelastine) nasal spray 1 puff per nostril twice daily - if too expensive ask pharmacy to help you get equivalent   Continue Omeprazole twice daily for GERD symptoms, if EGD was normal and you do not have heart burn symptoms ok to stop  Recommendations: - NO MINT OR MENTHOL PRODUCTS  - USE SUGARLESS CANDY INSTEAD (Jolley ranchers or Stover's or Astronomer) or even ice chips will also do - the key is to swallow to prevent all throat clearing - NO OIL BASED VITAMINS   - Take Delsym two tsp every 12 hours with tramadol 50 mg up to 1 every 4 hours to suppress the urge to cough - Once you have eliminated the cough for 3 straight days try reducing the tramadol first and then the delsym as tolerated   Follow-up: 4-6 weeks with Dr. Melvyn Novas or NP with full PFTs prior

## 2018-05-05 ENCOUNTER — Encounter: Payer: Self-pay | Admitting: Primary Care

## 2018-05-05 NOTE — Assessment & Plan Note (Signed)
-   Complains of wheezing, dry cough and nasal congestion  - Allergy profile 02/28/2018 IgE 312, RAST neg - BREO discontinued - Adding Asmanex 100, two puffs bid - Adding Dymista (Fluticasone + Azelastine) nasal spray 1 puff per nostril twice daily  - Continue Omeprazole twice daily for GERD symptoms, if EGD was normal and you do not have heart burn symptoms ok to stop - Cough recommendations below - FU in 4-6 weeks with Dr. Melvyn Novas or NP with full PFTs prior   Recommendations: - NO MINT OR MENTHOL PRODUCTS  - USE SUGARLESS CANDY INSTEAD ([place] or Stover's or Astronomer) or even ice chips will also do - the key is to swallow to prevent all throat clearing - NO OIL BASED VITAMINS  - Take Delsym two tsp every 12 hours with tramadol 50 mg up to 1 every 4 hours to suppress the urge to cough - Once you have eliminated the cough for 3 straight days try reducing the tramadol first and then the delsym as tolerated

## 2018-05-06 NOTE — Telephone Encounter (Signed)
Per 3.6.2020 AVS with Volanda Napoleon NP: Patient Instructions   Adding Asmanex 100 - take 2 puffs twice daily until next follow-up    Adding Dymista (Fluticasone + Azelastine) nasal spray 1 puff per nostril twice daily - if too expensive ask pharmacy to help you get equivalent    Continue Omeprazole twice daily for GERD symptoms, if EGD was normal and you do not have heart burn symptoms ok to stop   Recommendations: - NO MINT OR MENTHOL PRODUCTS  - USE SUGARLESS CANDY INSTEAD (Jolley ranchers or Stover's or Astronomer) or even ice chips will also do - the key is to swallow to prevent all throat clearing - NO OIL BASED VITAMINS    - Take Delsym two tsp every 12 hours with tramadol 50 mg up to 1 every 4 hours to suppress the urge to cough - Once you have eliminated the cough for 3 straight days try reducing the tramadol first and then the delsym as tolerated     Follow-up: 4-6 weeks with Dr. Melvyn Novas or NP with full PFTs prior      Called [facility] and spoke with pharmacist Estill Bamberg, Rx given verbally E-mail sent to patient Nothing further needed; will sign off

## 2018-05-09 DIAGNOSIS — R05 Cough: Secondary | ICD-10-CM | POA: Diagnosis not present

## 2018-05-09 DIAGNOSIS — E1169 Type 2 diabetes mellitus with other specified complication: Secondary | ICD-10-CM | POA: Diagnosis not present

## 2018-05-09 DIAGNOSIS — F3341 Major depressive disorder, recurrent, in partial remission: Secondary | ICD-10-CM | POA: Diagnosis not present

## 2018-06-02 ENCOUNTER — Ambulatory Visit: Payer: Medicare Other | Admitting: Primary Care

## 2018-06-04 ENCOUNTER — Other Ambulatory Visit: Payer: Self-pay | Admitting: Acute Care

## 2018-06-04 DIAGNOSIS — Z122 Encounter for screening for malignant neoplasm of respiratory organs: Secondary | ICD-10-CM

## 2018-06-04 DIAGNOSIS — Z87891 Personal history of nicotine dependence: Secondary | ICD-10-CM

## 2018-06-13 DIAGNOSIS — F3341 Major depressive disorder, recurrent, in partial remission: Secondary | ICD-10-CM | POA: Diagnosis not present

## 2018-06-13 DIAGNOSIS — E440 Moderate protein-calorie malnutrition: Secondary | ICD-10-CM | POA: Diagnosis not present

## 2018-06-13 DIAGNOSIS — R05 Cough: Secondary | ICD-10-CM | POA: Diagnosis not present

## 2018-06-13 DIAGNOSIS — J439 Emphysema, unspecified: Secondary | ICD-10-CM | POA: Diagnosis not present

## 2018-06-13 DIAGNOSIS — J309 Allergic rhinitis, unspecified: Secondary | ICD-10-CM | POA: Diagnosis not present

## 2018-06-13 DIAGNOSIS — E1169 Type 2 diabetes mellitus with other specified complication: Secondary | ICD-10-CM | POA: Diagnosis not present

## 2018-08-01 MED ORDER — MOMETASONE FUROATE 100 MCG/ACT IN AERO: 2.0000 | INHALATION_SPRAY | Freq: Two times a day (BID) | RESPIRATORY_TRACT | 0 refills | Status: DC

## 2018-08-11 ENCOUNTER — Telehealth: Payer: Self-pay | Admitting: Internal Medicine

## 2018-08-11 NOTE — Telephone Encounter (Signed)
Pulmicort hfa two puffs twice daily  Cc:Dr. Melvyn Novas

## 2018-08-11 NOTE — Telephone Encounter (Signed)
Which would you like sent 90 or 180 Pulmicort HFA?

## 2018-08-11 NOTE — Telephone Encounter (Signed)
[facility] PA request Spoke with Herbie [place] re: Asmanex which is not on pt formulary. Formulary covered drugs are: Pulmicort HFA Flovent Diskus Arnuity Ellipta  MW which would you like to switch to?

## 2018-08-11 NOTE — Telephone Encounter (Signed)
Another message already sent to np. Medication pending change. Closing encounter.

## 2018-08-11 NOTE — Telephone Encounter (Signed)
90 please

## 2018-08-11 NOTE — Telephone Encounter (Signed)
Called and made aware insurance will not cover asmanex Awaiting change of inhaler from physician.

## 2018-08-12 MED ORDER — PULMICORT FLEXHALER 90 MCG/ACT IN AEPB: 2.0000 | INHALATION_SPRAY | Freq: Two times a day (BID) | RESPIRATORY_TRACT | 2 refills | Status: DC

## 2018-08-12 NOTE — Telephone Encounter (Signed)
Returned call to patient's daughter Rosendo Gros and made aware of inhaler change. Asked if she wanted me to contact pt. She says yes in case she had any questions but she would make her aware as well. ATC pt no answer no machine. Asmanex inhaler being changed to Pulmicort due to insurance not covering. Made daughter aware to call office is pt is having trouble with inhaler. Nothing further needed

## 2018-08-14 ENCOUNTER — Telehealth: Payer: Self-pay

## 2018-08-14 ENCOUNTER — Telehealth: Payer: Self-pay | Admitting: Primary Care

## 2018-08-14 NOTE — Telephone Encounter (Signed)
Medication has already been changed per telephone encounter dated 08/11/2018. Message will be closed.

## 2018-08-14 NOTE — Telephone Encounter (Signed)
PA form with Dr Melvyn Novas recommendations being faxed to [place].  Will leave message open until PA determination received.

## 2018-08-14 NOTE — Telephone Encounter (Signed)
Received fax from [place] for PA on pt's asmanex.  We have a diagnosis of cough/upper airway cough syndrome.  Dr. Melvyn Novas, please advise if there is another diagnosis that we could use for the PA (asthma, COPD, etc) that we could use as a qualifying diagnosis for the asmanex as it does not seem that cough/upper airway cough syndrome is considered a qualifying diagnosis.  Also, there are medications that can be requested as covered alternatives to the asmanex. These include:  Budesonide 0.25mg /46ml neb sol Budesonide 0.5mg /62ml neb sol Flovent Diskus Flovent HFA Pulmicort Flexhaler Arnuity Ellipta

## 2018-08-14 NOTE — Telephone Encounter (Signed)
Try cough variant asthma  If they won't do asmanex can try flovent 110 hf 2 bid

## 2018-08-14 NOTE — Telephone Encounter (Signed)
Received a call from [place] with [facility]. Sarah Miller stated they received the PA form that was completed for pt's asmanex inhaler. Per Sarah Miller, the only diagnosis that was stated on the PA form was cough or upper airway cough syndrome. Sarah Miller is needing to know if pt has any other diagnosis of asthma or COPD that might be able to cover the asmanex as the diagnosis of cough might not cover the asmanex inhaler.  I stated to Sarah Miller that we could have the provider readdress this. Sarah Miller said he would resend the PA form to our office for [place] to relook at to see if the providers can see if there is any other diagnosis other than cough that might cover the inhalers. Sarah Miller also stated to have provider look at the alternative inhalers to see if they might want to switch pt to those instead of the asmanex if there is no other diagnosis for pt other than cough.  Will await the fax from [place].

## 2018-08-15 ENCOUNTER — Telehealth: Payer: Self-pay | Admitting: Primary Care

## 2018-08-15 NOTE — Telephone Encounter (Signed)
Several messages in chart regarding same matter. Called and spoke to [place], pts daughter. Rosendo Gros states the pt tolerates Asmanex HFA the best. Per previous messages insurance does not cover this, pt was sent in Pulmicort 90 flexhaler, which is a powder, pt did not tolerate this. Another message in pt's chart shows that a PA was initiated for Asmanex 100 HFA. The pt wishes to stay on this if possible and would like to stop the Pulmicort and stay on Asmanex. We have samples of Asmanex and can give this until the PA decision returns. Two samples of Asmanex 100 hfa have been left up front to be picked up.   Will send this to Dr. Melvyn Novas as Juluis Rainier, letting him know that pt is no longer on the Pulmicort and is back on the Asmanex (as originally prescribed)  until a determination has been made on the PA.

## 2018-08-18 ENCOUNTER — Telehealth: Payer: Self-pay | Admitting: Primary Care

## 2018-08-18 DIAGNOSIS — J439 Emphysema, unspecified: Secondary | ICD-10-CM | POA: Diagnosis not present

## 2018-08-18 DIAGNOSIS — M793 Panniculitis, unspecified: Secondary | ICD-10-CM | POA: Diagnosis not present

## 2018-08-18 DIAGNOSIS — J0111 Acute recurrent frontal sinusitis: Secondary | ICD-10-CM | POA: Diagnosis not present

## 2018-08-18 DIAGNOSIS — E1169 Type 2 diabetes mellitus with other specified complication: Secondary | ICD-10-CM | POA: Diagnosis not present

## 2018-08-18 DIAGNOSIS — R05 Cough: Secondary | ICD-10-CM | POA: Diagnosis not present

## 2018-08-18 DIAGNOSIS — E113293 Type 2 diabetes mellitus with mild nonproliferative diabetic retinopathy without macular edema, bilateral: Secondary | ICD-10-CM | POA: Diagnosis not present

## 2018-08-18 NOTE — Telephone Encounter (Signed)
No need for additional rx but we need to see by mid week next week if not beter

## 2018-08-18 NOTE — Telephone Encounter (Signed)
Called and spoke with pt who stated she had a televisit with PCP. Due to pt coughing for a week or more and also having head congestion, PCP called in zpak for pt.  Pt also has had some wheezing going on which she states has been going on for also about a week.  Pt states she has had a sinus headache and due to head congestion and issues with sinuses, her taste and smell has been a little off. Pt states that she has had SOB with activities.  Pt denies any complaints of fever. Pt wants to know if there are any other recommendations.  Dr. Melvyn Novas, please advise on this for pt. Thanks!  Instructions   Return in about 6 weeks (around 06/13/2018). Adding Asmanex 100 - take 2 puffs twice daily until next follow-up   Adding Dymista (Fluticasone + Azelastine) nasal spray 1 puff per nostril twice daily - if too expensive ask pharmacy to help you get equivalent   Continue Omeprazole twice daily for GERD symptoms, if EGD was normal and you do not have heart burn symptoms ok to stop  Recommendations: - NO MINT OR MENTHOL PRODUCTS  - USE SUGARLESS CANDY INSTEAD (Jolley ranchers or Stover's or Astronomer) or even ice chips will also do - the key is to swallow to prevent all throat clearing - NO OIL BASED VITAMINS   - Take Delsym two tsp every 12 hours with tramadol 50 mgup to 1every 4 hours to suppress the urge to cough - Once you have eliminated the cough for 3 straight daystry reducing the tramadol first and then the delsym as tolerated   Follow-up: 4-6 weeks with Dr. Melvyn Novas or NP with full PFTs prior

## 2018-08-18 NOTE — Telephone Encounter (Signed)
Spoke with the pt and notified of recs per MW  She verbalized understanding  Will call if not improving

## 2018-08-19 NOTE — Telephone Encounter (Signed)
PA determination still has not been received. I checked the PA file and folders and it looks like the only thing they sent to our office was the same PA form that I had originally filled out. I have refaxed this back to [facility].

## 2018-08-20 ENCOUNTER — Other Ambulatory Visit: Payer: Self-pay | Admitting: Family Medicine

## 2018-08-20 DIAGNOSIS — M793 Panniculitis, unspecified: Secondary | ICD-10-CM

## 2018-08-20 NOTE — Telephone Encounter (Signed)
Waiting on PA determination from [facility]. PA determination not received at this time.

## 2018-08-21 NOTE — Telephone Encounter (Signed)
Call made to [facility], placed on hold for several minutes. Will await PA deterimination or try calling back next business day.

## 2018-08-22 NOTE — Telephone Encounter (Signed)
mychart message received from pt stating that [facility] has approved the PA that was done on her asmanex. Nothing further needed.

## 2018-08-26 ENCOUNTER — Other Ambulatory Visit: Payer: Self-pay | Admitting: Pulmonary Disease

## 2018-09-04 ENCOUNTER — Ambulatory Visit
Admission: RE | Admit: 2018-09-04 | Discharge: 2018-09-04 | Disposition: A | Discharge status: Home / Self Care | Payer: Medicare Other | Source: Ambulatory Visit | Attending: Family Medicine | Admitting: Family Medicine

## 2018-09-04 DIAGNOSIS — M793 Panniculitis, unspecified: Secondary | ICD-10-CM

## 2018-09-04 DIAGNOSIS — R634 Abnormal weight loss: Secondary | ICD-10-CM | POA: Diagnosis not present

## 2018-09-04 MED ORDER — IOPAMIDOL (ISOVUE-300) INJECTION 61%
125.0000 mL | Freq: Once | INTRAVENOUS | Status: AC | PRN
  Administered 2018-09-04: 100 mL via INTRAVENOUS

## 2018-09-26 DIAGNOSIS — E1169 Type 2 diabetes mellitus with other specified complication: Secondary | ICD-10-CM | POA: Diagnosis not present

## 2018-09-29 MED ORDER — ASMANEX HFA 100 MCG/ACT IN AERO: 2.0000 | INHALATION_SPRAY | Freq: Two times a day (BID) | RESPIRATORY_TRACT | 2 refills | Status: DC

## 2018-12-04 ENCOUNTER — Telehealth: Payer: Self-pay | Admitting: Internal Medicine

## 2018-12-05 NOTE — Telephone Encounter (Signed)
L/m on pt vm to call back to schedule pft -pr  °

## 2018-12-08 NOTE — Telephone Encounter (Signed)
Called and spoke to patient. Scheduled PFT, OV, and pre-procedure covid testing. Nothing further needed at this time.

## 2019-01-07 DIAGNOSIS — R05 Cough: Secondary | ICD-10-CM | POA: Diagnosis not present

## 2019-01-07 DIAGNOSIS — M545 Low back pain: Secondary | ICD-10-CM | POA: Diagnosis not present

## 2019-01-08 DIAGNOSIS — U071 COVID-19: Secondary | ICD-10-CM | POA: Diagnosis not present

## 2019-01-08 DIAGNOSIS — Z1159 Encounter for screening for other viral diseases: Secondary | ICD-10-CM | POA: Diagnosis not present

## 2019-01-13 ENCOUNTER — Other Ambulatory Visit: Payer: Self-pay | Admitting: Family Medicine

## 2019-01-13 ENCOUNTER — Other Ambulatory Visit: Payer: Self-pay

## 2019-01-13 ENCOUNTER — Ambulatory Visit
Admission: RE | Admit: 2019-01-13 | Discharge: 2019-01-13 | Disposition: A | Discharge status: Home / Self Care | Payer: Medicare Other | Source: Ambulatory Visit | Attending: Family Medicine | Admitting: Family Medicine

## 2019-01-13 DIAGNOSIS — M545 Low back pain, unspecified: Secondary | ICD-10-CM

## 2019-01-13 DIAGNOSIS — M25559 Pain in unspecified hip: Secondary | ICD-10-CM | POA: Diagnosis not present

## 2019-01-19 DIAGNOSIS — M4696 Unspecified inflammatory spondylopathy, lumbar region: Secondary | ICD-10-CM | POA: Diagnosis not present

## 2019-01-19 DIAGNOSIS — Z6841 Body Mass Index (BMI) 40.0 and over, adult: Secondary | ICD-10-CM | POA: Diagnosis not present

## 2019-01-19 DIAGNOSIS — M545 Low back pain: Secondary | ICD-10-CM | POA: Diagnosis not present

## 2019-01-27 ENCOUNTER — Other Ambulatory Visit: Payer: Self-pay | Admitting: Primary Care

## 2019-01-30 ENCOUNTER — Other Ambulatory Visit (HOSPITAL_COMMUNITY)
Admission: RE | Admit: 2019-01-30 | Discharge: 2019-01-30 | Disposition: A | Discharge status: Home / Self Care | Payer: Medicare Other | Source: Ambulatory Visit | Attending: Internal Medicine | Admitting: Internal Medicine

## 2019-01-30 ENCOUNTER — Other Ambulatory Visit: Payer: Self-pay

## 2019-01-30 DIAGNOSIS — Z20822 Contact with and (suspected) exposure to covid-19: Secondary | ICD-10-CM

## 2019-01-30 DIAGNOSIS — Z20828 Contact with and (suspected) exposure to other viral communicable diseases: Secondary | ICD-10-CM | POA: Diagnosis not present

## 2019-01-30 NOTE — Progress Notes (Signed)
Attempted to contact patient to come back to the Pre-procedure testing site because she went to the community site the patient's results will not be back before her procedure on 12/7

## 2019-01-31 LAB — NOVEL CORONAVIRUS, NAA: SARS-CoV-2, NAA: NOT DETECTED

## 2019-01-31 NOTE — Progress Notes (Signed)
Patient went through community testing site 12/4, can't guarantee results will be back by procedure scheduled for 12/4.  Tried to call patient to get her to come today.  Voice mail box full unable to leave a message.

## 2019-02-02 ENCOUNTER — Other Ambulatory Visit: Payer: Self-pay

## 2019-02-02 ENCOUNTER — Ambulatory Visit: Payer: Medicare Other | Admitting: Internal Medicine

## 2019-02-02 ENCOUNTER — Ambulatory Visit (INDEPENDENT_AMBULATORY_CARE_PROVIDER_SITE_OTHER): Payer: Medicare Other | Admitting: Internal Medicine

## 2019-02-02 ENCOUNTER — Encounter: Payer: Self-pay | Admitting: Internal Medicine

## 2019-02-02 DIAGNOSIS — R058 Other specified cough: Secondary | ICD-10-CM

## 2019-02-02 DIAGNOSIS — R0600 Dyspnea, unspecified: Secondary | ICD-10-CM

## 2019-02-02 DIAGNOSIS — R05 Cough: Secondary | ICD-10-CM | POA: Diagnosis not present

## 2019-02-02 DIAGNOSIS — R0609 Other forms of dyspnea: Secondary | ICD-10-CM

## 2019-02-02 DIAGNOSIS — R059 Cough, unspecified: Secondary | ICD-10-CM

## 2019-02-02 LAB — PULMONARY FUNCTION TEST
DL/VA % pred: 115 %
DL/VA: 4.86 ml/min/mmHg/L
DLCO unc % pred: 96 %
DLCO unc: 17.49 ml/min/mmHg
RV % pred: 114 %
RV: 2.41 L
TLC % pred: 89 %
TLC: 4.27 L

## 2019-02-02 NOTE — Patient Instructions (Signed)
Try albuterol 15 min before an activity that you know would make you short of breath and see if it makes any difference and if makes none then don't take it after activity unless you can't catch your breath.      If you find that using albuterol (Ventolin) really helps you with exertion  (vs the same activity without the ventolin) call us for a prescription for Dulera  100 2 every 12 hours     Please schedule a follow up office visit in 6 months, call sooner if needed

## 2019-02-02 NOTE — Progress Notes (Signed)
Sarah Miller, female    DOB: [birth year],   MRN: [id number]   Brief patient profile:  65 yowf quit smoking 02/2016 with pattern of violent sneezing going back to childhood and never had any lower resp problems but as an adult noted pattern of "bronchitis" ? Better p quit smoking but really sick in Sept 2019 > ER with midline cp better p rx omeprazole which seemed to help the cp but not the cough which persisted daily since = minimally productive more when lie down but present  24/7 and evolves sometimes into severe  choking spells  With w/u and Rx by Dr Ander Slade and sought second opinion so referred to me 04/17/2018.  Prev eval: rx Breo 200 variably reports "better right after take it because it helps me cough up stuff  ? How much?  = just a speck ? Better on prednisone but made sugars go up and losing wt with overt hyperglycemia and marked elevation of hgbA1c singulair addied 04/08/18         History of Present Illness  04/17/2018  Pulmonary/ 1st office eval/Sarah Miller  Chief Complaint  Patient presents with  . Follow-up    Second opinion on cough per Dr Ander Slade. Pt c/o cough since September 2019. Cough is occ prod with clear to yellow sputum.   Dyspnea:  Minimally doe Unless coughing    Cough: minimally productive no worse in am/ cough to point of gag  Sleep: sleeps on side / cough some but no gagging nocturnally  SABA use: no better with saba  Takes ppi with bfast, using mints/ cough drops  REC Ompeprazole 40 mg Take 30- 60 min before your first and last meals of the day  Stop Breo  GERD  Finish your prednisone  Take delsym two tsp every 12 hours and supplement if needed with  tramadol 50 mg up to 1 every 4 hours to suppress the urge to cough.     05/02/2018 Adding Asmanex 100 - take 2 puffs twice daily until next follow-up  Adding Dymista (Fluticasone + Azelastine) nasal spray 1 puff per nostril twice daily - if too expensive ask pharmacy to help you get equivalent  Continue  Omeprazole twice daily for GERD symptoms, if EGD was normal and you do not have heart burn symptoms ok to stop Recommendations: gerd diet  - Take Delsym two tsp every 12 hours with tramadol 50 mg up to 1 every 4 hours to suppress the urge to cough - Once you have eliminated the cough for 3 straight days try reducing the tramadol first and then the delsym as tolerated     02/02/2019  f/u ov/Sarah Miller re: doe ? Better p saba / maint on asmanex 100 Chief Complaint  Patient presents with  . Follow-up    review PFT.  pt states she is doing well, notes occasional PND.     Dyspnea: mailbox and back ok is 75 and sl[place] hill Cough: none / maint off gerd rx  Sleeping: on side , bed is flat, on cpap  SABA use: avg twice daily  02: none    No obvious day to day or daytime variability or assoc excess/ purulent sputum or mucus plugs or hemoptysis or cp or chest tightness, subjective wheeze or overt sinus or hb symptoms.   Sleeping  without nocturnal  or early am exacerbation  of respiratory  c/o's or need for noct saba. Also denies any obvious fluctuation of symptoms with weather or environmental changes or other  aggravating or alleviating factors except as outlined above   No unusual exposure hx or h/o childhood pna/ asthma or knowledge of premature birth.  Current Allergies, Complete Past Medical History, Past Surgical History, Family History, and Social History were reviewed in [employer] record.  ROS  The following are not active complaints unless bolded Hoarseness, sore throat, dysphagia, dental problems, itching, sneezing,  nasal congestion or discharge of excess mucus or purulent secretions, ear ache,   fever, chills, sweats, unintended wt loss or wt gain, classically pleuritic or exertional cp,  orthopnea pnd or arm/hand swelling  or leg swelling, presyncope, palpitations, abdominal pain, anorexia, nausea, vomiting, diarrhea  or change in bowel habits or change in bladder  habits, change in stools or change in urine, dysuria, hematuria,  rash, arthralgias, visual complaints, headache, numbness, weakness or ataxia or problems with walking or coordination,  change in mood or  memory.        Current Meds  Medication Sig  . albuterol (PROVENTIL HFA;VENTOLIN HFA) 108 (90 Base) MCG/ACT inhaler Inhale 2 puffs into the lungs every 6 (six) hours as needed for wheezing or shortness of breath.  Darlin Priestly HFA 100 MCG/ACT AERO TAKE 2 PUFFS BY MOUTH TWICE A DAY  . atorvastatin (LIPITOR) 10 MG tablet Take 10 mg by mouth daily.  . Azelastine-Fluticasone 137-50 MCG/ACT SUSP Place 1 puff into the nose 2 (two) times daily.  Marland Kitchen buPROPion (WELLBUTRIN XL) 300 MG 24 hr tablet Take 300 mg by mouth daily. Take with 150 mg to equal 450 mg  . fluticasone (FLONASE) 50 MCG/ACT nasal spray SPRAY 2 SPRAYS INTO EACH NOSTRIL EVERY DAY  . metFORMIN (GLUCOPHAGE-XR) 500 MG 24 hr tablet Take 2,000 mg by mouth every evening.   . montelukast (SINGULAIR) 10 MG tablet Take 1 tablet (10 mg total) by mouth at bedtime.          Past Medical History:  Diagnosis Date  . Allergic rhinitis   . Cancer [facility])    h/o melanoma '78 and 79  . Cervical arthritis    Dr Delman Kitten, S/P injection with good results.   . Depression    and anxiety  . Diabetes mellitus without complication ([place])   . Early menopause   . Frozen shoulder    H/O frozen shoulder and rotator cuff tendonititis, L s/p injection  . Sleep apnea   . Vitamin D deficiency        Objective:      obese pleasatn amb wf nad   Wt Readings from Last 3 Encounters:  02/02/19 227 lb 9.6 oz (103.2 kg)  05/02/18 206 lb (93.4 kg)  04/17/18 203 lb (92.1 kg)     Vital signs reviewed - Note on arrival 02 sats  96% on RA    HEENT : pt wearing mask not removed for exam due to covid -19 concerns.    NECK :  without JVD/Nodes/TM/ nl carotid upstrokes bilaterally   LUNGS: no acc muscle use,  Nl contour chest which is clear to A and P  bilaterally without cough on insp or exp maneuvers   CV:  RRR  no s3 or murmur or increase in P2, and no edema   ABD:  Mod obese but soft and nontender with nl inspiratory excursion in the supine position. No bruits or organomegaly appreciated, bowel sounds nl  MS:  Nl gait/ ext warm without deformities, calf tenderness, cyanosis or clubbing No obvious joint restrictions   SKIN: warm and dry without lesions  NEURO:  alert, approp, nl sensorium with  no motor or cerebellar deficits apparent.               Assessment

## 2019-02-03 ENCOUNTER — Encounter: Payer: Self-pay | Admitting: Internal Medicine

## 2019-02-03 DIAGNOSIS — R0609 Other forms of dyspnea: Secondary | ICD-10-CM | POA: Insufficient documentation

## 2019-02-03 DIAGNOSIS — R0600 Dyspnea, unspecified: Secondary | ICD-10-CM | POA: Insufficient documentation

## 2019-02-03 HISTORY — DX: Morbid (severe) obesity due to excess calories: E66.01

## 2019-02-03 HISTORY — DX: Other forms of dyspnea: R06.09

## 2019-02-03 NOTE — Assessment & Plan Note (Addendum)
I spent extra time with pt today reviewing appropriate use of albuterol for prn use on exertion with the following points: 1) saba is for relief of sob that does not improve by walking a slower pace or resting but rather if the pt does not improve after trying this first. 2) If the pt is convinced, as many are, that saba helps recover from activity faster then it's easy to tell if this is the case by re-challenging : ie stop, take the inhaler, then p 5 minutes try the exact same activity (intensity of workload) that just caused the symptoms and see if they are substantially diminished or not after saba 3) if there is an activity that reproducibly causes the symptoms, try the saba 15 min before the activity on alternate days   If in fact the saba really does help, then fine to continue to use it prn but advised may need to look closer at the maintenance regimen being used to achieve better control of airways disease with exertion.  (dulera 100 2bid)

## 2019-02-03 NOTE — Assessment & Plan Note (Signed)
PFTs 02/02/2019 with erv 10% c/w body habitus  Body mass index is 41.63 kg/m.  -  trending up No results found for: TSH   Contributing to gerd risk/ doe/reviewed the need and the process to achieve and maintain neg calorie balance > defer f/u primary care including intermittently monitoring thyroid status

## 2019-02-03 NOTE — Assessment & Plan Note (Addendum)
Onset mid sept 2019 - Allergy profile  02/28/2018    IgE  312  RAST neg    - 04/17/2018 d/c breo/ max rx for gerd/ cyclical cough  - PFTs [id number] unable to do spirometry, mod decreased lung vol with ERV 10% c/w obesity effects   Not clear whether or not these really is an asthmatic component here but cough is no longer a concern on present rx for allergy/asthma so will leave on same rx for now assuming she may have cough variant asthma but not an explanation for her reproducible doe (see separate a/p)

## 2019-02-23 ENCOUNTER — Encounter: Payer: Self-pay | Admitting: Acute Care

## 2019-03-20 ENCOUNTER — Other Ambulatory Visit: Payer: Self-pay | Admitting: Pulmonary Disease

## 2019-04-19 ENCOUNTER — Ambulatory Visit: Payer: Medicare Other | Attending: Internal Medicine

## 2019-04-19 DIAGNOSIS — Z23 Encounter for immunization: Secondary | ICD-10-CM | POA: Insufficient documentation

## 2019-04-19 NOTE — Progress Notes (Signed)
   Covid-19 Vaccination Clinic  Name:  Sarah Miller    MRN: [id number] DOB: 02-02-1946  04/19/2019  [name] was observed post Covid-19 immunization for 15 minutes without incidence. She was provided with Vaccine Information Sheet and instruction to access the V-Safe system.   [name] was instructed to call 911 with any severe reactions post vaccine: Marland Kitchen Difficulty breathing  . Swelling of your face and throat  . A fast heartbeat  . A bad rash all over your body  . Dizziness and weakness    Immunizations Administered    Name Date Dose VIS Date Route   Pfizer COVID-19 Vaccine 04/19/2019 10:29 AM 0.3 mL 02/06/2019 Intramuscular   Manufacturer: [place]   Lot: [id number]   [place]: [id number]

## 2019-04-29 ENCOUNTER — Other Ambulatory Visit: Payer: Self-pay | Admitting: Pulmonary Disease

## 2019-05-13 ENCOUNTER — Ambulatory Visit: Payer: Medicare Other | Attending: Internal Medicine

## 2019-05-13 DIAGNOSIS — Z23 Encounter for immunization: Secondary | ICD-10-CM

## 2019-05-13 NOTE — Progress Notes (Signed)
   Covid-19 Vaccination Clinic  Name:  SHAURI ELLINGHAM    MRN: [id number] DOB: 1946/02/02  05/13/2019  Ms. Romanoski was observed post Covid-19 immunization for 15 minutes without incident. She was provided with Vaccine Information Sheet and instruction to access the V-Safe system.   Ms. Jacquart was instructed to call 911 with any severe reactions post vaccine: Marland Kitchen Difficulty breathing  . Swelling of face and throat  . A fast heartbeat  . A bad rash all over body  . Dizziness and weakness   Immunizations Administered    Name Date Dose VIS Date Route   Pfizer COVID-19 Vaccine 05/13/2019 10:37 AM 0.3 mL 02/06/2019 Intramuscular   Manufacturer: [place]   Lot: [id number]   [place]: [id number]

## 2019-05-24 ENCOUNTER — Other Ambulatory Visit: Payer: Self-pay | Admitting: Primary Care

## 2019-07-23 ENCOUNTER — Other Ambulatory Visit: Payer: Self-pay | Admitting: Pulmonary Disease

## 2019-08-05 ENCOUNTER — Telehealth: Payer: Self-pay | Admitting: Internal Medicine

## 2019-08-05 ENCOUNTER — Other Ambulatory Visit: Payer: Self-pay

## 2019-08-05 ENCOUNTER — Encounter: Payer: Self-pay | Admitting: Internal Medicine

## 2019-08-05 ENCOUNTER — Ambulatory Visit (INDEPENDENT_AMBULATORY_CARE_PROVIDER_SITE_OTHER): Payer: Medicare Other | Admitting: Internal Medicine

## 2019-08-05 DIAGNOSIS — Z87891 Personal history of nicotine dependence: Secondary | ICD-10-CM

## 2019-08-05 DIAGNOSIS — R058 Other specified cough: Secondary | ICD-10-CM

## 2019-08-05 DIAGNOSIS — R05 Cough: Secondary | ICD-10-CM | POA: Diagnosis not present

## 2019-08-05 NOTE — Assessment & Plan Note (Signed)
Onset mid sept 2019 - Allergy profile  02/28/2018    IgE  312  RAST neg    - 04/17/2018 d/c breo/ max rx for gerd/ cyclical cough  - PFTs 18/06/5013 unable to do spirometry, mod decreased lung vol with ERV 10% c/w obesity effects - .08/05/2019  After extensive coaching inhaler device,  effectiveness =   75%    Still not clear whether any of her symptoms are asthma related   rec  Continue asmanex 100 2bid  I spent extra time with pt today reviewing appropriate use of albuterol for prn use on exertion with the following points: 1) saba is for relief of sob that does not improve by walking a slower pace or resting but rather if the pt does not improve after trying this first. 2) If the pt is convinced, as many are, that saba helps recover from activity faster then it's easy to tell if this is the case by re-challenging : ie stop, take the inhaler, then p 5 minutes try the exact same activity (intensity of workload) that just caused the symptoms and see if they are substantially diminished or not after saba 3) if there is an activity that reproducibly causes the symptoms, try the saba 15 min before the activity on alternate days   If in fact the saba really does help, then fine to continue to use it prn but advised may need to look closer at the maintenance regimen being used to achieve better control of airways disease with exertion.    If better able to do mb and back by using alb 15 min prior then rec change to laba/ics per her formulary

## 2019-08-05 NOTE — Telephone Encounter (Signed)
Sarah Miller this patient is one of the [facility] patients saw Dr. Melvyn Novas 6/9 and called in asking to schedule her CT. If you put in a new [facility] CT order I will get her scheduled

## 2019-08-05 NOTE — Assessment & Plan Note (Signed)
PFTs 02/02/2019 with erv 10% c/w body habitus  Body mass index is 45.23 kg/m.  -  trending up  No results found for: TSH   Clearly contributing to  doe/reviewed the need and the process to achieve and maintain neg calorie balance > defer f/u primary care including intermittently monitoring thyroid status           Each maintenance medication was reviewed in detail including emphasizing most importantly the difference between maintenance and prns and under what circumstances the prns are to be triggered using an action plan format where appropriate.  Total time for H and P, chart review, counseling, teaching device and generating customized AVS unique to this office visit / charting = 30 min

## 2019-08-05 NOTE — Patient Instructions (Addendum)
Try albuterol 15 min before an activity that you know would make you short of breath and see if it makes any difference and if makes none then don't take it after activity unless you can't catch your breath.      If you find that using albuterol (Ventolin) really helps you with exertion  (vs the same activity without the ventolin) call us for a prescription for Dulera  100 2 every 12 hours  ( or formulary preferred alternative like symbicort 80)   Please schedule a follow up office visit in 6 months, call sooner if needed

## 2019-08-05 NOTE — Telephone Encounter (Signed)
Sarah Miller looks like this is a cancer screening ct

## 2019-08-05 NOTE — Progress Notes (Signed)
Sarah Miller, female    DOB: 05/13/1945,   MRN: [id number]   Brief patient profile:  86 yowf quit smoking 02/2016 with pattern of violent sneezing going back to childhood and never had any lower resp problems but as an adult noted pattern of "bronchitis" ? Better p quit smoking but really sick in Sept 2019 > ER with midline cp better p rx omeprazole which seemed to help the cp but not the cough which persisted daily since = minimally productive more when lie down but present  24/7 and evolves sometimes into severe  choking spells  With w/u and Rx by Dr Ander Slade and sought second opinion so referred to me 04/17/2018.  Prev eval: rx Breo 200 variably reports "better right after take it because it helps me cough up stuff  ? How much?  = just a speck ? Better on prednisone but made sugars go up and losing wt with overt hyperglycemia and marked elevation of hgbA1c singulair addied 04/08/18     History of Present Illness  04/17/2018  Pulmonary/ 1st office eval/Carrieann Spielberg  Chief Complaint  Patient presents with  . Follow-up    Second opinion on cough per Dr Ander Slade. Pt c/o cough since September 2019. Cough is occ prod with clear to yellow sputum.   Dyspnea:  Minimally doe Unless coughing    Cough: minimally productive no worse in am/ cough to point of gag  Sleep: sleeps on side / cough some but no gagging nocturnally  SABA use: no better with saba  Takes ppi with bfast, using mints/ cough drops  REC Ompeprazole 40 mg Take 30- 60 min before your first and last meals of the day  Stop Breo  GERD  Finish your prednisone  Take delsym two tsp every 12 hours and supplement if needed with  tramadol 50 mg up to 1 every 4 hours to suppress the urge to cough.     05/02/2018 Adding Asmanex 100 - take 2 puffs twice daily until next follow-up  Adding Dymista (Fluticasone + Azelastine) nasal spray 1 puff per nostril twice daily - if too expensive ask pharmacy to help you get equivalent  Continue Omeprazole  twice daily for GERD symptoms, if EGD was normal and you do not have heart burn symptoms ok to stop Recommendations: gerd diet  - Take Delsym two tsp every 12 hours with tramadol 50 mg up to 1 every 4 hours to suppress the urge to cough - Once you have eliminated the cough for 3 straight days try reducing the tramadol first and then the delsym as tolerated     02/02/2019  f/u ov/Aviela Blundell re: doe ? Better p saba / maint on asmanex 100 Chief Complaint  Patient presents with  . Follow-up    review PFT.  pt states she is doing well, notes occasional PND.     Dyspnea: mailbox and back ok is 75 and slt hill Cough: none / maint off gerd rx  Sleeping: on side , bed is flat, on cpap  SABA use: avg twice daily  02: none  rec Try albuterol 15 min before an activity that you know would make you short of breath and see if it makes any difference and if makes none then don't take it after activity unless you can't catch your breath. If you find that using albuterol (Ventolin) really helps you with exertion  (vs the same activity without the ventolin) call us for a prescription for Dulera  100 2 every 12  hours     08/05/2019  f/u ov/Malu Pellegrini re:  ? Cough variant asthma vs uacs 2nd vaccine 05/13/19  Chief Complaint  Patient presents with  . Follow-up  Dyspnea:  Still doe x mailbox and slt uphill back top hours stops  one half way s checking sats and never prechallenges with saba as rec above  Cough: not much  Sleeping: on cpap and bed is flat  SABA use: you said to stop using  02: none    No obvious day to day or daytime variability or assoc excess/ purulent sputum or mucus plugs or hemoptysis or cp or chest tightness, subjective wheeze or overt sinus or hb symptoms.   Sleeping as above  without nocturnal  or early am exacerbation  of respiratory  c/o's or need for noct saba. Also denies any obvious fluctuation of symptoms with weather or environmental changes or other aggravating or alleviating factors  except as outlined above   No unusual exposure hx or h/o childhood pna/ asthma or knowledge of premature birth.  Current Allergies, Complete Past Medical History, Past Surgical History, Family History, and Social History were reviewed in [employer] record.  ROS  The following are not active complaints unless bolded Hoarseness, sore throat, dysphagia, dental problems, itching, sneezing,  nasal congestion or discharge of excess mucus or purulent secretions, ear ache,   fever, chills, sweats, unintended wt loss or wt gain, classically pleuritic or exertional cp,  orthopnea pnd or arm/hand swelling  or leg swelling, presyncope, palpitations, abdominal pain, anorexia, nausea, vomiting, diarrhea  or change in bowel habits or change in bladder habits, change in stools or change in urine, dysuria, hematuria,  rash, arthralgias, visual complaints, headache, numbness, weakness or ataxia or problems with walking or coordination,  change in mood or  memory.        Current Meds  Medication Sig  . albuterol (PROVENTIL HFA;VENTOLIN HFA) 108 (90 Base) MCG/ACT inhaler Inhale 2 puffs into the lungs every 6 (six) hours as needed for wheezing or shortness of breath.  Darlin Priestly HFA 100 MCG/ACT AERO TAKE 2 PUFFS BY MOUTH TWICE A DAY  . atorvastatin (LIPITOR) 10 MG tablet Take 10 mg by mouth daily.  . Azelastine-Fluticasone 137-50 MCG/ACT SUSP Place 1 puff into the nose 2 (two) times daily.  Marland Kitchen buPROPion (WELLBUTRIN XL) 300 MG 24 hr tablet Take 300 mg by mouth daily. Take with 150 mg to equal 450 mg  . fluticasone (FLONASE) 50 MCG/ACT nasal spray SPRAY 2 SPRAYS INTO EACH NOSTRIL EVERY DAY  . metFORMIN (GLUCOPHAGE-XR) 500 MG 24 hr tablet Take 2,000 mg by mouth every evening.   . montelukast (SINGULAIR) 10 MG tablet TAKE 1 TABLET BY MOUTH EVERYDAY AT BEDTIME            Past Medical History:  Diagnosis Date  . Allergic rhinitis   . Cancer [facility])    h/o melanoma '78 and 79  . Cervical  arthritis    Dr Delman Kitten, S/P injection with good results.   . Depression    and anxiety  . Diabetes mellitus without complication ([place])   . Early menopause   . Frozen shoulder    H/O frozen shoulder and rotator cuff tendonititis, L s/p injection  . Sleep apnea   . Vitamin D deficiency        Objective:      obese pleasant amb wf nad    08/05/2019        239  02/02/19 227 lb  9.6 oz (103.2 kg)  05/02/18 206 lb (93.4 kg)  04/17/18 203 lb (92.1 kg)     Vital signs reviewed  08/05/2019  - Note at rest 02 sats  97% on RA   HEENT : pt wearing mask not removed for exam due to covid -19 concerns.    NECK :  without JVD/Nodes/TM/ nl carotid upstrokes bilaterally   LUNGS: no acc muscle use,  Nl contour chest which is clear to A and P bilaterally without cough on insp or exp maneuvers   CV:  RRR  no s3 or murmur or increase in P2, and no edema   ABD:  Obese/ soft and nontender with nl inspiratory excursion in the supine position. No bruits or organomegaly appreciated, bowel sounds nl  MS:  Nl gait/ ext warm without deformities, calf tenderness, cyanosis or clubbing No obvious joint restrictions   SKIN: warm and dry without lesions    NEURO:  alert, approp, nl sensorium with  no motor or cerebellar deficits apparent.               Assessment

## 2019-08-06 ENCOUNTER — Ambulatory Visit: Payer: Medicare Other | Admitting: Internal Medicine

## 2019-08-06 NOTE — Telephone Encounter (Signed)
I have Sarah Miller's Ct scheduled on 08/25/2019 @ 10;00am at [facility] and she is aware of the appt and location

## 2019-08-06 NOTE — Telephone Encounter (Signed)
New chest CT order placed.

## 2019-08-25 ENCOUNTER — Ambulatory Visit
Admission: RE | Admit: 2019-08-25 | Discharge: 2019-08-25 | Disposition: A | Discharge status: Home / Self Care | Payer: PRIVATE HEALTH INSURANCE | Source: Ambulatory Visit | Attending: Acute Care | Admitting: Acute Care

## 2019-08-25 DIAGNOSIS — Z87891 Personal history of nicotine dependence: Secondary | ICD-10-CM

## 2019-08-27 NOTE — Progress Notes (Signed)
Please call patient and let them  know their  low dose Ct was read as a Lung RADS 2: nodules that are benign in appearance and behavior with a very low likelihood of becoming a clinically active cancer due to size or lack of growth. Recommendation per radiology is for a repeat LDCT in 12 months. .Please let them  know we will order and schedule their  annual screening scan for 07/2020. Please let them  know there was notation of CAD on their  scan.  Please remind the patient  that this is a non-gated exam therefore degree or severity of disease  cannot be determined. Please have them  follow up with their PCP regarding potential risk factor modification, dietary therapy or pharmacologic therapy if clinically indicated. Pt.  is  currently on statin therapy. Please place order for annual  screening scan for 07/2020 and fax results to PCP. Thanks so much. 

## 2019-09-01 ENCOUNTER — Other Ambulatory Visit: Payer: Self-pay | Admitting: *Deleted

## 2019-09-01 DIAGNOSIS — Z87891 Personal history of nicotine dependence: Secondary | ICD-10-CM

## 2019-09-17 ENCOUNTER — Encounter: Payer: Self-pay | Admitting: General Practice

## 2019-09-20 ENCOUNTER — Other Ambulatory Visit: Payer: Self-pay | Admitting: Internal Medicine

## 2019-10-09 DIAGNOSIS — F3341 Major depressive disorder, recurrent, in partial remission: Secondary | ICD-10-CM | POA: Diagnosis not present

## 2019-10-09 DIAGNOSIS — E1169 Type 2 diabetes mellitus with other specified complication: Secondary | ICD-10-CM | POA: Diagnosis not present

## 2019-10-09 DIAGNOSIS — R21 Rash and other nonspecific skin eruption: Secondary | ICD-10-CM | POA: Diagnosis not present

## 2019-10-09 DIAGNOSIS — J439 Emphysema, unspecified: Secondary | ICD-10-CM | POA: Diagnosis not present

## 2019-10-09 DIAGNOSIS — B079 Viral wart, unspecified: Secondary | ICD-10-CM | POA: Diagnosis not present

## 2019-10-09 DIAGNOSIS — M545 Low back pain: Secondary | ICD-10-CM | POA: Diagnosis not present

## 2019-10-16 DIAGNOSIS — D72829 Elevated white blood cell count, unspecified: Secondary | ICD-10-CM | POA: Diagnosis not present

## 2019-10-23 ENCOUNTER — Other Ambulatory Visit: Payer: Self-pay

## 2019-10-23 ENCOUNTER — Ambulatory Visit: Payer: Medicare Other | Attending: Family Medicine | Admitting: Physical Therapy

## 2019-10-23 ENCOUNTER — Encounter: Payer: Self-pay | Admitting: Physical Therapy

## 2019-10-23 DIAGNOSIS — M545 Low back pain, unspecified: Secondary | ICD-10-CM

## 2019-10-23 DIAGNOSIS — M6281 Muscle weakness (generalized): Secondary | ICD-10-CM | POA: Insufficient documentation

## 2019-10-23 NOTE — Patient Instructions (Signed)
Access Code: [email or web address]: [email or web address]: 08/27/2021Prepared by: Anderson Malta PaaExercises  Seated Hamstring Stretch - 2 x daily - 7 x weekly - 1 sets - 3 reps - 30 hold  Standing Gastroc Stretch - 2 x daily - 7 x weekly - 1 sets - 3 reps - 30 hold  Supine Lower Trunk Rotation - 2 x daily - 7 x weekly - 2 sets - 10 reps - 10 hold  Sit to Stand with Counter Support - 2 x daily - 7 x weekly - 2 sets - 10 reps

## 2019-10-24 NOTE — Therapy (Addendum)
[place], [place], [address] Phone: [phone]   Fax:  [phone]  Physical Therapy Evaluation/Discharge  Patient Details  Name: Sarah Miller MRN: [id number] Date of Birth: 02-10-1946 Referring Provider (PT): Dr. Harlan Stains   Encounter Date: 10/23/2019   PT End of Session - 10/24/19 0754    Visit Number 1    Number of Visits 12    Date for PT Re-Evaluation 12/04/19    Authorization Type Medicare    PT Start Time 1130    PT Stop Time 1215    PT Time Calculation (min) 45 min    Activity Tolerance Patient tolerated treatment well    Behavior During Therapy [facility] for tasks assessed/performed           Past Medical History:  Diagnosis Date  . Allergic rhinitis   . Cancer [facility])    h/o melanoma '78 and 79  . Cervical arthritis    Dr Delman Kitten, S/P injection with good results.   . Depression    and anxiety  . Diabetes mellitus without complication ([place])   . Early menopause   . Frozen shoulder    H/O frozen shoulder and rotator cuff tendonititis, L s/p injection  . Sleep apnea   . Vitamin D deficiency     Past Surgical History:  Procedure Laterality Date  . ABDOMINAL HYSTERECTOMY    . FRACTURE SURGERY Left    wrist  . MELANOMA EXCISION      There were no vitals filed for this visit.    Subjective Assessment - 10/23/19 1142    Subjective Patient here with low back pain bilateral which began back in April-May 2021.  She has difficulty standing especially if she standing still.  She reports min radiation to back of legs R>L, hips. She denies weakness or sensory disturbance.  She reports her balance is off, she fell 1 time and had a hard time getting up.    Pertinent History knee pain, chronic back pain . Nov. 2020 spasms in back, gradually went away.    Limitations Sitting;Standing;Walking;Other (comment);House hold activities    Diagnostic tests XR not available    Patient Stated  Goals Pain relief    Currently in Pain? Yes    Pain Score 5     Pain Location Back    Pain Orientation Right;Left    Pain Descriptors / Indicators Burning;Aching;Sore;Tightness    Pain Type Acute pain    Pain Onset More than a month ago    Pain Frequency Intermittent    Aggravating Factors  standing    Pain Relieving Factors changing positions, laying down,Motrin, has some other meds    Effect of Pain on Daily Activities limits housework, not comfortable    Multiple Pain Sites No              [facility] PT Assessment - 10/24/19 0001      Assessment   Medical Diagnosis acute low back pain     Referring Provider (PT) Dr. Harlan Stains    Onset Date/Surgical Date --   3 mos, acute on chronic   Next MD Visit unknown    Prior Therapy no       Precautions   Precautions None      Restrictions   Weight Bearing Restrictions No      Balance Screen   Has the patient fallen in the past 6 months Yes    How many times? 1  Has the patient had a decrease in activity level because of a fear of falling?  Yes    Is the patient reluctant to leave their home because of a fear of falling?  No      Home Environment   Living Environment Private residence    Living Arrangements Alone    Type of [place] One level    [place] - 4 wheels      Prior Function   Level of Independence Independent with basic ADLs;Independent with household mobility without device;Independent with community mobility without device    Vocation Retired    TEFL teacher     Leisure social life limited, cats, family, sedentary      Cognition   Overall Cognitive Status Within Functional Limits for tasks assessed      Observation/Other Assessments   Focus on Therapeutic Outcomes (FOTO)  62%      Sensation   Light Touch Appears Intact      Coordination   Gross Motor Movements are Fluid and Coordinated Not tested      Squat   Comments able to grab purse  from the ground using 1 hand on thigh for support       Single Leg Stance   Comments limited x 5 sec       Sit to Stand   Comments x 25. 75 sec      Posture/Postural Control   Posture/Postural Control Postural limitations    Postural Limitations Rounded Shoulders;Forward head;Increased thoracic kyphosis      AROM   Lumbar Flexion WFL     Lumbar Extension limited by 15-20%     Lumbar - Right Side Bend pain on R side , min tightness     Lumbar - Left Side Bend min tightness     Lumbar - Right Rotation WFL     Lumbar - Left Rotation [facility]       Strength   Right Hip Flexion 3+/5    Right Hip ABduction 4/5    Left Hip Flexion 3+/5    Left Hip ABduction 3+/5    Right Knee Flexion 4/5    Right Knee Extension 4+/5    Left Knee Flexion 4/5    Left Knee Extension 4+/5      Palpation   Palpation comment pain mostly surrounding L greater trochanter, anterolateral hip.  Min tenderness in lumbar parapsinals bilateral       Special Tests    Special Tests Lumbar    Lumbar Tests Slump Test;Straight Leg Raise      Slump test   Findings Negative      Straight Leg Raise   Findings Negative      Bed Mobility   Bed Mobility Supine to Sit;Sit to Supine    Supine to Sit Minimal Assistance - Patient > 75%    Sit to Supine Supervision/Verbal cueing      Transfers   Five time sit to stand comments  25.75sec       Ambulation/Gait   Gait Comments NT                      Objective measurements completed on examination: See above findings.               PT Education - 10/24/19 0754    Education Details PT/POC, HEP, symptom mgmt    Person(s) Educated Patient    Methods Explanation;Handout  Comprehension Verbalized understanding;Returned demonstration               PT Long Term Goals - 10/24/19 0756      PT LONG TERM GOAL #1   Title Pt will be I with HEP for flexibility and strength    Time 6    Period Weeks    Status New    Target Date 12/04/19        PT LONG TERM GOAL #2   Title Pt will be able to stand to perform light housework with min  pain in low back    Baseline has increased pain and difficulty light work < 10 min    Time 6    Period Weeks    Status New    Target Date 12/04/19      PT LONG TERM GOAL #3   Title Pt will be abele to lift items up to 10 lbs from the floor to waist height with proper mechanics to preserve spine.    Time 6    Period Weeks    Status New    Target Date 12/04/19      PT LONG TERM GOAL #4   Title Pt will be able to score 15% better on FOTO to demo improved functional mobility    Baseline 62% impaired    Time 6    Period Weeks    Status New    Target Date 12/04/19      PT LONG TERM GOAL #5   Title Pt will be able to improve balance and mobility score based on initial (TUG, Berg, etc) to reduce fall risk    Baseline NT on eval    Time 6    Period Weeks    Status New    Target Date 12/04/19                  Plan - 10/24/19 0804    Clinical Impression Statement Patient presents for mod complexity eval of low back pain which has changed in presetnation.  She initally has severe spasms (Nov 2020) and that resolved, she is currently experiencing more of a mechanical low back pain with limiations in all aspects of mobility in her home.  She presents with core and hip weakness, tight hamstrings and physicla deconditioning. She should improve with PT and increasing overall activity.    Personal Factors and Comorbidities Past/Current Experience;Fitness;Comorbidity 3+    Comorbidities diabetes, depression, obesity    Examination-Activity Limitations Bed Mobility;Locomotion Level;Stand;Stairs;Lift;Squat;Carry;Transfers;Bend;Dressing;Sleep    Examination-Participation Restrictions Community Activity;Interpersonal Relationship;Shop;Cleaning;Laundry    Stability/Clinical Decision Making Evolving/Moderate complexity    Clinical Decision Making Moderate    Rehab Potential Good    PT Frequency  2x / week    PT Duration 6 weeks    PT Treatment/Interventions ADLs/Self Care Home Management;Electrical Stimulation;Therapeutic activities;Patient/family education;Taping;Therapeutic exercise;Aquatic Therapy;Cryotherapy;Ultrasound;Moist Heat;Balance training;Passive range of motion;Manual techniques;Dry needling;Functional mobility training    PT Next Visit Plan check HEP , general strenghening    PT Home Exercise Plan seated hamstrgin, calf stretch, LTR and sit to stand    Consulted and Agree with Plan of Care Patient           Patient will benefit from skilled therapeutic intervention in order to improve the following deficits and impairments:  Increased fascial restricitons, Improper body mechanics, Pain, Postural dysfunction, Increased muscle spasms, Decreased mobility, Decreased activity tolerance, Decreased endurance, Decreased range of motion, Decreased strength, Impaired UE functional use, Obesity, Impaired flexibility, Difficulty walking, Decreased balance  Visit Diagnosis: Acute midline low back pain, unspecified whether sciatica present  Muscle weakness (generalized)     Problem List Patient Active Problem List   Diagnosis Date Noted  . DOE (dyspnea on exertion) 02/03/2019  . Morbid obesity due to excess calories ([place]) 02/03/2019  . Upper airway cough syndrome vs cough variant asthma 04/18/2018  . RBBB 03/19/2013    Brayden Brodhead 10/24/2019, 8:10 AM  [facility] [address] [place], [place], [address] Phone: [phone]   Fax:  [phone]  Name: Sarah Miller MRN: [id number] Date of Birth: [birth year]   Raeford Razor, PT 10/24/19 8:11 AM Phone: [phone] Fax: [phone]  PHYSICAL THERAPY DISCHARGE SUMMARY  Visits from Start of Care: 1  Current functional level related to goals / functional outcomes: unknown   Remaining deficits: Unknown   Education / Equipment: See above   Plan: Patient  agrees to discharge.  Patient goals were not met.    Patient is being discharged due to not returning since the last visit.  ?????    Raeford Razor, PT 12/14/19 3:15 PM Phone: [phone] Fax: [phone]

## 2019-11-02 ENCOUNTER — Other Ambulatory Visit: Payer: Self-pay | Admitting: Pulmonary Disease

## 2019-11-03 DIAGNOSIS — E119 Type 2 diabetes mellitus without complications: Secondary | ICD-10-CM | POA: Diagnosis not present

## 2019-11-12 ENCOUNTER — Ambulatory Visit: Payer: Medicare Other | Admitting: Cardiology

## 2019-11-16 ENCOUNTER — Ambulatory Visit: Payer: Medicare Other | Admitting: Physical Therapy

## 2019-11-18 ENCOUNTER — Encounter: Payer: Medicare Other | Admitting: Physical Therapy

## 2019-11-20 DIAGNOSIS — B079 Viral wart, unspecified: Secondary | ICD-10-CM | POA: Diagnosis not present

## 2019-11-20 DIAGNOSIS — F419 Anxiety disorder, unspecified: Secondary | ICD-10-CM | POA: Diagnosis not present

## 2019-11-20 DIAGNOSIS — Z23 Encounter for immunization: Secondary | ICD-10-CM | POA: Diagnosis not present

## 2019-11-20 DIAGNOSIS — F3341 Major depressive disorder, recurrent, in partial remission: Secondary | ICD-10-CM | POA: Diagnosis not present

## 2019-11-27 ENCOUNTER — Ambulatory Visit: Payer: Medicare Other | Admitting: Physical Therapy

## 2019-11-30 ENCOUNTER — Other Ambulatory Visit: Payer: Self-pay

## 2019-11-30 ENCOUNTER — Encounter: Payer: Self-pay | Admitting: Cardiology

## 2019-11-30 ENCOUNTER — Ambulatory Visit (INDEPENDENT_AMBULATORY_CARE_PROVIDER_SITE_OTHER): Payer: Medicare Other | Admitting: Cardiology

## 2019-11-30 VITALS — BP 138/80 | HR 89 | Ht 61.0 in | Wt 238.2 lb

## 2019-11-30 DIAGNOSIS — E119 Type 2 diabetes mellitus without complications: Secondary | ICD-10-CM | POA: Diagnosis not present

## 2019-11-30 DIAGNOSIS — I700 Atherosclerosis of aorta: Secondary | ICD-10-CM

## 2019-11-30 DIAGNOSIS — E7800 Pure hypercholesterolemia, unspecified: Secondary | ICD-10-CM | POA: Diagnosis not present

## 2019-11-30 DIAGNOSIS — I2510 Atherosclerotic heart disease of native coronary artery without angina pectoris: Secondary | ICD-10-CM | POA: Diagnosis not present

## 2019-11-30 DIAGNOSIS — J449 Chronic obstructive pulmonary disease, unspecified: Secondary | ICD-10-CM | POA: Insufficient documentation

## 2019-11-30 NOTE — Addendum Note (Signed)
Addended by: Antonieta Iba on: 11/30/2019 01:26 PM   Modules accepted: Orders

## 2019-11-30 NOTE — Patient Instructions (Signed)
Medication Instructions:  Your physician recommends that you continue on your current medications as directed. Please refer to the Current Medication list given to you today.  *If you need a refill on your cardiac medications before your next appointment, please call your pharmacy*  Testing/Procedures: Your physician has requested that you have a lexiscan myoview. For further information please visit [email or web address]. Please follow instruction sheet, as given.  Your physician has requested that your have a calcium score CT scan.   Follow-Up: At [facility], you and your health needs are our priority.  As part of our continuing mission to provide you with exceptional heart care, we have created designated Provider Care Teams.  These Care Teams include your primary Cardiologist (physician) and Advanced Practice Providers (APPs -  Physician Assistants and Nurse Practitioners) who all work together to provide you with the care you need, when you need it.  Your next appointment:   1 year(s)  The format for your next appointment:   In Person  Provider:   You may see Fransico Him, MD or one of the following Advanced Practice Providers on your designated Care Team:    Melina Copa, PA-C  Ermalinda Barrios, PA-C

## 2019-11-30 NOTE — Progress Notes (Signed)
Cardiology Consult Note    Date:  11/30/2019   ID:  Skiler, Tye Dec 07, 1945, MRN [id number]  PCP:  Harlan Stains, MD  Cardiologist:  Fransico Him, MD   Chief Complaint  Patient presents with  . New Patient (Initial Visit)    Coronary artery calcifications on Chest CT    History of Present Illness:  Sarah Miller is a 74 y.o. female who is being seen today for the evaluation of coronary artery calcifications on Chest CTA at the request of Harlan Stains, MD.  This is a 74yo female with a hx of depression and anxiety, DM ,early menopause and OSA.  She was noted to have coronary artery calcifications on screening Chest CT for lung Ca.  CT showed coronary artery calcifications in all 3 epicardial vessels and aortic atherosclerosis.  She is now here for further evaluation.  She is here today for followup and is doing well.  She denies any chest pain or pressure, PND, orthopnea, LE edema, dizziness, palpitations or syncope. She does have problems with balance.  She has chronic DOE from COPD which is stable.  She has a remote hx of tobacco use and fm hx of premature CAD. She is compliant with her meds and is tolerating meds with no SE.    Past Medical History:  Diagnosis Date  . Allergic rhinitis   . Aortic atherosclerosis ([place])   . Cancer [facility])    h/o melanoma '78 and 79  . Cervical arthritis    Dr Delman Kitten, S/P injection with good results.   Marland Kitchen COPD (chronic obstructive pulmonary disease) ([place])    with emphysema  . Coronary artery calcification seen on CAT scan   . Depression    and anxiety  . Diabetes mellitus without complication ([place])   . Early menopause   . Frozen shoulder    H/O frozen shoulder and rotator cuff tendonititis, L s/p injection  . Sleep apnea   . Vitamin D deficiency     Past Surgical History:  Procedure Laterality Date  . ABDOMINAL HYSTERECTOMY    . FRACTURE SURGERY Left    wrist  . MELANOMA EXCISION      Current Medications: Current  Meds  Medication Sig  . ARNUITY ELLIPTA 200 MCG/ACT AEPB Inhale 1 puff into the lungs daily.  Darlin Priestly HFA 100 MCG/ACT AERO TAKE 2 PUFFS BY MOUTH TWICE A DAY  . atorvastatin (LIPITOR) 10 MG tablet Take 10 mg by mouth daily.  . Azelastine-Fluticasone 137-50 MCG/ACT SUSP Place 1 puff into the nose 2 (two) times daily.  Marland Kitchen buPROPion (WELLBUTRIN XL) 300 MG 24 hr tablet Take 300 mg by mouth daily. Take with 150 mg to equal 450 mg  . desoximetasone (TOPICORT) 0.25 % cream Apply 1 application topically 2 (two) times daily.  . fluticasone (FLONASE) 50 MCG/ACT nasal spray SPRAY 2 SPRAYS INTO EACH NOSTRIL EVERY DAY  . hydrOXYzine (ATARAX/VISTARIL) 25 MG tablet Take 25 mg by mouth daily.  . Insulin Glargine (BASAGLAR KWIKPEN) 100 UNIT/ML Inject 45 Units into the skin daily.  Marland Kitchen loratadine (CLARITIN) 10 MG tablet Take 10 mg by mouth daily.  . metFORMIN (GLUCOPHAGE-XR) 500 MG 24 hr tablet Take 1,000 mg by mouth as directed. 500 mg in morning and 500 mg at night  . montelukast (SINGULAIR) 10 MG tablet TAKE 1 TABLET BY MOUTH EVERYDAY AT BEDTIME  . nystatin (MYCOSTATIN/NYSTOP) powder Apply 1 application topically 2 (two) times daily as needed.  . sertraline (ZOLOFT) 50 MG tablet Take  50 mg by mouth daily.  Marland Kitchen triamcinolone cream (KENALOG) 0.5 % Apply 1 application topically as directed.    Allergies:   Other and Penicillins   Social History   Socioeconomic History  . Marital status: Divorced    Spouse name: Not on file  . Number of children: Not on file  . Years of education: Not on file  . Highest education level: Not on file  Occupational History  . Not on file  Tobacco Use  . Smoking status: Former Smoker    Packs/day: 0.75    Years: 53.00    Pack years: 39.75    Types: Cigarettes    Quit date: 02/27/2016    Years since quitting: 3.7  . Smokeless tobacco: Never Used  Substance and Sexual Activity  . Alcohol use: Yes    Comment: rare  . Drug use: No  . Sexual activity: Not on file  Other  Topics Concern  . Not on file  Social History Narrative  . Not on file   Social Determinants of Health   Financial Resource Strain:   . Difficulty of Paying Living Expenses: Not on file  Food Insecurity:   . Worried About Charity fundraiser in the Last Year: Not on file  . Ran Out of Food in the Last Year: Not on file  Transportation Needs:   . Lack of Transportation (Medical): Not on file  . Lack of Transportation (Non-Medical): Not on file  Physical Activity:   . Days of Exercise per Week: Not on file  . Minutes of Exercise per Session: Not on file  Stress:   . Feeling of Stress : Not on file  Social Connections:   . Frequency of Communication with Friends and Family: Not on file  . Frequency of Social Gatherings with Friends and Family: Not on file  . Attends Religious Services: Not on file  . Active Member of Clubs or Organizations: Not on file  . Attends Archivist Meetings: Not on file  . Marital Status: Not on file     Family History:  The patient's family history includes CAD in her brother, father, and paternal grandfather; Cancer in her father, mother, and another family member; Diabetes in her father; Heart attack in her father; Heart attack (age of onset: 31) in her brother; Heart disease in her brother, father, and another family member; Hyperlipidemia in her father and another family member; Hypertension in her father and sister.   ROS:   Please see the history of present illness.    ROS All other systems reviewed and are negative.  No flowsheet data found.     PHYSICAL EXAM:   VS:  BP 138/80   Pulse 89   Ht 5\' 1"  (1.549 m)   Wt 238 lb 3.2 oz (108 kg)   BMI 45.01 kg/m    GEN: Well nourished, well developed, in no acute distress  HEENT: normal  Neck: no JVD, carotid bruits, or masses Cardiac: RRR; no murmurs, rubs, or gallops,no edema.  Intact distal pulses bilaterally.  Respiratory:  clear to auscultation bilaterally, normal work of  breathing GI: soft, nontender, nondistended, + BS MS: no deformity or atrophy  Skin: warm and dry, no rash Neuro:  Alert and Oriented x 3, Strength and sensation are intact Psych: euthymic mood, full affect  Wt Readings from Last 3 Encounters:  11/30/19 238 lb 3.2 oz (108 kg)  08/05/19 239 lb 6.4 oz (108.6 kg)  02/02/19 227 lb 9.6 oz (  103.2 kg)      Studies/Labs Reviewed:   EKG:  EKG is ordered today.  The ekg ordered today demonstrates NSR with RBBB  Recent Labs: No results found for requested labs within last 8760 hours.   Lipid Panel No results found for: CHOL, TRIG, HDL, CHOLHDL, VLDL, LDLCALC, LDLDIRECT  Additional studies/ records that were reviewed today include:  OV notes from PCP, EKG, chest CT    ASSESSMENT:    1. Coronary artery calcification seen on CAT scan   2. DM type 2, goal HbA1c < 7% (HCC)   3. Aortic atherosclerosis ([place])   4. Pure hypercholesterolemia      PLAN:  In order of problems listed above:  1.  Coronary artery calcifications -noted on chest CT -she has a chronic RBBB that is old -will get a coronary Ca score to assess future risk and Lexiscan myoview to rule out ischemia -she has CRFs including early menopause, DM, remote hx of tobacco and fm hx of CAD -she has not had any anginal sx.  2.  DM -followed by PCP  3.  Aortic atherosclerosis -she is on statin therapy -BP well controlled  4.  HLD -LDL goal < 70 -LDL was 49 in Aug 2021 -continue Atorvastatin 10mg  daily     Medication Adjustments/Labs and Tests Ordered: Current medicines are reviewed at length with the patient today.  Concerns regarding medicines are outlined above.  Medication changes, Labs and Tests ordered today are listed in the Patient Instructions below.  There are no Patient Instructions on file for this visit.   Signed, Fransico Him, MD  11/30/2019 1:21 PM    [facility] [address], [place], [place]  [address] Phone: [phone]; Fax: [phone]

## 2019-12-01 ENCOUNTER — Ambulatory Visit: Payer: Medicare Other | Admitting: Physical Therapy

## 2019-12-04 DIAGNOSIS — Z23 Encounter for immunization: Secondary | ICD-10-CM | POA: Diagnosis not present

## 2019-12-11 DIAGNOSIS — B079 Viral wart, unspecified: Secondary | ICD-10-CM | POA: Diagnosis not present

## 2019-12-22 ENCOUNTER — Telehealth (HOSPITAL_COMMUNITY): Payer: Self-pay | Admitting: Cardiology

## 2019-12-22 DIAGNOSIS — S42295A Other nondisplaced fracture of upper end of left humerus, initial encounter for closed fracture: Secondary | ICD-10-CM | POA: Diagnosis not present

## 2019-12-22 DIAGNOSIS — S4992XA Unspecified injury of left shoulder and upper arm, initial encounter: Secondary | ICD-10-CM | POA: Diagnosis not present

## 2019-12-22 DIAGNOSIS — W19XXXA Unspecified fall, initial encounter: Secondary | ICD-10-CM | POA: Diagnosis not present

## 2019-12-22 DIAGNOSIS — S0512XA Contusion of eyeball and orbital tissues, left eye, initial encounter: Secondary | ICD-10-CM | POA: Diagnosis not present

## 2019-12-22 NOTE — Telephone Encounter (Signed)
Patient called and cancelled Myocardial Stress test due to she had a bad fall and she will call back at a later date to reschedule. Order will be removed from the [place] and when patient calls back we can reinstate the order. Thank you.

## 2019-12-23 ENCOUNTER — Ambulatory Visit
Admission: RE | Admit: 2019-12-23 | Discharge: 2019-12-23 | Disposition: A | Discharge status: Home / Self Care | Payer: Medicare Other | Source: Ambulatory Visit | Attending: Orthopaedic Surgery | Admitting: Orthopaedic Surgery

## 2019-12-23 ENCOUNTER — Other Ambulatory Visit: Payer: Self-pay

## 2019-12-23 ENCOUNTER — Other Ambulatory Visit: Payer: Self-pay | Admitting: Orthopaedic Surgery

## 2019-12-23 DIAGNOSIS — S42295A Other nondisplaced fracture of upper end of left humerus, initial encounter for closed fracture: Secondary | ICD-10-CM | POA: Diagnosis not present

## 2019-12-23 DIAGNOSIS — M25512 Pain in left shoulder: Secondary | ICD-10-CM

## 2019-12-23 DIAGNOSIS — S42215A Unspecified nondisplaced fracture of surgical neck of left humerus, initial encounter for closed fracture: Secondary | ICD-10-CM | POA: Diagnosis not present

## 2019-12-23 DIAGNOSIS — S42255A Nondisplaced fracture of greater tuberosity of left humerus, initial encounter for closed fracture: Secondary | ICD-10-CM | POA: Diagnosis not present

## 2019-12-23 DIAGNOSIS — M898X2 Other specified disorders of bone, upper arm: Secondary | ICD-10-CM

## 2019-12-24 ENCOUNTER — Telehealth: Payer: Self-pay | Admitting: *Deleted

## 2019-12-24 DIAGNOSIS — S42295D Other nondisplaced fracture of upper end of left humerus, subsequent encounter for fracture with routine healing: Secondary | ICD-10-CM | POA: Diagnosis not present

## 2019-12-24 NOTE — Telephone Encounter (Signed)
   [facility] Pre-operative Risk Assessment    HEARTCARE STAFF: - Please ensure there is not already an duplicate clearance open for this procedure. - Under Visit Info/Reason for Call, type in Other and utilize the format Clearance MM/DD/YY or Clearance TBD. Do not use dashes or single digits. - If request is for dental extraction, please clarify the # of teeth to be extracted.  Request for surgical clearance: URGENT PER REQUEST  1. What type of surgery is being performed? LEFT TOTAL SHOULDER REPLACEMENT   2. When is this surgery scheduled? 12/28/19   3. What type of clearance is required (medical clearance vs. Pharmacy clearance to hold med vs. Both)? MEDICAL  4. Are there any medications that need to be held prior to surgery and how long? NO MEDS LISTED  5. Practice name and name of physician performing surgery? [facility]; DR. DAX VARKEY   6. What is the office phone number? [phone]   7.   What is the office fax number? [place].   Anesthesia type (None, local, MAC, general) ? CHOICE   Julaine Hua 12/24/2019, 4:49 PM  _________________________________________________________________   (provider comments below)

## 2019-12-24 NOTE — Progress Notes (Signed)
Please enter orders for PAT visit 12-25-19

## 2019-12-24 NOTE — Progress Notes (Addendum)
COVID Vaccine Completed:  x2 Date COVID Vaccine completed:  04-19-19 & 05-13-19 COVID vaccine manufacturer: [place]   PCP - Harlan Stains, MD Cardiologist - Fransico Him, MD.  Last OV 11-30-19  Chest x-ray - CT chest 08-25-19 in Epic EKG - 11-30-19 in Epic Stress Test - 04-20-13 in Epic.  Pt states that she was supposed to have stress test 12/25/19 but needs emergency shoulder surgery.  Not having symptoms, just recheck per pt. ECHO - 04-17-13 in Epic Cardiac Cath -  Pacemaker/ICD device last checked: Cardiac CT [facility]  Sleep Study - 3 years ago.   CPAP -  Yes settting 4  Fasting Blood Sugar - 110-130s Checks Blood Sugar - once a day  Blood Thinner Instructions: Aspirin Instructions:  ASA 81 mg  Last Dose:  Anesthesia review: RBBB, aortic atherosclerosis, COPD, coronary artery calcifications.  OSA  Patient denies shortness of breath, fever, cough and chest pain at PAT appointment   Patient verbalized understanding of instructions that were given to them at the PAT appointment. Patient was also instructed that they will need to review over the PAT instructions again at home before surgery.

## 2019-12-24 NOTE — Telephone Encounter (Signed)
   Primary Cardiologist: Fransico Him, MD  Chart reviewed as part of pre-operative protocol coverage.  Dr. Radford Pax, you saw this patient 11/30/19 for the evaluation of coronary artery calcifications on CT scan in a patient with DM, HLD, tobacco abuse, and family history of premature CAD. You recommended a calcium score and NST at that time - neither of these studies have been preformed yet.   Today we received an urgent request for preop evaluation for shoulder replacement tentatively scheduled for 12/28/19. It sounds like she was asymptomatic from a cardiac standpoint at your visit. Hoping you can weigh in on whether these tests need to be completed prior to her surgery (almost certainly resulting in a delay of her surgery) or whether she can she proceed.   Please route your response back to P CV DIV PREOP. Thank you!  Abigail Butts, PA-C 12/24/2019, 5:28 PM

## 2019-12-24 NOTE — Telephone Encounter (Signed)
She needs a coronary CTA tomorrow

## 2019-12-24 NOTE — Patient Instructions (Addendum)
DUE TO COVID-19 ONLY ONE VISITOR IS ALLOWED TO COME WITH YOU AND STAY IN THE WAITING ROOM ONLY DURING PRE OP AND PROCEDURE.   IF YOU WILL BE ADMITTED INTO THE HOSPITAL YOU ARE ALLOWED ONE SUPPORT PERSON DURING VISITATION HOURS ONLY (10AM -8PM)   . The support person may change daily. . The support person must pass our screening, gel in and out, and wear a mask at all times, including in the patient's room. . Patients must also wear a mask when staff or their support person are in the room.   COVID SWAB TESTING MUST BE COMPLETED ON:  Friday, 12-25-19 @ 9:45 AM   [address] [place], [place] [address]  (Must self quarantine after testing. Follow instructions on handout.)    Your procedure is scheduled on:  Monday, 12-28-19   Report to [facility] Main  Entrance   Report to admitting at 12:15 PM   Call this number if you have problems the morning of surgery [phone]   Do not eat food :After Midnight.   May have liquids until 11:45 AM  day of surgery  CLEAR LIQUID DIET  Foods Allowed                                                                     Foods Excluded  Water, Black Coffee and tea, regular and decaf             liquids that you cannot  Plain Jell-O in any flavor  (No red)                                    see through such as: Fruit ices (not with fruit pulp)                                      milk, soups, orange juice              Iced Popsicles (No red)                                      All solid food                                   Apple juices Sports drinks like Gatorade (No red) Lightly seasoned clear broth or consume(fat free) Sugar, honey syrup    Drink G2 presurgical drink at 11:45 AM.  Oral Hygiene is also important to reduce your risk of infection.                                    Remember - BRUSH YOUR TEETH THE MORNING OF SURGERY WITH YOUR REGULAR TOOTHPASTE   Do NOT smoke after Midnight   Bring CPAP mask and tubing day of  surgery.   Take these medicines the morning of surgery with A SIP OF  WATER:  Sertraline, Montelukast, Loratadine, Atorvastatin, Bupropion,     Hydroxyzine                  How to Manage Your Diabetes Before and After Surgery  Why is it important to control my blood sugar before and after surgery? . Improving blood sugar levels before and after surgery helps healing and can limit problems. . A way of improving blood sugar control is eating a healthy diet by: o  Eating less sugar and carbohydrates o  Increasing activity/exercise o  Talking with your doctor about reaching your blood sugar goals . High blood sugars (greater than 180 mg/dL) can raise your risk of infections and slow your recovery, so you will need to focus on controlling your diabetes during the weeks before surgery. . Make sure that the doctor who takes care of your diabetes knows about your planned surgery including the date and location.  How do I manage my blood sugar before surgery? . Check your blood sugar at least 4 times a day, starting 2 days before surgery, to make sure that the level is not too high or low. o Check your blood sugar the morning of your surgery when you wake up and every 2 hours until you get to the Short Stay unit. . If your blood sugar is less than 70 mg/dL, you will need to treat for low blood sugar: o Do not take insulin. o Treat a low blood sugar (less than 70 mg/dL) with  cup of clear juice (cranberry or apple), 4 glucose tablets, OR glucose gel. o Recheck blood sugar in 15 minutes after treatment (to make sure it is greater than 70 mg/dL). If your blood sugar is not greater than 70 mg/dL on recheck, call [phone] for further instructions. . Report your blood sugar to the short stay nurse when you get to Short Stay.  . If you are admitted to the hospital after surgery: o Your blood sugar will be checked by the staff and you will probably be given insulin after surgery (instead of oral  diabetes medicines) to make sure you have good blood sugar levels. o The goal for blood sugar control after surgery is 80-180 mg/dL.   WHAT DO I DO ABOUT MY DIABETES MEDICATION?  Marland Kitchen Do not take oral diabetes medicines (pills) the morning of surgery.  . THE DAY BEFORE SURGERY:    Take 22 units of Insulin Glargine -       Take Metformin as prescribed.       . THE MORNING OF SURGERY:    Do not take Metformin.   Reviewed and Endorsed by [facility] Patient Education Committee, August 2015                You may not have any metal on your body including hair pins, jewelry, and body piercings             Do not wear make-up, lotions, powders, perfumes/cologne, or deodorant             Do not wear nail polish.  Do not shave  48 hours prior to surgery.             Do not bring valuables to the hospital. [place].   Contacts, dentures or bridgework may not be worn into surgery.   Bring small overnight bag day of surgery.  Please read over the following fact sheets you were given: IF YOU HAVE QUESTIONS ABOUT YOUR PRE OP INSTRUCTIONS PLEASE CALL [facility]- Preparing for Total Shoulder Arthroplasty    Before surgery, you can play an important role. Because skin is not sterile, your skin needs to be as free of germs as possible. You can reduce the number of germs on your skin by using the following products. . Benzoyl Peroxide Gel o Reduces the number of germs present on the skin o Applied twice a day to shoulder area starting two days before surgery    ==================================================================  Please follow these instructions carefully:  BENZOYL PEROXIDE 5% GEL  Please do not use if you have an allergy to benzoyl peroxide.   If your skin becomes reddened/irritated stop using the benzoyl peroxide.  Starting two days before surgery, apply as follows: 1. Apply benzoyl peroxide in the morning  and at night. Apply after taking a shower. If you are not taking a shower clean entire shoulder front, back, and side along with the armpit with a clean wet washcloth.  2. Place a quarter-sized dollop on your shoulder and rub in thoroughly, making sure to cover the front, back, and side of your shoulder, along with the armpit.   2 days before ____ AM   ____ PM              1 day before ____ AM   ____ PM                         3. Do this twice a day for two days.  (Last application is the night before surgery, AFTER using the CHG soap as described below).  4. Do NOT apply benzoyl peroxide gel on the day of surgery.   [facility] - Preparing for Surgery Before surgery, you can play an important role.  Because skin is not sterile, your skin needs to be as free of germs as possible.  You can reduce the number of germs on your skin by washing with CHG (chlorahexidine gluconate) soap before surgery.  CHG is an antiseptic cleaner which kills germs and bonds with the skin to continue killing germs even after washing. Please DO NOT use if you have an allergy to CHG or antibacterial soaps.  If your skin becomes reddened/irritated stop using the CHG and inform your nurse when you arrive at Short Stay. Do not shave (including legs and underarms) for at least 48 hours prior to the first CHG shower.  You may shave your face/neck.  Please follow these instructions carefully:  1.  Shower with CHG Soap the night before surgery and the  morning of surgery.  2.  If you choose to wash your hair, wash your hair first as usual with your normal  shampoo.  3.  After you shampoo, rinse your hair and body thoroughly to remove the shampoo.                             4.  Use CHG as you would any other liquid soap.  You can apply chg directly to the skin and wash.  Gently with a scrungie or clean washcloth.  5.  Apply the CHG Soap to your body ONLY FROM THE NECK DOWN.   Do   not use on face/ open  Wound or open sores. Avoid contact with eyes, ears mouth and   genitals (private parts).                       Wash face,  Genitals (private parts) with your normal soap.             6.  Wash thoroughly, paying special attention to the area where your    surgery  will be performed.  7.  Thoroughly rinse your body with warm water from the neck down.  8.  DO NOT shower/wash with your normal soap after using and rinsing off the CHG Soap.                9.  Pat yourself dry with a clean towel.            10.  Wear clean pajamas.            11.  Place clean sheets on your bed the night of your first shower and do not  sleep with pets. Day of Surgery : Do not apply any lotions/deodorants the morning of surgery.  Please wear clean clothes to the hospital/surgery center.  FAILURE TO FOLLOW THESE INSTRUCTIONS MAY RESULT IN THE CANCELLATION OF YOUR SURGERY  PATIENT SIGNATURE_________________________________  NURSE SIGNATURE__________________________________  ________________________________________________________________________   Adam Phenix  An incentive spirometer is a tool that can help keep your lungs clear and active. This tool measures how well you are filling your lungs with each breath. Taking long deep breaths may help reverse or decrease the chance of developing breathing (pulmonary) problems (especially infection) following:  A long period of time when you are unable to move or be active. BEFORE THE PROCEDURE   If the spirometer includes an indicator to show your best effort, your nurse or respiratory therapist will set it to a desired goal.  If possible, sit up straight or lean slightly forward. Try not to slouch.  Hold the incentive spirometer in an upright position. INSTRUCTIONS FOR USE  1. Sit on the edge of your bed if possible, or sit up as far as you can in bed or on a chair. 2. Hold the incentive spirometer in an upright position. 3. Breathe out  normally. 4. Place the mouthpiece in your mouth and seal your lips tightly around it. 5. Breathe in slowly and as deeply as possible, raising the piston or the ball toward the top of the column. 6. Hold your breath for 3-5 seconds or for as long as possible. Allow the piston or ball to fall to the bottom of the column. 7. Remove the mouthpiece from your mouth and breathe out normally. 8. Rest for a few seconds and repeat Steps 1 through 7 at least 10 times every 1-2 hours when you are awake. Take your time and take a few normal breaths between deep breaths. 9. The spirometer may include an indicator to show your best effort. Use the indicator as a goal to work toward during each repetition. 10. After each set of 10 deep breaths, practice coughing to be sure your lungs are clear. If you have an incision (the cut made at the time of surgery), support your incision when coughing by placing a pillow or rolled up towels firmly against it. Once you are able to get out of bed, walk around indoors and cough well. You may stop using the incentive spirometer when instructed by your caregiver.  RISKS AND COMPLICATIONS  Take your time  so you do not get dizzy or light-headed.  If you are in pain, you may need to take or ask for pain medication before doing incentive spirometry. It is harder to take a deep breath if you are having pain. AFTER USE  Rest and breathe slowly and easily.  It can be helpful to keep track of a log of your progress. Your caregiver can provide you with a simple table to help with this. If you are using the spirometer at home, follow these instructions: [place] IF:   You are having difficultly using the spirometer.  You have trouble using the spirometer as often as instructed.  Your pain medication is not giving enough relief while using the spirometer.  You develop fever of 100.5 F (38.1 C) or higher. SEEK IMMEDIATE MEDICAL CARE IF:   You cough up bloody sputum  that had not been present before.  You develop fever of 102 F (38.9 C) or greater.  You develop worsening pain at or near the incision site. MAKE SURE YOU:   Understand these instructions.  Will watch your condition.  Will get help right away if you are not doing well or get worse. Document Released: 06/25/2006 Document Revised: 05/07/2011 Document Reviewed: 08/26/2006 [facility] Patient Information 2014 [place], [place].   ________________________________________________________________________

## 2019-12-25 ENCOUNTER — Encounter (HOSPITAL_COMMUNITY)
Admission: RE | Admit: 2019-12-25 | Discharge: 2019-12-25 | Disposition: A | Discharge status: Home / Self Care | Payer: Medicare Other | Source: Ambulatory Visit | Attending: Orthopaedic Surgery | Admitting: Orthopaedic Surgery

## 2019-12-25 ENCOUNTER — Encounter (HOSPITAL_COMMUNITY): Payer: Medicare Other

## 2019-12-25 ENCOUNTER — Telehealth (HOSPITAL_COMMUNITY): Payer: Self-pay | Admitting: Emergency Medicine

## 2019-12-25 ENCOUNTER — Other Ambulatory Visit: Payer: Self-pay | Admitting: Medical

## 2019-12-25 ENCOUNTER — Other Ambulatory Visit: Payer: Medicare Other

## 2019-12-25 ENCOUNTER — Ambulatory Visit (HOSPITAL_COMMUNITY)
Admission: RE | Admit: 2019-12-25 | Discharge: 2019-12-25 | Disposition: A | Discharge status: Home / Self Care | Payer: Medicare Other | Source: Ambulatory Visit | Attending: Medical | Admitting: Medical

## 2019-12-25 ENCOUNTER — Encounter (HOSPITAL_COMMUNITY): Payer: Self-pay

## 2019-12-25 ENCOUNTER — Other Ambulatory Visit: Payer: Self-pay

## 2019-12-25 ENCOUNTER — Other Ambulatory Visit (HOSPITAL_COMMUNITY)
Admission: RE | Admit: 2019-12-25 | Discharge: 2019-12-25 | Disposition: A | Discharge status: Home / Self Care | Payer: Medicare Other | Source: Ambulatory Visit

## 2019-12-25 DIAGNOSIS — X58XXXA Exposure to other specified factors, initial encounter: Secondary | ICD-10-CM | POA: Insufficient documentation

## 2019-12-25 DIAGNOSIS — I2510 Atherosclerotic heart disease of native coronary artery without angina pectoris: Secondary | ICD-10-CM

## 2019-12-25 DIAGNOSIS — S42202A Unspecified fracture of upper end of left humerus, initial encounter for closed fracture: Secondary | ICD-10-CM | POA: Insufficient documentation

## 2019-12-25 DIAGNOSIS — Z01818 Encounter for other preprocedural examination: Secondary | ICD-10-CM | POA: Insufficient documentation

## 2019-12-25 DIAGNOSIS — I700 Atherosclerosis of aorta: Secondary | ICD-10-CM | POA: Insufficient documentation

## 2019-12-25 DIAGNOSIS — Z20822 Contact with and (suspected) exposure to covid-19: Secondary | ICD-10-CM | POA: Insufficient documentation

## 2019-12-25 DIAGNOSIS — Z79899 Other long term (current) drug therapy: Secondary | ICD-10-CM | POA: Insufficient documentation

## 2019-12-25 DIAGNOSIS — E119 Type 2 diabetes mellitus without complications: Secondary | ICD-10-CM | POA: Insufficient documentation

## 2019-12-25 DIAGNOSIS — R931 Abnormal findings on diagnostic imaging of heart and coronary circulation: Secondary | ICD-10-CM

## 2019-12-25 DIAGNOSIS — G4733 Obstructive sleep apnea (adult) (pediatric): Secondary | ICD-10-CM | POA: Diagnosis not present

## 2019-12-25 DIAGNOSIS — Z7984 Long term (current) use of oral hypoglycemic drugs: Secondary | ICD-10-CM | POA: Diagnosis not present

## 2019-12-25 DIAGNOSIS — J449 Chronic obstructive pulmonary disease, unspecified: Secondary | ICD-10-CM | POA: Insufficient documentation

## 2019-12-25 DIAGNOSIS — K760 Fatty (change of) liver, not elsewhere classified: Secondary | ICD-10-CM | POA: Diagnosis not present

## 2019-12-25 DIAGNOSIS — Z01812 Encounter for preprocedural laboratory examination: Secondary | ICD-10-CM | POA: Insufficient documentation

## 2019-12-25 HISTORY — DX: Anxiety disorder, unspecified: F41.9

## 2019-12-25 HISTORY — DX: Other specified postprocedural states: Z98.890

## 2019-12-25 HISTORY — DX: Gastro-esophageal reflux disease without esophagitis: K21.9

## 2019-12-25 HISTORY — DX: Nausea with vomiting, unspecified: Z98.890

## 2019-12-25 HISTORY — DX: Nausea with vomiting, unspecified: R11.2

## 2019-12-25 LAB — BASIC METABOLIC PANEL
Anion gap: 12 (ref 5–15)
BUN: 16 mg/dL (ref 8–23)
CO2: 27 mmol/L (ref 22–32)
Calcium: 9.2 mg/dL (ref 8.9–10.3)
Chloride: 101 mmol/L (ref 98–111)
Creatinine, Ser: 0.77 mg/dL (ref 0.44–1.00)
GFR, Estimated: >60 mL/min (ref >60)
Glucose, Bld: 128 mg/dL — ABNORMAL HIGH (ref 70–99)
Potassium: 4 mmol/L (ref 3.5–5.1)
Sodium: 140 mmol/L (ref 135–145)

## 2019-12-25 LAB — CBC
HCT: 35.1 % — ABNORMAL LOW (ref 36.0–46.0)
Hemoglobin: 10.9 g/dL — ABNORMAL LOW (ref 12.0–15.0)
MCH: 25.5 pg — ABNORMAL LOW (ref 26.0–34.0)
MCHC: 31.1 g/dL (ref 30.0–36.0)
MCV: 82.2 fL (ref 80.0–100.0)
Platelets: 317 10*3/uL (ref 150–400)
RBC: 4.27 MIL/uL (ref 3.87–5.11)
RDW: 15.5 % (ref 11.5–15.5)
WBC: 9.8 10*3/uL (ref 4.0–10.5)
nRBC: 0 % (ref 0.0–0.2)

## 2019-12-25 LAB — GLUCOSE, CAPILLARY: Glucose-Capillary: 130 mg/dL — ABNORMAL HIGH (ref 70–99)

## 2019-12-25 LAB — SURGICAL PCR SCREEN
MRSA, PCR: NEGATIVE
Staphylococcus aureus: NEGATIVE

## 2019-12-25 MED ORDER — METOPROLOL TARTRATE 100 MG PO TABS: 100.0000 mg | ORAL_TABLET | Freq: Two times a day (BID) | ORAL | 11 refills | Status: DC

## 2019-12-25 MED ORDER — IOHEXOL 350 MG/ML SOLN
80.0000 mL | Freq: Once | INTRAVENOUS | Status: AC | PRN
  Administered 2019-12-25: 80 mL via INTRAVENOUS

## 2019-12-25 MED ORDER — NITROGLYCERIN 0.4 MG SL SUBL: 0.8000 mg | SUBLINGUAL_TABLET | Freq: Once | SUBLINGUAL | Status: AC

## 2019-12-25 MED ORDER — NITROGLYCERIN 0.4 MG SL SUBL
SUBLINGUAL_TABLET | SUBLINGUAL | Status: AC
  Administered 2019-12-25: 0.8 mg via SUBLINGUAL
  Filled 2019-12-25: qty 2

## 2019-12-25 MED FILL — METOPROLOL TARTRATE 100 MG: 100 | 30 days supply | Qty: 60 | Fill #0

## 2019-12-25 NOTE — Telephone Encounter (Signed)
   Primary Cardiologist: Fransico Him, MD  Chart reviewed as part of pre-operative protocol coverage.  Coronary CTA reviewed with Dr. Radford Pax. At most, moderate stenosis noted in non-dominant RCA. Based on these results, Sarah Miller would be at acceptable risk for the planned procedure without further cardiovascular testing.   The patient was advised that if she develops new symptoms prior to surgery to contact our office to arrange for a follow-up visit, and she verbalized understanding.  I will route this recommendation to the requesting party via Epic fax function and remove from pre-op pool.  Please call with questions.   Abigail Butts, PA-C 12/25/2019, 4:02 PM

## 2019-12-25 NOTE — Telephone Encounter (Signed)
Phone call to daughter who is with patient while having PAT completed at [facility] hospital.   Daughter confirmed that she just got off the phone with Daleen Snook who prescribed 100mg  metoprolol and I told the daughter to have the patient take it as soon as possible and that she will have to drive to [facility] for the cardiac CT.   I instructed to go to the main entrance with valet parking and check in at radiology desk at 10:15 - 10:30a  Daughter appreciated the call and instructions.  Marchia Bond RN Navigator Cardiac Imaging [facility] [phone] Office  [phone] Cell

## 2019-12-25 NOTE — Progress Notes (Signed)
Anesthesia Chart Review:   Case: [id number] Date/Time: 12/28/19 1430   Procedure: REVERSE SHOULDER ARTHROPLASTY (Left Shoulder)   Anesthesia type: Choice   Pre-op diagnosis: LEFT PROXIMAL HUMERUS FRACTURE   Location: [facility] ROOM 03 / [facility]   Surgeons: Hiram Gash, MD      DISCUSSION:  Pt is a 74 year old with hx DM, OSA, COPD, aortic atherosclerosis. Coronary calcifications on CT.   - HbA1c result pending  Surgeon's office asked for cardiac clearance. Dr. Radford Pax ordered CTA to be done before surgery. Results below; FFR analysis pending. Clearance pending.     VS: BP (!) 167/80   Pulse 77   Temp 36.8 C (Oral)   Resp 18   Ht 5\' 1"  (1.549 m)   Wt 108 kg   SpO2 97%   BMI 44.98 kg/m    PROVIDERS: - PCP is Harlan Stains, MD - Cardiologist is Fransico Him, MD. Initial office visit 11/30/19; referred to cardiology by PCP for coronary calcifications on chest CT   LABS: Labs reviewed: Acceptable for surgery.  - HbA1c result pending  (all labs ordered are listed, but only abnormal results are displayed)  Labs Reviewed  BASIC METABOLIC PANEL - Abnormal; Notable for the following components:      Result Value   Glucose, Bld 128 (*)    All other components within normal limits  CBC - Abnormal; Notable for the following components:   Hemoglobin 10.9 (*)    HCT 35.1 (*)    MCH 25.5 (*)    All other components within normal limits  GLUCOSE, CAPILLARY - Abnormal; Notable for the following components:   Glucose-Capillary 130 (*)    All other components within normal limits  SURGICAL PCR SCREEN  HEMOGLOBIN A1C     IMAGES: CT chest lung cancer screening 08/25/19:  1. Lung-RADS 2S, benign appearance or behavior. Continue annual screening with low-dose chest CT without contrast in 12 months. 2. The "S" modifier above refers to potentially clinically significant non lung cancer related findings. Specifically, there is aortic atherosclerosis, in addition to 3 vessel coronary artery  disease. Assessment for potential risk factor modification, dietary therapy or pharmacologic therapy may be warranted, if clinically indicated. 3. New but nonacute compression fracture of superior endplate of Z56 with 38% loss of anterior vertebral body height.    EKG 11/30/19: NSR. RBBB.   CV: Coronary CTA 12/25/19:   1. Coronary calcium score of 60. This was 72nd percentile for age and sex matched control. 2. Normal coronary origin with left dominance. 3. CAD-RADS 3; one lesion with moderate stenosis. Consider symptom-guided anti-ischemic pharmacotherapy as well as risk factor modification per guideline directed care. Additional analysis with CT FFR will be submitted. 4.  Atypical pulmonary vein drainage as above. 5.  Aortic Atherosclerosis noted. - Note FFR analysis still pending   Echo 04/17/13:  - Left ventricle: The cavity size was normal. There was mild focal basal hypertrophy of the septum. Systolic function was normal. The estimated ejection fraction was in the range of 55% to 60%. Wall motion was normal; there were no regional wall motion abnormalities. There was an increased relative contribution of atrial contraction to ventricular filling. Doppler parameters are consistent with abnormal left ventricular relaxation (grade 1 diastolic dysfunction).     Past Medical History:  Diagnosis Date  . Allergic rhinitis   . Anxiety   . Aortic atherosclerosis ([place])   . Cancer [facility])    h/o melanoma '78 and 79  . Cervical arthritis  Dr Delman Kitten, S/P injection with good results.   Marland Kitchen COPD (chronic obstructive pulmonary disease) ([place])    with emphysema  . Coronary artery calcification seen on CAT scan   . Depression    and anxiety  . Diabetes mellitus without complication ([place])   . Early menopause   . Frozen shoulder    H/O frozen shoulder and rotator cuff tendonititis, L s/p injection  . GERD (gastroesophageal reflux disease)   . PONV (postoperative nausea and  vomiting)   . Sleep apnea   . Vitamin D deficiency     Past Surgical History:  Procedure Laterality Date  . ABDOMINAL HYSTERECTOMY    . CATARACT EXTRACTION W/ INTRAOCULAR LENS IMPLANT Bilateral   . FRACTURE SURGERY Left    wrist  . MELANOMA EXCISION      MEDICATIONS: . acetaminophen (TYLENOL) 500 MG tablet  . ARNUITY ELLIPTA 200 MCG/ACT AEPB  . ASMANEX HFA 100 MCG/ACT AERO  . atorvastatin (LIPITOR) 10 MG tablet  . Azelastine-Fluticasone 137-50 MCG/ACT SUSP  . buPROPion (WELLBUTRIN XL) 300 MG 24 hr tablet  . celecoxib (CELEBREX) 100 MG capsule  . desoximetasone (TOPICORT) 0.25 % cream  . fluticasone (FLONASE) 50 MCG/ACT nasal spray  . hydrOXYzine (ATARAX/VISTARIL) 25 MG tablet  . Insulin Glargine (BASAGLAR KWIKPEN) 100 UNIT/ML  . loratadine (CLARITIN) 10 MG tablet  . metFORMIN (GLUCOPHAGE-XR) 500 MG 24 hr tablet  . metoprolol tartrate (LOPRESSOR) 100 MG tablet  . montelukast (SINGULAIR) 10 MG tablet  . nystatin (MYCOSTATIN/NYSTOP) powder  . sertraline (ZOLOFT) 50 MG tablet  . triamcinolone cream (KENALOG) 0.5 %   No current facility-administered medications for this encounter.   Willeen Cass, PhD, FNP-BC [facility] Stay Surgical [facility]/Anesthesiology Phone: [phone] 12/25/2019 2:59 PM

## 2019-12-25 NOTE — Telephone Encounter (Signed)
   Primary Cardiologist: Fransico Him, MD  Chart reviewed as part of pre-operative protocol coverage.   Per Dr. Radford Pax, patient will need a coronary CTA for preoperative evaluation. Spoke with Marchia Bond and we are able to complete this study today. Discussed plan with patients daughter, Rosendo Gros (DPR on file) who is with the patient this morning for her preop exam at [facility]. She will pick up a one time dose for metoprolol tartrate 100mg  to take now. Will plan to complete her coronary CTA at 10:45am today. Further preop assessment pending these results.   Abigail Butts, PA-C 12/25/2019, 9:55 AM

## 2019-12-25 NOTE — Progress Notes (Signed)
CBC sent to Dr. Varkey to review. 

## 2019-12-25 NOTE — Anesthesia Preprocedure Evaluation (Addendum)
Anesthesia Evaluation  Patient identified by MRN, date of birth, ID band Patient awake    Reviewed: Allergy & Precautions, NPO status , Patient's Chart, lab work & pertinent test results  History of Anesthesia Complications (+) PONV  Airway Mallampati: II  TM Distance: >3 FB Neck ROM: Full    Dental  (+) Edentulous Upper, Edentulous Lower   Pulmonary sleep apnea and Continuous Positive Airway Pressure Ventilation , COPD, former smoker,  12/25/2019 SARS coronavirus NEG   breath sounds clear to auscultation       Cardiovascular hypertension, Pt. on medications and Pt. on home beta blockers (-) angina+ CAD (coronary calcification seen on CT)   Rhythm:Regular Rate:Normal  '15 ECHO: EF 55-60^, no significant valvular abnormalities '15 Stress: normal   Neuro/Psych Anxiety Depression negative neurological ROS     GI/Hepatic Neg liver ROS, GERD  Controlled,  Endo/Other  diabetes, Insulin Dependent, Oral Hypoglycemic AgentsMorbid obesity  Renal/GU negative Renal ROS     Musculoskeletal  (+) Arthritis , Osteoarthritis,    Abdominal (+) + obese,   Peds  Hematology negative hematology ROS (+)   Anesthesia Other Findings   Reproductive/Obstetrics                           Anesthesia Physical Anesthesia Plan  ASA: III  Anesthesia Plan: General   Post-op Pain Management: GA combined w/ Regional for post-op pain   Induction: Intravenous  PONV Risk Score and Plan: 4 or greater and Ondansetron, Dexamethasone, Treatment may vary due to age or medical condition and Propofol infusion  Airway Management Planned: Oral ETT  Additional Equipment: None  Intra-op Plan:   Post-operative Plan: Extubation in OR  Informed Consent: I have reviewed the patients History and Physical, chart, labs and discussed the procedure including the risks, benefits and alternatives for the proposed anesthesia with the  patient or authorized representative who has indicated his/her understanding and acceptance.     Dental advisory given  Plan Discussed with: CRNA and Surgeon  Anesthesia Plan Comments: (See APP note by Durel Salts, FNP. HbA1c result pending; cardiac clearance pending after CTA coronaries 12/25/19.  Plan routine monitors, GETA with interscalene block for post op analgesia)      Anesthesia Quick Evaluation

## 2019-12-26 DIAGNOSIS — R931 Abnormal findings on diagnostic imaging of heart and coronary circulation: Secondary | ICD-10-CM | POA: Diagnosis not present

## 2019-12-26 DIAGNOSIS — I2510 Atherosclerotic heart disease of native coronary artery without angina pectoris: Secondary | ICD-10-CM | POA: Diagnosis not present

## 2019-12-26 LAB — HEMOGLOBIN A1C
Hgb A1c MFr Bld: 7.1 % — ABNORMAL HIGH (ref 4.8–5.6)
Mean Plasma Glucose: 157 mg/dL

## 2019-12-26 LAB — SARS CORONAVIRUS 2 (TAT 6-24 HRS): SARS Coronavirus 2: NEGATIVE

## 2019-12-28 ENCOUNTER — Ambulatory Visit (HOSPITAL_COMMUNITY): Payer: Medicare Other | Admitting: Emergency Medicine

## 2019-12-28 ENCOUNTER — Encounter (HOSPITAL_COMMUNITY): Payer: Self-pay | Admitting: Orthopaedic Surgery

## 2019-12-28 ENCOUNTER — Encounter (HOSPITAL_COMMUNITY)
Admission: RE | Disposition: A | Discharge status: Home / Self Care | Payer: Self-pay | Source: Ambulatory Visit | Attending: Orthopaedic Surgery

## 2019-12-28 ENCOUNTER — Ambulatory Visit (HOSPITAL_COMMUNITY): Payer: Medicare Other

## 2019-12-28 ENCOUNTER — Ambulatory Visit (HOSPITAL_COMMUNITY)
Admission: RE | Admit: 2019-12-28 | Discharge: 2019-12-28 | Disposition: A | Discharge status: Home / Self Care | Payer: Medicare Other | Source: Ambulatory Visit | Attending: Orthopaedic Surgery | Admitting: Orthopaedic Surgery

## 2019-12-28 ENCOUNTER — Ambulatory Visit (HOSPITAL_COMMUNITY): Payer: Medicare Other | Admitting: Anesthesiology

## 2019-12-28 DIAGNOSIS — W19XXXA Unspecified fall, initial encounter: Secondary | ICD-10-CM | POA: Diagnosis not present

## 2019-12-28 DIAGNOSIS — Z96612 Presence of left artificial shoulder joint: Secondary | ICD-10-CM | POA: Diagnosis not present

## 2019-12-28 DIAGNOSIS — I4510 Unspecified right bundle-branch block: Secondary | ICD-10-CM | POA: Diagnosis not present

## 2019-12-28 DIAGNOSIS — Z885 Allergy status to narcotic agent status: Secondary | ICD-10-CM | POA: Insufficient documentation

## 2019-12-28 DIAGNOSIS — S42202A Unspecified fracture of upper end of left humerus, initial encounter for closed fracture: Secondary | ICD-10-CM | POA: Diagnosis not present

## 2019-12-28 DIAGNOSIS — Z09 Encounter for follow-up examination after completed treatment for conditions other than malignant neoplasm: Secondary | ICD-10-CM

## 2019-12-28 DIAGNOSIS — Z87891 Personal history of nicotine dependence: Secondary | ICD-10-CM | POA: Insufficient documentation

## 2019-12-28 DIAGNOSIS — J449 Chronic obstructive pulmonary disease, unspecified: Secondary | ICD-10-CM | POA: Diagnosis not present

## 2019-12-28 DIAGNOSIS — E119 Type 2 diabetes mellitus without complications: Secondary | ICD-10-CM | POA: Diagnosis not present

## 2019-12-28 DIAGNOSIS — Z880 Allergy status to penicillin: Secondary | ICD-10-CM | POA: Diagnosis not present

## 2019-12-28 DIAGNOSIS — G8918 Other acute postprocedural pain: Secondary | ICD-10-CM | POA: Diagnosis not present

## 2019-12-28 DIAGNOSIS — Z471 Aftercare following joint replacement surgery: Secondary | ICD-10-CM | POA: Diagnosis not present

## 2019-12-28 HISTORY — PX: REVERSE SHOULDER ARTHROPLASTY: SHX5054

## 2019-12-28 LAB — GLUCOSE, CAPILLARY: Glucose-Capillary: 120 mg/dL — ABNORMAL HIGH (ref 70–99)

## 2019-12-28 SURGERY — ARTHROPLASTY, SHOULDER, TOTAL, REVERSE
Anesthesia: General | Site: Shoulder | Laterality: Left

## 2019-12-28 MED ORDER — VANCOMYCIN HCL 1 G IV SOLR
INTRAVENOUS | Status: DC | PRN
  Administered 2019-12-28: 1000 mg via TOPICAL

## 2019-12-28 MED ORDER — PROMETHAZINE HCL 25 MG/ML IJ SOLN: 6.2500 mg | INTRAMUSCULAR | Status: DC | PRN

## 2019-12-28 MED ORDER — FENTANYL CITRATE (PF) 100 MCG/2ML IJ SOLN
50.0000 ug | INTRAMUSCULAR | Status: DC
  Administered 2019-12-28: 50 ug via INTRAVENOUS
  Filled 2019-12-28: qty 2

## 2019-12-28 MED ORDER — BUPIVACAINE LIPOSOME 1.3 % IJ SUSP
INTRAMUSCULAR | Status: DC | PRN
  Administered 2019-12-28: 10 mL via PERINEURAL

## 2019-12-28 MED ORDER — ACETAMINOPHEN 500 MG PO TABS: 1000.0000 mg | ORAL_TABLET | Freq: Three times a day (TID) | ORAL | 0 refills | Status: AC

## 2019-12-28 MED ORDER — ORAL CARE MOUTH RINSE: 15.0000 mL | Freq: Once | OROMUCOSAL | Status: AC

## 2019-12-28 MED ORDER — SUGAMMADEX SODIUM 500 MG/5ML IV SOLN
INTRAVENOUS | Status: DC | PRN
  Administered 2019-12-28: 300 mg via INTRAVENOUS

## 2019-12-28 MED ORDER — MIDAZOLAM HCL 2 MG/2ML IJ SOLN
INTRAMUSCULAR | Status: AC
  Filled 2019-12-28: qty 2

## 2019-12-28 MED ORDER — 0.9 % SODIUM CHLORIDE (POUR BTL) OPTIME
TOPICAL | Status: DC | PRN
  Administered 2019-12-28: 1000 mL

## 2019-12-28 MED ORDER — MIDAZOLAM HCL 2 MG/2ML IJ SOLN
INTRAMUSCULAR | Status: DC | PRN
  Administered 2019-12-28: 1 mg via INTRAVENOUS

## 2019-12-28 MED ORDER — TRANEXAMIC ACID-NACL 1000-0.7 MG/100ML-% IV SOLN
1000.0000 mg | INTRAVENOUS | Status: AC
  Administered 2019-12-28: 1000 mg via INTRAVENOUS
  Filled 2019-12-28: qty 100

## 2019-12-28 MED ORDER — FENTANYL CITRATE (PF) 100 MCG/2ML IJ SOLN
INTRAMUSCULAR | Status: AC
  Filled 2019-12-28: qty 2

## 2019-12-28 MED ORDER — ONDANSETRON HCL 4 MG/2ML IJ SOLN
INTRAMUSCULAR | Status: DC | PRN
  Administered 2019-12-28: 4 mg via INTRAVENOUS

## 2019-12-28 MED ORDER — BUPIVACAINE-EPINEPHRINE (PF) 0.25% -1:200000 IJ SOLN
INTRAMUSCULAR | Status: AC
  Filled 2019-12-28: qty 30

## 2019-12-28 MED ORDER — PHENYLEPHRINE HCL-NACL 10-0.9 MG/250ML-% IV SOLN
INTRAVENOUS | Status: AC
  Filled 2019-12-28: qty 250

## 2019-12-28 MED ORDER — FENTANYL CITRATE (PF) 100 MCG/2ML IJ SOLN
INTRAMUSCULAR | Status: DC | PRN
Start: 2019-12-28 — End: 2019-12-28
  Administered 2019-12-28: 50 ug via INTRAVENOUS

## 2019-12-28 MED ORDER — CHLORHEXIDINE GLUCONATE 0.12 % MT SOLN
15.0000 mL | Freq: Once | OROMUCOSAL | Status: AC
  Administered 2019-12-28: 15 mL via OROMUCOSAL

## 2019-12-28 MED ORDER — SODIUM CHLORIDE 0.9 % IR SOLN
Status: DC | PRN
  Administered 2019-12-28: 1000 mL

## 2019-12-28 MED ORDER — PROPOFOL 10 MG/ML IV BOLUS
INTRAVENOUS | Status: DC | PRN
  Administered 2019-12-28: 50 mg via INTRAVENOUS
  Administered 2019-12-28: 150 mg via INTRAVENOUS
  Administered 2019-12-28: 50 mg via INTRAVENOUS

## 2019-12-28 MED ORDER — LACTATED RINGERS IV BOLUS
500.0000 mL | Freq: Once | INTRAVENOUS | Status: AC
  Administered 2019-12-28: 500 mL via INTRAVENOUS

## 2019-12-28 MED ORDER — ACETAMINOPHEN 500 MG PO TABS
1000.0000 mg | ORAL_TABLET | Freq: Once | ORAL | Status: AC
  Administered 2019-12-28: 1000 mg via ORAL
  Filled 2019-12-28: qty 2

## 2019-12-28 MED ORDER — STERILE WATER FOR IRRIGATION IR SOLN
Status: DC | PRN
  Administered 2019-12-28: 2000 mL

## 2019-12-28 MED ORDER — DEXAMETHASONE SODIUM PHOSPHATE 10 MG/ML IJ SOLN
INTRAMUSCULAR | Status: DC | PRN
  Administered 2019-12-28: 5 mg via INTRAVENOUS

## 2019-12-28 MED ORDER — OXYCODONE HCL 5 MG/5ML PO SOLN: 5.0000 mg | Freq: Once | ORAL | Status: DC | PRN

## 2019-12-28 MED ORDER — OXYCODONE HCL 5 MG PO TABS: 5.0000 mg | ORAL_TABLET | Freq: Once | ORAL | Status: DC | PRN

## 2019-12-28 MED ORDER — PHENYLEPHRINE HCL-NACL 10-0.9 MG/250ML-% IV SOLN
INTRAVENOUS | Status: DC | PRN
  Administered 2019-12-28: 10 ug/min via INTRAVENOUS

## 2019-12-28 MED ORDER — BUPIVACAINE-EPINEPHRINE (PF) 0.5% -1:200000 IJ SOLN
INTRAMUSCULAR | Status: DC | PRN
  Administered 2019-12-28: 10 mL via PERINEURAL

## 2019-12-28 MED ORDER — LIDOCAINE 2% (20 MG/ML) 5 ML SYRINGE
INTRAMUSCULAR | Status: DC | PRN
  Administered 2019-12-28: 40 mg via INTRAVENOUS

## 2019-12-28 MED ORDER — MIDAZOLAM HCL 2 MG/2ML IJ SOLN
1.0000 mg | INTRAMUSCULAR | Status: DC
  Administered 2019-12-28: 1 mg via INTRAVENOUS
  Filled 2019-12-28: qty 2

## 2019-12-28 MED ORDER — ASPIRIN 81 MG PO CHEW: 81.0000 mg | CHEWABLE_TABLET | Freq: Two times a day (BID) | ORAL | 0 refills | Status: AC

## 2019-12-28 MED ORDER — ONDANSETRON HCL 4 MG PO TABS: 4.0000 mg | ORAL_TABLET | Freq: Three times a day (TID) | ORAL | 1 refills | Status: AC | PRN

## 2019-12-28 MED ORDER — CEFAZOLIN SODIUM-DEXTROSE 2-4 GM/100ML-% IV SOLN
2.0000 g | INTRAVENOUS | Status: AC
  Administered 2019-12-28: 2 g via INTRAVENOUS
  Filled 2019-12-28: qty 100

## 2019-12-28 MED ORDER — OMEPRAZOLE 20 MG PO CPDR: 20.0000 mg | DELAYED_RELEASE_CAPSULE | Freq: Every day | ORAL | 0 refills | Status: DC

## 2019-12-28 MED ORDER — VANCOMYCIN HCL 1000 MG IV SOLR
INTRAVENOUS | Status: AC
  Filled 2019-12-28: qty 1000

## 2019-12-28 MED ORDER — OXYCODONE HCL 5 MG PO TABS
ORAL_TABLET | ORAL | 0 refills | Status: AC
Start: 2019-12-28 — End: 2020-01-02

## 2019-12-28 MED ORDER — MEPERIDINE HCL 50 MG/ML IJ SOLN: 6.2500 mg | INTRAMUSCULAR | Status: DC | PRN

## 2019-12-28 MED ORDER — MIDAZOLAM HCL 2 MG/2ML IJ SOLN: 0.5000 mg | Freq: Once | INTRAMUSCULAR | Status: DC | PRN

## 2019-12-28 MED ORDER — ROCURONIUM BROMIDE 10 MG/ML (PF) SYRINGE
PREFILLED_SYRINGE | INTRAVENOUS | Status: DC | PRN
  Administered 2019-12-28: 70 mg via INTRAVENOUS

## 2019-12-28 MED ORDER — LACTATED RINGERS IV SOLN: INTRAVENOUS | Status: DC

## 2019-12-28 MED ORDER — HYDROMORPHONE HCL 1 MG/ML IJ SOLN: 0.2500 mg | INTRAMUSCULAR | Status: DC | PRN

## 2019-12-28 SURGICAL SUPPLY — 67 items
AID PSTN UNV HD RSTRNT DISP (MISCELLANEOUS) ×1
APL PRP STRL LF DISP 70% ISPRP (MISCELLANEOUS) ×2
BASEPLATE GLENOSPHERE 25 STD (Miscellaneous) ×2 IMPLANT
BIT DRILL 3.2 PERIPHERAL SCREW (BIT) ×1 IMPLANT
BLADE SAW SAG 73X25 THK (BLADE) ×1
BLADE SAW SGTL 73X25 THK (BLADE) ×1 IMPLANT
BSPLAT GLND STD 25 RVRS SHLDR (Miscellaneous) ×1 IMPLANT
CHLORAPREP W/TINT 26 (MISCELLANEOUS) ×4 IMPLANT
CLSR STERI-STRIP ANTIMIC 1/2X4 (GAUZE/BANDAGES/DRESSINGS) ×2 IMPLANT
COOLER ICEMAN CLASSIC (MISCELLANEOUS) IMPLANT
COVER BACK TABLE 60X90IN (DRAPES) IMPLANT
COVER SURGICAL LIGHT HANDLE (MISCELLANEOUS) ×2 IMPLANT
COVER WAND RF STERILE (DRAPES) ×2 IMPLANT
DRAPE INCISE IOBAN 66X45 STRL (DRAPES) ×2 IMPLANT
DRAPE ORTHO SPLIT 77X108 STRL (DRAPES) ×4
DRAPE SHEET LG 3/4 BI-LAMINATE (DRAPES) ×4 IMPLANT
DRAPE SURG ORHT 6 SPLT 77X108 (DRAPES) ×2 IMPLANT
DRSG AQUACEL AG ADV 3.5X 6 (GAUZE/BANDAGES/DRESSINGS) ×2 IMPLANT
ELECT BLADE TIP CTD 4 INCH (ELECTRODE) ×2 IMPLANT
ELECT REM PT RETURN 15FT ADLT (MISCELLANEOUS) ×2 IMPLANT
GLENOSPHERE REV SHOULDER 36 (Joint) ×1 IMPLANT
GLOVE BIO SURGEON STRL SZ 6.5 (GLOVE) ×4 IMPLANT
GLOVE BIOGEL PI IND STRL 6.5 (GLOVE) ×1 IMPLANT
GLOVE BIOGEL PI IND STRL 8 (GLOVE) ×1 IMPLANT
GLOVE BIOGEL PI INDICATOR 6.5 (GLOVE) ×1
GLOVE BIOGEL PI INDICATOR 8 (GLOVE) ×1
GLOVE ECLIPSE 8.0 STRL XLNG CF (GLOVE) ×4 IMPLANT
GOWN STRL REUS W/TWL LRG LVL3 (GOWN DISPOSABLE) ×2 IMPLANT
GOWN STRL REUS W/TWL XL LVL3 (GOWN DISPOSABLE) ×2 IMPLANT
GUIDEWIRE GLENOID 2.5X220 (WIRE) ×2 IMPLANT
HANDPIECE INTERPULSE COAX TIP (DISPOSABLE) ×2
HEMOSTAT SURGICEL 2X14 (HEMOSTASIS) IMPLANT
IMPL REVERSE SHOULDER 0X3.5 (Shoulder) ×1 IMPLANT
IMPLANT REVERSE SHOULDER 0X3.5 (Shoulder) ×2 IMPLANT
INSERT HUMERAL 36X6MM 12.5DEG (Insert) ×1 IMPLANT
KIT BASIN OR (CUSTOM PROCEDURE TRAY) ×2 IMPLANT
KIT STABILIZATION SHOULDER (MISCELLANEOUS) ×2 IMPLANT
KIT TURNOVER KIT A (KITS) IMPLANT
MANIFOLD NEPTUNE II (INSTRUMENTS) ×2 IMPLANT
NDL MAYO CATGUT SZ4 TPR NDL (NEEDLE) IMPLANT
NEEDLE MAYO CATGUT SZ4 (NEEDLE) ×2 IMPLANT
NS IRRIG 1000ML POUR BTL (IV SOLUTION) ×2 IMPLANT
PACK SHOULDER (CUSTOM PROCEDURE TRAY) ×2 IMPLANT
PAD COLD SHLDR WRAP-ON (PAD) IMPLANT
PENCIL SMOKE EVACUATOR (MISCELLANEOUS) IMPLANT
RESTRAINT HEAD UNIVERSAL NS (MISCELLANEOUS) ×2 IMPLANT
SCREW 5.5X22 (Screw) ×2 IMPLANT
SCREW BONE THREAD 6.5X35 (Screw) ×1 IMPLANT
SCREW PERIPHERAL 5.0X34 (Screw) ×2 IMPLANT
SET HNDPC FAN SPRY TIP SCT (DISPOSABLE) ×1 IMPLANT
SLING ULTRA II L (ORTHOPEDIC SUPPLIES) IMPLANT
SLING ULTRA III MED (ORTHOPEDIC SUPPLIES) ×2 IMPLANT
STEM HUMERAL 3B LONG 98 (Stem) IMPLANT
STEM HUMERAL SZ 3B LONG 98MM (Stem) ×2 IMPLANT
SUCTION FRAZIER HANDLE 12FR (TUBING) ×2
SUCTION TUBE FRAZIER 12FR DISP (TUBING) ×1 IMPLANT
SUT ETHIBOND 2 V 37 (SUTURE) ×2 IMPLANT
SUT ETHIBOND NAB CT1 #1 30IN (SUTURE) ×2 IMPLANT
SUT FIBERWIRE #5 38 CONV NDL (SUTURE)
SUT MNCRL AB 4-0 PS2 18 (SUTURE) ×2 IMPLANT
SUT VIC AB 0 CT1 36 (SUTURE) IMPLANT
SUT VIC AB 3-0 SH 27 (SUTURE) ×4
SUT VIC AB 3-0 SH 27X BRD (SUTURE) ×1 IMPLANT
SUTURE FIBERWR #5 38 CONV NDL (SUTURE) IMPLANT
TOWEL OR 17X26 10 PK STRL BLUE (TOWEL DISPOSABLE) ×2 IMPLANT
TUBE SUCTION HIGH CAP CLEAR NV (SUCTIONS) ×2 IMPLANT
WATER STERILE IRR 1000ML POUR (IV SOLUTION) ×4 IMPLANT

## 2019-12-28 NOTE — Progress Notes (Signed)
Assisted Dr. Annye Asa with left, ultrasound guided, interscalene  block. Side rails up, monitors on throughout procedure. See vital signs in flow sheet. Tolerated Procedure well.

## 2019-12-28 NOTE — Op Note (Signed)
Orthopaedic Surgery Operative Note (CSN: [id number])  CAIA LOFARO  12/14/1945 Date of Surgery: 12/28/2019   Diagnoses:  LEFT PROXIMAL HUMERUS FRACTURE  Procedure: Left reverse total Shoulder Arthroplasty for fracture   Operative Finding Successful completion of planned procedure.  Patient's bone quality was reasonable and we had good fit and rotational control with a longstem flex 3B.  Axillary nerve tug test was intact at the end of the case.  Patient had significant amount of oozing overall however blood loss was not exceptionally high.  Post-operative plan: The patient will be NWB in sling.  The patient will be will be discharged from PACU if continues to be stable as was plan prior to surgery.  DVT prophylaxis Aspirin 81 mg twice daily for 6 weeks.  Pain control with PRN pain medication preferring oral medicines.  Follow up plan will be scheduled in approximately 7 days for incision check and XR.  Physical therapy to start after first 4 weeks.  Implants: 3B flex Tornier stem, 0 high offset tray with a 6 polyethylene, 36 standard glenosphere with a 25 standard baseplate and 35 center screw  Post-Op Diagnosis: Same Surgeons:Primary: Hiram Gash, MD Assistants:Caroline McBane PA-C Location: [facility] ROOM 06 Anesthesia: General with Exparel Interscalene Antibiotics: Ancef 2g preop, Vancomycin 1000mg  locally Tourniquet time: None Estimated Blood [id number] Complications: None Specimens: None Implants: Implant Name Type Inv. Item Serial No. Manufacturer Lot No. LRB No. Used Action  BASEPLATE GLENOSPHERE 70JG STD - [id number] Miscellaneous BASEPLATE GLENOSPHERE 54YT STD [id number] [employer]  Left 1 Implanted  GLENOSPHERE REV SHOULDER 36 - [id number] Joint GLENOSPHERE REV SHOULDER 36 [id number] [employer]  Left 1 Implanted  BONE SCREW THREAD 6.5X35MM - [id number] Screw BONE SCREW THREAD 6.5X35MM  [employer]  Left 1 Implanted  SCREW PERIPHERAL 5.0X34 - [id number] Screw SCREW  PERIPHERAL 5.0X34  [employer]  Left 1 Implanted  SCREW 5.5X22 - [id number] Screw SCREW 5.5X22  [employer]  Left 1 Implanted  INSERT HUMERAL 36X6MM 12.5DEG - [id number] Insert INSERT HUMERAL 36X6MM 12.5DEG [id number] [employer]  Left 1 Implanted  IMPLANT REVERSE SHOULDER 0X3.5 - [id number] Shoulder IMPLANT REVERSE SHOULDER 0X3.[place] [id number] Left 1 Implanted  STEM HUMERAL SZ 3B LONG 98MM - [id number] Stem STEM HUMERAL SZ 3B LONG 98MM [id number] [employer]  Left 1 Implanted    Indications for Surgery:   VANA ARIF is a 74 y.o. female with fall resulting in complex left proximal humerus fracture in a patient who physiologically is older than stated age due to multiple medical comorbidities.  Benefits and risks of operative and nonoperative management were discussed prior to surgery with patient/guardian(s) and informed consent form was completed.  Infection and need for further surgery were discussed as was prosthetic stability and cuff issues.  We additionally specifically discussed risks of axillary nerve injury, infection, periprosthetic fracture, continued pain and longevity of implants prior to beginning procedure.      Procedure:   The patient was identified in the preoperative holding area where the surgical site was marked. Block placed by anesthesia with exparel.  The patient was taken to the OR where a procedural timeout was called and the above noted anesthesia was induced.  The patient was positioned beachchair on allen table with spider arm positioner.  Preoperative antibiotics were dosed.  The patient's left shoulder was prepped and draped in the usual sterile fashion.  A second preoperative timeout was called.       Standard deltopectoral approach was performed with a #10  blade. We dissected down to the subcutaneous tissues and the cephalic vein was taken laterally with the deltoid. Clavipectoral fascia was incised in line with the incision. Deep retractors were  placed. The long of the biceps tendon was identified and there was significant tenosynovitis present.  Tenodesis was performed to the pectoralis tendon with #2 Ethibond. The remaining biceps was followed up into the rotator interval where it was released.   We used the bicipital groove as a landmark for the lesser and greater tuberosity fragments.  We were able to mobilize the lesser tuberosity fragment and placed stay sutures in the bone tendon junction to help with mobilization.  This point we were able to identify the  greater tuberosity fragment and 4 #5  FiberWire sutures were used to place into this for eventual repair of the tuberosities.  Once these were both mobilized we took care to identify the shaft fragment as well as the head fragment.  We carefully identified the head fragment were able to manually remove it.  At this point the axillary nerve was found and palpated and with a tug test noted to be intact.  Protected throughout the remainder of the case with blunt retractors.   We then released the SGHL with bovie cautery prior to placing a curved mayo at the junction of the anterior glenoid well above the axillary nerve and bluntly dissecting the subscapularis from the capsule.  We then carefully protected the axillary nerve as we gently released the inferior capsule to fully mobilize the subscapularis.  An anterior deltoid retractor was then placed as well as a small Hohmann retractor superiorly.  The glenoid was relatively preserved as we would expect in this fracture patient.  The remaining labrum was removed circumferentially taking great care not to disrupt the posterior capsule.   The glenoid drill guide was placed and used to drill a guide pin in the center, inferior position. The glenoid face was then reamed concentrically over the guide wire. The center hole was drilled over the guidepin in a near anatomic angle of version. Next the glenoid vault was drilled back to a depth of 35  mm.  We tapped and then placed a 25mm size baseplate with 0 lateralization was selected with a 35 mm x 6.13mm length central screw.  The base plate was screwed into the glenoid vault obtaining secure fixation. We next placed superior and inferior locking screws for additional fixation.  Next a 36 mm glenosphere was selected and impacted onto the baseplate. The center screw was tightened.  We then repositioned the arm to give access to the humeral shaft fragment.  Drill holes were placed and fiberwire sutures in the shaft for vertical fixation of the tuberosities.  We broached starting with a size one broach and broaching up to 3 long which obtained an appropriate fit above the pec and had appropriate fit which remove the need for cement.   We trialed with multiple size tray and polyethylene options and selected a 0 high which provided good stability and range of motion without excess soft tissue tension. The offset was dialed in to match the normal anatomy. The shoulder was trialed.  There was good ROM in all planes and the shoulder was stable with no inferior translation.  We then mobilized her tuberosities again and placed the anterior deep limbs of the 4 #5 fiber wires around the stem.  1 of these was tied down fixing the greater tuberosity in place after bone graft harvest from the humeral  head component was placed underneath.  A +0 high offset tray was selected and impacted onto the stem.   A 36+6 polyethylene liner was impacted onto the stem.  The joint was reduced and thoroughly irrigated with pulsatile lavage. The remaining sutures were then placed through the subscapularis and the bone tendon junction and the tuberosities were reduced after bone graft placed beneath as autograft at the subscap.  We horizontally secured the tuberosities before placing vertical fixation with the suture that was placed into the shaft.  Tuberosities moved as a unit were happy with her overall reduction.  This was  checked on fluoroscopy confirming our position.  We irrigated copiously at this point.  Hemostasis was obtained. The deltopectoral interval was reapproximated with #1 Ethibond. The subcutaneous tissues were closed with 3-0 Vicryl and the skin was closed with running monocryl.    The wounds were cleaned and dried and an Aquacel dressing was placed. The drapes taken down. The arm was placed into sling with abduction pillow. Patient was awakened, extubated, and transferred to the recovery room in stable condition. There were no intraoperative complications. The sponge, needle, and attention counts were correct at the end of the case.     Noemi Chapel, PA-C, present and scrubbed throughout the case, critical for completion in a timely fashion, and for retraction, instrumentation, closure.

## 2019-12-28 NOTE — Anesthesia Procedure Notes (Signed)
Procedure Name: Intubation Date/Time: 12/28/2019 2:40 PM Performed by: Cynda Familia, CRNA Pre-anesthesia Checklist: Patient identified, Emergency Drugs available, Suction available and Patient being monitored Patient Re-evaluated:Patient Re-evaluated prior to induction Oxygen Delivery Method: Circle System Utilized Preoxygenation: Pre-oxygenation with 100% oxygen Induction Type: IV induction Ventilation: Mask ventilation without difficulty Laryngoscope Size: Miller and 2 Grade View: Grade I Tube type: Oral Number of attempts: 1 Airway Equipment and Method: Stylet and Oral airway Placement Confirmation: ETT inserted through vocal cords under direct vision,  positive ETCO2 and breath sounds checked- equal and bilateral Secured at: 22 cm Tube secured with: Tape Dental Injury: Teeth and Oropharynx as per pre-operative assessment  Comments: IV induction Jackson-- intubation AM CRNA -- DL with pt moving-- blade removed and Sevo on-- OA inserted and assisted ventilation-- OA removed and 2nd DL-- AW poorly visualized edema and excess tissue - consider Glidescope in future-- head and neck neutral-- bruising on left side of face and chest from fall-- bilat BS Glennon Mac

## 2019-12-28 NOTE — Anesthesia Procedure Notes (Signed)
Date/Time: 12/28/2019 4:07 PM Performed by: Cynda Familia, CRNA Oxygen Delivery Method: Simple face mask Placement Confirmation: positive ETCO2 and breath sounds checked- equal and bilateral Dental Injury: Teeth and Oropharynx as per pre-operative assessment

## 2019-12-28 NOTE — H&P (Signed)
PREOPERATIVE H&P  Chief Complaint: LEFT PROXIMAL HUMERUS FRACTURE  HPI: Sarah Miller is a 74 y.o. female who is scheduled for Procedure(s): REVERSE SHOULDER ARTHROPLASTY.   Patient has a past medical history significant for DM, OSA, COPD, CAD.   Patient had a fall on 10/26 when she landed on her left side. [facility] after having x-rays at her PCP. Xrays showed proximal humerus fracture. She was placed in a sling and CT scan was ordered to determine if surgical intervention is necessary.   Her symptoms are rated as moderate to severe, and have been worsening.  This is significantly impairing activities of daily living.    Please see clinic note for further details on this patient's care.    She has elected for surgical management.   Past Medical History:  Diagnosis Date  . Allergic rhinitis   . Anxiety   . Aortic atherosclerosis ([place])   . Cancer [facility])    h/o melanoma '78 and 79  . Cervical arthritis    Dr Delman Kitten, S/P injection with good results.   Marland Kitchen COPD (chronic obstructive pulmonary disease) ([place])    with emphysema  . Coronary artery calcification seen on CAT scan   . Depression    and anxiety  . Diabetes mellitus without complication ([place])   . Early menopause   . Frozen shoulder    H/O frozen shoulder and rotator cuff tendonititis, L s/p injection  . GERD (gastroesophageal reflux disease)   . PONV (postoperative nausea and vomiting)   . Sleep apnea   . Vitamin D deficiency    Past Surgical History:  Procedure Laterality Date  . ABDOMINAL HYSTERECTOMY    . CATARACT EXTRACTION W/ INTRAOCULAR LENS IMPLANT Bilateral   . FRACTURE SURGERY Left    wrist  . MELANOMA EXCISION     Social History   Socioeconomic History  . Marital status: Divorced    Spouse name: Not on file  . Number of children: Not on file  . Years of education: Not on file  . Highest education level: Not on file  Occupational History  . Not on file    Tobacco Use  . Smoking status: Former Smoker    Packs/day: 0.75    Years: 53.00    Pack years: 39.75    Types: Cigarettes    Quit date: 02/27/2016    Years since quitting: 3.8  . Smokeless tobacco: Never Used  Vaping Use  . Vaping Use: Never used  Substance and Sexual Activity  . Alcohol use: Yes    Comment: rare  . Drug use: No  . Sexual activity: Not on file    Comment: Hysterectomy  Other Topics Concern  . Not on file  Social History Narrative  . Not on file   Social Determinants of Health   Financial Resource Strain:   . Difficulty of Paying Living Expenses: Not on file  Food Insecurity:   . Worried About Charity fundraiser in the Last Year: Not on file  . Ran Out of Food in the Last Year: Not on file  Transportation Needs:   . Lack of Transportation (Medical): Not on file  . Lack of Transportation (Non-Medical): Not on file  Physical Activity:   . Days of Exercise per Week: Not on file  . Minutes of Exercise per Session: Not on file  Stress:   . Feeling of Stress : Not on file  Social Connections:   . Frequency  of Communication with Friends and Family: Not on file  . Frequency of Social Gatherings with Friends and Family: Not on file  . Attends Religious Services: Not on file  . Active Member of Clubs or Organizations: Not on file  . Attends Archivist Meetings: Not on file  . Marital Status: Not on file   Family History  Problem Relation Age of Onset  . Cancer Mother        ovarian cancer  . Hypertension Father   . Diabetes Father   . Heart attack Father   . Cancer Father        testicular  . CAD Father   . Heart disease Father   . Hyperlipidemia Father   . Heart attack Brother 28  . CAD Brother   . Heart disease Brother   . CAD Paternal Grandfather   . Hypertension Sister   . Cancer Other   . Hyperlipidemia Other   . Heart disease Other    Allergies  Allergen Reactions  . Morphine And Related Nausea Only  . Other     hydocan  cough syrup causes nausea  . Penicillins Rash   Prior to Admission medications   Medication Sig Start Date End Date Taking? Authorizing Provider  acetaminophen (TYLENOL) 500 MG tablet Take 1,000 mg by mouth every 6 (six) hours as needed for mild pain.   Yes [provider]  ARNUITY ELLIPTA 200 MCG/ACT AEPB Inhale 1 puff into the lungs daily. 11/13/19  Yes [provider]  atorvastatin (LIPITOR) 10 MG tablet Take 10 mg by mouth daily. 09/02/17  Yes [provider]  buPROPion (WELLBUTRIN XL) 300 MG 24 hr tablet Take 300 mg by mouth daily.    Yes [provider]  celecoxib (CELEBREX) 100 MG capsule Take 100 mg by mouth 2 (two) times daily. 12/22/19  Yes [provider]  desoximetasone (TOPICORT) 0.25 % cream Apply 1 application topically 2 (two) times daily. 10/09/19  Yes [provider]  fluticasone (FLONASE) 50 MCG/ACT nasal spray SPRAY 2 SPRAYS INTO EACH NOSTRIL EVERY DAY Patient taking differently: Place 2 sprays into both nostrils daily.  09/21/19  Yes Tanda Rockers, MD  hydrOXYzine (ATARAX/VISTARIL) 25 MG tablet Take 25 mg by mouth daily. 09/28/19  Yes [provider]  Insulin Glargine (BASAGLAR KWIKPEN) 100 UNIT/ML Inject 45 Units into the skin daily. 10/28/19  Yes [provider]  loratadine (CLARITIN) 10 MG tablet Take 10 mg by mouth daily. 11/26/19  Yes [provider]  metFORMIN (GLUCOPHAGE-XR) 500 MG 24 hr tablet Take 500 mg by mouth in the morning and at bedtime. 500 mg in morning and 500 mg at night 10/16/17  Yes [provider]  metoprolol tartrate (LOPRESSOR) 100 MG tablet Take 1 tablet (100 mg total) by mouth 2 (two) times daily. 12/25/19 12/24/20 Yes Kroeger, Daleen Snook M., PA-C  montelukast (SINGULAIR) 10 MG tablet TAKE 1 TABLET BY MOUTH EVERYDAY AT BEDTIME Patient taking differently: Take 10 mg by mouth at bedtime.  11/03/19  Yes Tanda Rockers, MD  nystatin (MYCOSTATIN/NYSTOP) powder Apply 1 application  topically 2 (two) times daily as needed (yeast).  11/27/19  Yes [provider]  sertraline (ZOLOFT) 50 MG tablet Take 50 mg by mouth daily. 11/02/19  Yes [provider]  triamcinolone cream (KENALOG) 0.5 % Apply 1 application topically as directed. 11/26/19  Yes [provider]  ASMANEX HFA 100 MCG/ACT AERO TAKE 2 PUFFS BY MOUTH TWICE A DAY Patient not taking: Reported  on 12/25/2019 05/25/19   Tanda Rockers, MD  Azelastine-Fluticasone 137-50 MCG/ACT SUSP Place 1 puff into the nose 2 (two) times daily. Patient not taking: Reported on 12/25/2019 05/02/18   Martyn Ehrich, NP    ROS: All other systems have been reviewed and were otherwise negative with the exception of those mentioned in the HPI and as above.  Physical Exam: General: Alert, no acute distress Cardiovascular: No pedal edema Respiratory: No cyanosis, no use of accessory musculature GI: No organomegaly, abdomen is soft and non-tender Skin: No lesions in the area of chief complaint Neurologic: Sensation intact distally Psychiatric: Patient is competent for consent with normal mood and affect Lymphatic: No axillary or cervical lymphadenopathy  MUSCULOSKELETAL:  Left upper extremity: TTP proximal humerus. ROM not tested in setting of known fracture. NVI  Imaging: CT showing proximal humerus fracture  Assessment: LEFT PROXIMAL HUMERUS FRACTURE  Plan: Plan for Procedure(s): REVERSE SHOULDER ARTHROPLASTY  The risks benefits and alternatives were discussed with the patient including but not limited to the risks of nonoperative treatment, versus surgical intervention including infection, bleeding, nerve injury,  blood clots, cardiopulmonary complications, morbidity, mortality, among others, and they were willing to proceed.   We additionally specifically discussed risks of axillary nerve injury, infection, periprosthetic fracture, continued pain and longevity of implants prior to beginning procedure.      Patient will be closely monitored in PACU for medical stabilization and pain control. If found stable in PACU, patient may be discharged home with outpatient follow-up. If any concerns regarding patient's stabilization patient will be admitted for observation after surgery. The patient is planning to be discharged home with outpatient PT.   The patient acknowledged the explanation, agreed to proceed with the plan and consent was signed.   Operative Plan: Left reverse total shoulder arthroplasty Discharge Medications: Tylenol, Celebrex, Oxycodone, Zofran DVT Prophylaxis: Aspirin Physical Therapy: Outpatient PT Special Discharge needs: Sling, Ice machine   Ethelda Chick, PA-C  12/28/2019 1:39 PM

## 2019-12-28 NOTE — Transfer of Care (Signed)
Immediate Anesthesia Transfer of Care Note  Patient: Sarah Miller  Procedure(s) Performed: REVERSE SHOULDER ARTHROPLASTY (Left Shoulder)  Patient Location: PACU  Anesthesia Type:General  Level of Consciousness: awake and alert   Airway & Oxygen Therapy: Patient Spontanous Breathing and Patient connected to face mask oxygen  Post-op Assessment: Report given to RN and Post -op Vital signs reviewed and stable  Post vital signs: Reviewed and stable  Last Vitals:  Vitals Value Taken Time  BP    Temp    Pulse    Resp    SpO2      Last Pain:  Vitals:   12/28/19 1243  TempSrc: Oral  PainSc: 10-Worst pain ever         Complications: No complications documented.

## 2019-12-28 NOTE — Anesthesia Postprocedure Evaluation (Signed)
Anesthesia Post Note  Patient: Sarah Miller  Procedure(s) Performed: REVERSE SHOULDER ARTHROPLASTY (Left Shoulder)     Patient location during evaluation: PACU Anesthesia Type: General and Regional Level of consciousness: awake and alert, patient cooperative and oriented Pain management: pain level controlled Vital Signs Assessment: post-procedure vital signs reviewed and stable Respiratory status: spontaneous breathing, nonlabored ventilation and respiratory function stable Cardiovascular status: blood pressure returned to baseline and stable Postop Assessment: no apparent nausea or vomiting, adequate PO intake and able to ambulate Anesthetic complications: no   No complications documented.  Last Vitals:  Vitals:   12/28/19 1700 12/28/19 1715  BP: 134/82 126/64  Pulse: 70 80  Resp: 16 19  Temp:  (!) 36.3 C  SpO2: 100% 92%    Last Pain:  Vitals:   12/28/19 1715  TempSrc:   PainSc: 0-No pain                 Teddrick Mallari,E. Hammond Obeirne

## 2019-12-28 NOTE — Interval H&P Note (Signed)
History and Physical Interval Note:  12/28/2019 2:19 PM  Sarah Miller  has presented today for surgery, with the diagnosis of LEFT PROXIMAL HUMERUS FRACTURE.  The various methods of treatment have been discussed with the patient and family. After consideration of risks, benefits and other options for treatment, the patient has consented to  Procedure(s): REVERSE SHOULDER ARTHROPLASTY (Left) as a surgical intervention.  The patient's history has been reviewed, patient examined, no change in status, stable for surgery.  I have reviewed the patient's chart and labs.  Questions were answered to the patient's satisfaction.     Hiram Gash

## 2019-12-28 NOTE — Anesthesia Procedure Notes (Signed)
Anesthesia Regional Block: Interscalene brachial plexus block   Pre-Anesthetic Checklist: ,, timeout performed, Correct Patient, Correct Site, Correct Laterality, Correct Procedure, Correct Position, site marked, Risks and benefits discussed,  Surgical consent,  Pre-op evaluation,  At surgeon's request and post-op pain management  Laterality: Left and Upper  Prep: chloraprep       Needles:  Injection technique: Single-shot  Needle Type: Echogenic Needle     Needle Length: 9cm  Needle Gauge: 21     Additional Needles:   Procedures:,,,, ultrasound used (permanent image in chart),,,,  Narrative:  Start time: 12/28/2019 1:40 PM End time: 12/28/2019 1:46 PM Injection made incrementally with aspirations every 5 mL.  Performed by: Personally  Anesthesiologist: Annye Asa, MD  Additional Notes: Pt identified in Holding room.  Monitors applied. Working IV access confirmed. Sterile prep L clavicle and neck.  #21ga ECHOgenic Arrow block needle to interscalene brachial plexus with US guidance.  10cc 0.5% Bupivacaine with 1:200k epi and Exparel injected incrementally after negative test dose.  Patient asymptomatic, VSS, no heme aspirated, tolerated well.  Jenita Seashore, MD

## 2019-12-29 ENCOUNTER — Encounter (HOSPITAL_COMMUNITY): Payer: Self-pay | Admitting: Orthopaedic Surgery

## 2020-01-05 DIAGNOSIS — S42202D Unspecified fracture of upper end of left humerus, subsequent encounter for fracture with routine healing: Secondary | ICD-10-CM | POA: Diagnosis not present

## 2020-01-26 DIAGNOSIS — M25512 Pain in left shoulder: Secondary | ICD-10-CM | POA: Diagnosis not present

## 2020-01-28 NOTE — Telephone Encounter (Signed)
Left message for patient to call back and schedule office visit to discuss CTA results with DR. Turner or an APP.

## 2020-02-04 ENCOUNTER — Ambulatory Visit: Payer: Medicare Other | Admitting: Internal Medicine

## 2020-02-04 NOTE — Progress Notes (Deleted)
Sarah Miller, female    DOB: 07/29/1945,   MRN: [id number]   Brief patient profile:  81 yowf quit smoking 02/2016 with pattern of violent sneezing going back to childhood and never had any lower resp problems but as an adult noted pattern of "bronchitis" ? Better p quit smoking but really sick in Sept 2019 > ER with midline cp better p rx omeprazole which seemed to help the cp but not the cough which persisted daily since = minimally productive more when lie down but present  24/7 and evolves sometimes into severe  choking spells  With w/u and Rx by Dr Ander Slade and sought second opinion so referred to me 04/17/2018.  Prev eval: rx Breo 200 variably reports "better right after take it because it helps me cough up stuff  ? How much?  = just a speck ? Better on prednisone but made sugars go up and losing wt with overt hyperglycemia and marked elevation of hgbA1c singulair addied 04/08/18     History of Present Illness  04/17/2018  Pulmonary/ 1st office eval/Makinzey Banes  Chief Complaint  Patient presents with  . Follow-up    Second opinion on cough per Dr Ander Slade. Pt c/o cough since September 2019. Cough is occ prod with clear to yellow sputum.   Dyspnea:  Minimally doe Unless coughing    Cough: minimally productive no worse in am/ cough to point of gag  Sleep: sleeps on side / cough some but no gagging nocturnally  SABA use: no better with saba  Takes ppi with bfast, using mints/ cough drops  REC Ompeprazole 40 mg Take 30- 60 min before your first and last meals of the day  Stop Breo  GERD  Finish your prednisone  Take delsym two tsp every 12 hours and supplement if needed with  tramadol 50 mg up to 1 every 4 hours to suppress the urge to cough.     05/02/2018 Adding Asmanex 100 - take 2 puffs twice daily until next follow-up  Adding Dymista (Fluticasone + Azelastine) nasal spray 1 puff per nostril twice daily - if too expensive ask pharmacy to help you get equivalent  Continue Omeprazole  twice daily for GERD symptoms, if EGD was normal and you do not have heart burn symptoms ok to stop Recommendations: gerd diet  - Take Delsym two tsp every 12 hours with tramadol 50 mg up to 1 every 4 hours to suppress the urge to cough - Once you have eliminated the cough for 3 straight days try reducing the tramadol first and then the delsym as tolerated     02/02/2019  f/u ov/Tivon Lemoine re: doe ? Better p saba / maint on asmanex 100 Chief Complaint  Patient presents with  . Follow-up    review PFT.  pt states she is doing well, notes occasional PND.     Dyspnea: mailbox and back ok is 75 and slt hill Cough: none / maint off gerd rx  Sleeping: on side , bed is flat, on cpap  SABA use: avg twice daily  02: none  rec Try albuterol 15 min before an activity that you know would make you short of breath and see if it makes any difference and if makes none then don't take it after activity unless you can't catch your breath. If you find that using albuterol (Ventolin) really helps you with exertion  (vs the same activity without the ventolin) call us for a prescription for Dulera  100 2 every 12  hours     08/05/2019  f/u ov/Ciera Beckum re:  ? Cough variant asthma vs uacs 2nd vaccine 05/13/19  Chief Complaint  Patient presents with  . Follow-up  Dyspnea:  Still doe x mailbox and slt uphill back top hours stops  one half way s checking sats and never prechallenges with saba as rec above  Cough: not much  Sleeping: on cpap and bed is flat  SABA use: you said to stop using  02: none  rec Try albuterol 15 min before an activity that you know would make you short of breath and see if it makes any difference and if makes none then don't take it after activity unless you can't catch your breath. If you find that using albuterol (Ventolin) really helps you with exertion  (vs the same activity without the ventolin) call us for a prescription for Dulera  100 2 every 12 hours  ( or formulary preferred alternative  like symbicort 80)     02/04/2020  f/u ov/Shyenne Maggard re:  Cough variant asthma vs uacs No chief complaint on file.    Dyspnea:  *** Cough: *** Sleeping: *** SABA use: *** 02: ***   No obvious day to day or daytime variability or assoc excess/ purulent sputum or mucus plugs or hemoptysis or cp or chest tightness, subjective wheeze or overt sinus or hb symptoms.   *** without nocturnal  or early am exacerbation  of respiratory  c/o's or need for noct saba. Also denies any obvious fluctuation of symptoms with weather or environmental changes or other aggravating or alleviating factors except as outlined above   No unusual exposure hx or h/o childhood pna/ asthma or knowledge of premature birth.  Current Allergies, Complete Past Medical History, Past Surgical History, Family History, and Social History were reviewed in [employer] record.  ROS  The following are not active complaints unless bolded Hoarseness, sore throat, dysphagia, dental problems, itching, sneezing,  nasal congestion or discharge of excess mucus or purulent secretions, ear ache,   fever, chills, sweats, unintended wt loss or wt gain, classically pleuritic or exertional cp,  orthopnea pnd or arm/hand swelling  or leg swelling, presyncope, palpitations, abdominal pain, anorexia, nausea, vomiting, diarrhea  or change in bowel habits or change in bladder habits, change in stools or change in urine, dysuria, hematuria,  rash, arthralgias, visual complaints, headache, numbness, weakness or ataxia or problems with walking or coordination,  change in mood or  memory.        No outpatient medications have been marked as taking for the 02/04/20 encounter (Appointment) with Tanda Rockers, MD.             Past Medical History:  Diagnosis Date  . Allergic rhinitis   . Cancer [facility])    h/o melanoma '78 and 79  . Cervical arthritis    Dr Delman Kitten, S/P injection with good results.   . Depression     and anxiety  . Diabetes mellitus without complication ([place])   . Early menopause   . Frozen shoulder    H/O frozen shoulder and rotator cuff tendonititis, L s/p injection  . Sleep apnea   . Vitamin D deficiency        Objective:       02/04/2020       ***  08/05/2019        239  02/02/19 227 lb 9.6 oz (103.2 kg)  05/02/18 206 lb (93.4 kg)  04/17/18 203 lb (92.1  kg)    Vital signs reviewed  02/04/2020  - Note at rest 02 sats  ***% on ***              Assessment

## 2020-02-08 ENCOUNTER — Other Ambulatory Visit: Payer: Self-pay | Admitting: Internal Medicine

## 2020-02-12 DIAGNOSIS — M25512 Pain in left shoulder: Secondary | ICD-10-CM | POA: Diagnosis not present

## 2020-02-17 DIAGNOSIS — M25512 Pain in left shoulder: Secondary | ICD-10-CM | POA: Diagnosis not present

## 2020-03-17 ENCOUNTER — Other Ambulatory Visit: Payer: Self-pay | Admitting: *Deleted

## 2020-03-17 MED ORDER — FLUTICASONE PROPIONATE 50 MCG/ACT NA SUSP: NASAL | 1 refills | Status: AC

## 2020-03-25 DIAGNOSIS — M25512 Pain in left shoulder: Secondary | ICD-10-CM | POA: Diagnosis not present

## 2020-04-15 DIAGNOSIS — R29818 Other symptoms and signs involving the nervous system: Secondary | ICD-10-CM | POA: Diagnosis not present

## 2020-04-15 DIAGNOSIS — G4733 Obstructive sleep apnea (adult) (pediatric): Secondary | ICD-10-CM | POA: Diagnosis not present

## 2020-04-15 DIAGNOSIS — D649 Anemia, unspecified: Secondary | ICD-10-CM | POA: Diagnosis not present

## 2020-04-15 DIAGNOSIS — E1169 Type 2 diabetes mellitus with other specified complication: Secondary | ICD-10-CM | POA: Diagnosis not present

## 2020-04-15 DIAGNOSIS — E559 Vitamin D deficiency, unspecified: Secondary | ICD-10-CM | POA: Diagnosis not present

## 2020-04-15 DIAGNOSIS — R5383 Other fatigue: Secondary | ICD-10-CM | POA: Diagnosis not present

## 2020-04-15 DIAGNOSIS — F3341 Major depressive disorder, recurrent, in partial remission: Secondary | ICD-10-CM | POA: Diagnosis not present

## 2020-04-15 DIAGNOSIS — J439 Emphysema, unspecified: Secondary | ICD-10-CM | POA: Diagnosis not present

## 2020-04-15 DIAGNOSIS — I10 Essential (primary) hypertension: Secondary | ICD-10-CM | POA: Diagnosis not present

## 2020-05-10 DIAGNOSIS — Z791 Long term (current) use of non-steroidal anti-inflammatories (NSAID): Secondary | ICD-10-CM | POA: Diagnosis not present

## 2020-05-10 DIAGNOSIS — D509 Iron deficiency anemia, unspecified: Secondary | ICD-10-CM | POA: Diagnosis not present

## 2020-05-20 DIAGNOSIS — F3341 Major depressive disorder, recurrent, in partial remission: Secondary | ICD-10-CM | POA: Diagnosis not present

## 2020-05-20 DIAGNOSIS — I10 Essential (primary) hypertension: Secondary | ICD-10-CM | POA: Diagnosis not present

## 2020-05-20 DIAGNOSIS — D509 Iron deficiency anemia, unspecified: Secondary | ICD-10-CM | POA: Diagnosis not present

## 2020-05-20 DIAGNOSIS — R609 Edema, unspecified: Secondary | ICD-10-CM | POA: Diagnosis not present

## 2020-05-20 DIAGNOSIS — I872 Venous insufficiency (chronic) (peripheral): Secondary | ICD-10-CM | POA: Diagnosis not present

## 2020-07-12 DIAGNOSIS — M25512 Pain in left shoulder: Secondary | ICD-10-CM | POA: Diagnosis not present

## 2020-07-12 DIAGNOSIS — R269 Unspecified abnormalities of gait and mobility: Secondary | ICD-10-CM | POA: Diagnosis not present

## 2020-07-14 DIAGNOSIS — R269 Unspecified abnormalities of gait and mobility: Secondary | ICD-10-CM | POA: Diagnosis not present

## 2020-07-14 DIAGNOSIS — M25512 Pain in left shoulder: Secondary | ICD-10-CM | POA: Diagnosis not present

## 2020-07-20 DIAGNOSIS — M25512 Pain in left shoulder: Secondary | ICD-10-CM | POA: Diagnosis not present

## 2020-07-20 DIAGNOSIS — R269 Unspecified abnormalities of gait and mobility: Secondary | ICD-10-CM | POA: Diagnosis not present

## 2020-07-26 DIAGNOSIS — K2930 Chronic superficial gastritis without bleeding: Secondary | ICD-10-CM | POA: Diagnosis not present

## 2020-07-26 DIAGNOSIS — D509 Iron deficiency anemia, unspecified: Secondary | ICD-10-CM | POA: Diagnosis not present

## 2020-07-26 DIAGNOSIS — D123 Benign neoplasm of transverse colon: Secondary | ICD-10-CM | POA: Diagnosis not present

## 2020-07-26 DIAGNOSIS — K648 Other hemorrhoids: Secondary | ICD-10-CM | POA: Diagnosis not present

## 2020-07-26 DIAGNOSIS — K5730 Diverticulosis of large intestine without perforation or abscess without bleeding: Secondary | ICD-10-CM | POA: Diagnosis not present

## 2020-07-26 DIAGNOSIS — K635 Polyp of colon: Secondary | ICD-10-CM | POA: Diagnosis not present

## 2020-07-27 DIAGNOSIS — M25512 Pain in left shoulder: Secondary | ICD-10-CM | POA: Diagnosis not present

## 2020-07-27 DIAGNOSIS — R269 Unspecified abnormalities of gait and mobility: Secondary | ICD-10-CM | POA: Diagnosis not present

## 2020-07-29 DIAGNOSIS — R269 Unspecified abnormalities of gait and mobility: Secondary | ICD-10-CM | POA: Diagnosis not present

## 2020-07-29 DIAGNOSIS — M25512 Pain in left shoulder: Secondary | ICD-10-CM | POA: Diagnosis not present

## 2020-08-02 DIAGNOSIS — D123 Benign neoplasm of transverse colon: Secondary | ICD-10-CM | POA: Diagnosis not present

## 2020-08-02 DIAGNOSIS — K2930 Chronic superficial gastritis without bleeding: Secondary | ICD-10-CM | POA: Diagnosis not present

## 2020-08-02 DIAGNOSIS — K635 Polyp of colon: Secondary | ICD-10-CM | POA: Diagnosis not present

## 2020-08-05 DIAGNOSIS — M25512 Pain in left shoulder: Secondary | ICD-10-CM | POA: Diagnosis not present

## 2020-08-05 DIAGNOSIS — R269 Unspecified abnormalities of gait and mobility: Secondary | ICD-10-CM | POA: Diagnosis not present

## 2020-08-09 DIAGNOSIS — R269 Unspecified abnormalities of gait and mobility: Secondary | ICD-10-CM | POA: Diagnosis not present

## 2020-08-09 DIAGNOSIS — M25512 Pain in left shoulder: Secondary | ICD-10-CM | POA: Diagnosis not present

## 2020-08-23 DIAGNOSIS — G4733 Obstructive sleep apnea (adult) (pediatric): Secondary | ICD-10-CM | POA: Diagnosis not present

## 2020-08-23 DIAGNOSIS — J439 Emphysema, unspecified: Secondary | ICD-10-CM | POA: Diagnosis not present

## 2020-08-23 DIAGNOSIS — I10 Essential (primary) hypertension: Secondary | ICD-10-CM | POA: Diagnosis not present

## 2020-08-23 DIAGNOSIS — Z7984 Long term (current) use of oral hypoglycemic drugs: Secondary | ICD-10-CM | POA: Diagnosis not present

## 2020-08-23 DIAGNOSIS — E113293 Type 2 diabetes mellitus with mild nonproliferative diabetic retinopathy without macular edema, bilateral: Secondary | ICD-10-CM | POA: Diagnosis not present

## 2020-08-23 DIAGNOSIS — E785 Hyperlipidemia, unspecified: Secondary | ICD-10-CM | POA: Diagnosis not present

## 2020-08-23 DIAGNOSIS — I700 Atherosclerosis of aorta: Secondary | ICD-10-CM | POA: Diagnosis not present

## 2020-08-23 DIAGNOSIS — Z0000 Encounter for general adult medical examination without abnormal findings: Secondary | ICD-10-CM | POA: Diagnosis not present

## 2020-08-23 DIAGNOSIS — Z1389 Encounter for screening for other disorder: Secondary | ICD-10-CM | POA: Diagnosis not present

## 2020-08-23 DIAGNOSIS — Z1159 Encounter for screening for other viral diseases: Secondary | ICD-10-CM | POA: Diagnosis not present

## 2020-08-23 DIAGNOSIS — E559 Vitamin D deficiency, unspecified: Secondary | ICD-10-CM | POA: Diagnosis not present

## 2020-08-23 DIAGNOSIS — D509 Iron deficiency anemia, unspecified: Secondary | ICD-10-CM | POA: Diagnosis not present

## 2020-08-23 DIAGNOSIS — I2510 Atherosclerotic heart disease of native coronary artery without angina pectoris: Secondary | ICD-10-CM | POA: Diagnosis not present

## 2020-08-23 DIAGNOSIS — E1169 Type 2 diabetes mellitus with other specified complication: Secondary | ICD-10-CM | POA: Diagnosis not present

## 2020-08-23 DIAGNOSIS — J309 Allergic rhinitis, unspecified: Secondary | ICD-10-CM | POA: Diagnosis not present

## 2020-09-06 DIAGNOSIS — M25562 Pain in left knee: Secondary | ICD-10-CM | POA: Diagnosis not present

## 2020-09-10 DIAGNOSIS — M25562 Pain in left knee: Secondary | ICD-10-CM | POA: Diagnosis not present

## 2020-09-27 DIAGNOSIS — M25562 Pain in left knee: Secondary | ICD-10-CM | POA: Diagnosis not present

## 2020-09-29 DIAGNOSIS — R269 Unspecified abnormalities of gait and mobility: Secondary | ICD-10-CM | POA: Diagnosis not present

## 2020-09-29 DIAGNOSIS — M25562 Pain in left knee: Secondary | ICD-10-CM | POA: Diagnosis not present

## 2020-09-29 DIAGNOSIS — M25512 Pain in left shoulder: Secondary | ICD-10-CM | POA: Diagnosis not present

## 2020-10-11 DIAGNOSIS — R269 Unspecified abnormalities of gait and mobility: Secondary | ICD-10-CM | POA: Diagnosis not present

## 2020-10-11 DIAGNOSIS — M25562 Pain in left knee: Secondary | ICD-10-CM | POA: Diagnosis not present

## 2020-10-11 DIAGNOSIS — M25512 Pain in left shoulder: Secondary | ICD-10-CM | POA: Diagnosis not present

## 2020-10-12 DIAGNOSIS — M25512 Pain in left shoulder: Secondary | ICD-10-CM | POA: Diagnosis not present

## 2020-10-12 DIAGNOSIS — R269 Unspecified abnormalities of gait and mobility: Secondary | ICD-10-CM | POA: Diagnosis not present

## 2020-10-12 DIAGNOSIS — M25562 Pain in left knee: Secondary | ICD-10-CM | POA: Diagnosis not present

## 2020-10-18 DIAGNOSIS — M25562 Pain in left knee: Secondary | ICD-10-CM | POA: Diagnosis not present

## 2020-10-18 DIAGNOSIS — R269 Unspecified abnormalities of gait and mobility: Secondary | ICD-10-CM | POA: Diagnosis not present

## 2020-10-18 DIAGNOSIS — M25512 Pain in left shoulder: Secondary | ICD-10-CM | POA: Diagnosis not present

## 2020-10-19 DIAGNOSIS — M25562 Pain in left knee: Secondary | ICD-10-CM | POA: Diagnosis not present

## 2020-10-19 DIAGNOSIS — R269 Unspecified abnormalities of gait and mobility: Secondary | ICD-10-CM | POA: Diagnosis not present

## 2020-10-19 DIAGNOSIS — M25512 Pain in left shoulder: Secondary | ICD-10-CM | POA: Diagnosis not present

## 2020-10-25 DIAGNOSIS — M25512 Pain in left shoulder: Secondary | ICD-10-CM | POA: Diagnosis not present

## 2020-10-25 DIAGNOSIS — M25562 Pain in left knee: Secondary | ICD-10-CM | POA: Diagnosis not present

## 2020-10-25 DIAGNOSIS — R269 Unspecified abnormalities of gait and mobility: Secondary | ICD-10-CM | POA: Diagnosis not present

## 2020-10-26 DIAGNOSIS — M25512 Pain in left shoulder: Secondary | ICD-10-CM | POA: Diagnosis not present

## 2020-10-26 DIAGNOSIS — M25562 Pain in left knee: Secondary | ICD-10-CM | POA: Diagnosis not present

## 2020-10-26 DIAGNOSIS — R269 Unspecified abnormalities of gait and mobility: Secondary | ICD-10-CM | POA: Diagnosis not present

## 2020-11-02 DIAGNOSIS — R269 Unspecified abnormalities of gait and mobility: Secondary | ICD-10-CM | POA: Diagnosis not present

## 2020-11-02 DIAGNOSIS — M25512 Pain in left shoulder: Secondary | ICD-10-CM | POA: Diagnosis not present

## 2020-11-02 DIAGNOSIS — M25562 Pain in left knee: Secondary | ICD-10-CM | POA: Diagnosis not present

## 2020-11-09 DIAGNOSIS — M25512 Pain in left shoulder: Secondary | ICD-10-CM | POA: Diagnosis not present

## 2020-11-09 DIAGNOSIS — R269 Unspecified abnormalities of gait and mobility: Secondary | ICD-10-CM | POA: Diagnosis not present

## 2020-11-09 DIAGNOSIS — M25562 Pain in left knee: Secondary | ICD-10-CM | POA: Diagnosis not present

## 2020-11-16 DIAGNOSIS — M25512 Pain in left shoulder: Secondary | ICD-10-CM | POA: Diagnosis not present

## 2020-11-16 DIAGNOSIS — M25562 Pain in left knee: Secondary | ICD-10-CM | POA: Diagnosis not present

## 2020-11-16 DIAGNOSIS — R269 Unspecified abnormalities of gait and mobility: Secondary | ICD-10-CM | POA: Diagnosis not present

## 2020-11-18 DIAGNOSIS — Z23 Encounter for immunization: Secondary | ICD-10-CM | POA: Diagnosis not present

## 2020-11-22 DIAGNOSIS — Z1231 Encounter for screening mammogram for malignant neoplasm of breast: Secondary | ICD-10-CM | POA: Diagnosis not present

## 2020-11-22 DIAGNOSIS — Z780 Asymptomatic menopausal state: Secondary | ICD-10-CM | POA: Diagnosis not present

## 2020-11-22 DIAGNOSIS — E119 Type 2 diabetes mellitus without complications: Secondary | ICD-10-CM | POA: Diagnosis not present

## 2020-11-22 DIAGNOSIS — M8589 Other specified disorders of bone density and structure, multiple sites: Secondary | ICD-10-CM | POA: Diagnosis not present

## 2020-12-07 DIAGNOSIS — R921 Mammographic calcification found on diagnostic imaging of breast: Secondary | ICD-10-CM | POA: Diagnosis not present

## 2020-12-16 DIAGNOSIS — E559 Vitamin D deficiency, unspecified: Secondary | ICD-10-CM | POA: Diagnosis not present

## 2020-12-16 DIAGNOSIS — E785 Hyperlipidemia, unspecified: Secondary | ICD-10-CM | POA: Diagnosis not present

## 2020-12-16 DIAGNOSIS — E1169 Type 2 diabetes mellitus with other specified complication: Secondary | ICD-10-CM | POA: Diagnosis not present

## 2020-12-16 DIAGNOSIS — K219 Gastro-esophageal reflux disease without esophagitis: Secondary | ICD-10-CM | POA: Diagnosis not present

## 2020-12-16 DIAGNOSIS — Z23 Encounter for immunization: Secondary | ICD-10-CM | POA: Diagnosis not present

## 2020-12-16 DIAGNOSIS — J309 Allergic rhinitis, unspecified: Secondary | ICD-10-CM | POA: Diagnosis not present

## 2020-12-16 DIAGNOSIS — F3341 Major depressive disorder, recurrent, in partial remission: Secondary | ICD-10-CM | POA: Diagnosis not present

## 2020-12-16 DIAGNOSIS — D509 Iron deficiency anemia, unspecified: Secondary | ICD-10-CM | POA: Diagnosis not present

## 2020-12-16 DIAGNOSIS — J439 Emphysema, unspecified: Secondary | ICD-10-CM | POA: Diagnosis not present

## 2020-12-16 DIAGNOSIS — I2510 Atherosclerotic heart disease of native coronary artery without angina pectoris: Secondary | ICD-10-CM | POA: Diagnosis not present

## 2020-12-16 DIAGNOSIS — I10 Essential (primary) hypertension: Secondary | ICD-10-CM | POA: Diagnosis not present

## 2020-12-16 DIAGNOSIS — Z7984 Long term (current) use of oral hypoglycemic drugs: Secondary | ICD-10-CM | POA: Diagnosis not present

## 2020-12-23 DIAGNOSIS — M25562 Pain in left knee: Secondary | ICD-10-CM | POA: Diagnosis not present

## 2020-12-23 DIAGNOSIS — R269 Unspecified abnormalities of gait and mobility: Secondary | ICD-10-CM | POA: Diagnosis not present

## 2020-12-23 DIAGNOSIS — M25512 Pain in left shoulder: Secondary | ICD-10-CM | POA: Diagnosis not present

## 2021-01-02 DIAGNOSIS — M25512 Pain in left shoulder: Secondary | ICD-10-CM | POA: Diagnosis not present

## 2021-01-02 DIAGNOSIS — R269 Unspecified abnormalities of gait and mobility: Secondary | ICD-10-CM | POA: Diagnosis not present

## 2021-01-02 DIAGNOSIS — M25562 Pain in left knee: Secondary | ICD-10-CM | POA: Diagnosis not present

## 2021-01-04 DIAGNOSIS — R269 Unspecified abnormalities of gait and mobility: Secondary | ICD-10-CM | POA: Diagnosis not present

## 2021-01-04 DIAGNOSIS — M25562 Pain in left knee: Secondary | ICD-10-CM | POA: Diagnosis not present

## 2021-01-04 DIAGNOSIS — M25512 Pain in left shoulder: Secondary | ICD-10-CM | POA: Diagnosis not present

## 2021-01-11 DIAGNOSIS — M25562 Pain in left knee: Secondary | ICD-10-CM | POA: Diagnosis not present

## 2021-01-11 DIAGNOSIS — R269 Unspecified abnormalities of gait and mobility: Secondary | ICD-10-CM | POA: Diagnosis not present

## 2021-01-11 DIAGNOSIS — M25512 Pain in left shoulder: Secondary | ICD-10-CM | POA: Diagnosis not present

## 2021-05-26 DIAGNOSIS — I10 Essential (primary) hypertension: Secondary | ICD-10-CM | POA: Diagnosis not present

## 2021-05-26 DIAGNOSIS — F3341 Major depressive disorder, recurrent, in partial remission: Secondary | ICD-10-CM | POA: Diagnosis not present

## 2021-05-26 DIAGNOSIS — D509 Iron deficiency anemia, unspecified: Secondary | ICD-10-CM | POA: Diagnosis not present

## 2021-05-26 DIAGNOSIS — Z87828 Personal history of other (healed) physical injury and trauma: Secondary | ICD-10-CM | POA: Diagnosis not present

## 2021-05-26 DIAGNOSIS — I700 Atherosclerosis of aorta: Secondary | ICD-10-CM | POA: Diagnosis not present

## 2021-05-26 DIAGNOSIS — R296 Repeated falls: Secondary | ICD-10-CM | POA: Diagnosis not present

## 2021-05-26 DIAGNOSIS — E1169 Type 2 diabetes mellitus with other specified complication: Secondary | ICD-10-CM | POA: Diagnosis not present

## 2021-05-26 DIAGNOSIS — E785 Hyperlipidemia, unspecified: Secondary | ICD-10-CM | POA: Diagnosis not present

## 2021-05-26 DIAGNOSIS — F419 Anxiety disorder, unspecified: Secondary | ICD-10-CM | POA: Diagnosis not present

## 2021-05-26 DIAGNOSIS — J439 Emphysema, unspecified: Secondary | ICD-10-CM | POA: Diagnosis not present

## 2021-05-26 DIAGNOSIS — I2510 Atherosclerotic heart disease of native coronary artery without angina pectoris: Secondary | ICD-10-CM | POA: Diagnosis not present

## 2021-05-26 DIAGNOSIS — J309 Allergic rhinitis, unspecified: Secondary | ICD-10-CM | POA: Diagnosis not present

## 2021-06-06 DIAGNOSIS — D509 Iron deficiency anemia, unspecified: Secondary | ICD-10-CM | POA: Diagnosis not present

## 2021-06-06 DIAGNOSIS — R296 Repeated falls: Secondary | ICD-10-CM | POA: Diagnosis not present

## 2021-06-06 DIAGNOSIS — I872 Venous insufficiency (chronic) (peripheral): Secondary | ICD-10-CM | POA: Diagnosis not present

## 2021-06-06 DIAGNOSIS — J309 Allergic rhinitis, unspecified: Secondary | ICD-10-CM | POA: Diagnosis not present

## 2021-06-06 DIAGNOSIS — E559 Vitamin D deficiency, unspecified: Secondary | ICD-10-CM | POA: Diagnosis not present

## 2021-06-06 DIAGNOSIS — N3281 Overactive bladder: Secondary | ICD-10-CM | POA: Diagnosis not present

## 2021-06-06 DIAGNOSIS — M25562 Pain in left knee: Secondary | ICD-10-CM | POA: Diagnosis not present

## 2021-06-06 DIAGNOSIS — F3341 Major depressive disorder, recurrent, in partial remission: Secondary | ICD-10-CM | POA: Diagnosis not present

## 2021-06-06 DIAGNOSIS — F419 Anxiety disorder, unspecified: Secondary | ICD-10-CM | POA: Diagnosis not present

## 2021-06-06 DIAGNOSIS — K760 Fatty (change of) liver, not elsewhere classified: Secondary | ICD-10-CM | POA: Diagnosis not present

## 2021-06-06 DIAGNOSIS — R2689 Other abnormalities of gait and mobility: Secondary | ICD-10-CM | POA: Diagnosis not present

## 2021-06-06 DIAGNOSIS — I2510 Atherosclerotic heart disease of native coronary artery without angina pectoris: Secondary | ICD-10-CM | POA: Diagnosis not present

## 2021-06-06 DIAGNOSIS — S83242D Other tear of medial meniscus, current injury, left knee, subsequent encounter: Secondary | ICD-10-CM | POA: Diagnosis not present

## 2021-06-06 DIAGNOSIS — I4510 Unspecified right bundle-branch block: Secondary | ICD-10-CM | POA: Diagnosis not present

## 2021-06-06 DIAGNOSIS — E113293 Type 2 diabetes mellitus with mild nonproliferative diabetic retinopathy without macular edema, bilateral: Secondary | ICD-10-CM | POA: Diagnosis not present

## 2021-06-06 DIAGNOSIS — I700 Atherosclerosis of aorta: Secondary | ICD-10-CM | POA: Diagnosis not present

## 2021-06-06 DIAGNOSIS — G4733 Obstructive sleep apnea (adult) (pediatric): Secondary | ICD-10-CM | POA: Diagnosis not present

## 2021-06-06 DIAGNOSIS — J439 Emphysema, unspecified: Secondary | ICD-10-CM | POA: Diagnosis not present

## 2021-06-06 DIAGNOSIS — E785 Hyperlipidemia, unspecified: Secondary | ICD-10-CM | POA: Diagnosis not present

## 2021-06-06 DIAGNOSIS — M8580 Other specified disorders of bone density and structure, unspecified site: Secondary | ICD-10-CM | POA: Diagnosis not present

## 2021-06-06 DIAGNOSIS — N393 Stress incontinence (female) (male): Secondary | ICD-10-CM | POA: Diagnosis not present

## 2021-06-06 DIAGNOSIS — M4696 Unspecified inflammatory spondylopathy, lumbar region: Secondary | ICD-10-CM | POA: Diagnosis not present

## 2021-06-06 DIAGNOSIS — K5730 Diverticulosis of large intestine without perforation or abscess without bleeding: Secondary | ICD-10-CM | POA: Diagnosis not present

## 2021-06-06 DIAGNOSIS — I10 Essential (primary) hypertension: Secondary | ICD-10-CM | POA: Diagnosis not present

## 2021-06-08 DIAGNOSIS — S83242D Other tear of medial meniscus, current injury, left knee, subsequent encounter: Secondary | ICD-10-CM | POA: Diagnosis not present

## 2021-06-08 DIAGNOSIS — I10 Essential (primary) hypertension: Secondary | ICD-10-CM | POA: Diagnosis not present

## 2021-06-08 DIAGNOSIS — R2689 Other abnormalities of gait and mobility: Secondary | ICD-10-CM | POA: Diagnosis not present

## 2021-06-08 DIAGNOSIS — M25562 Pain in left knee: Secondary | ICD-10-CM | POA: Diagnosis not present

## 2021-06-08 DIAGNOSIS — E113293 Type 2 diabetes mellitus with mild nonproliferative diabetic retinopathy without macular edema, bilateral: Secondary | ICD-10-CM | POA: Diagnosis not present

## 2021-06-08 DIAGNOSIS — R296 Repeated falls: Secondary | ICD-10-CM | POA: Diagnosis not present

## 2021-06-12 DIAGNOSIS — R2689 Other abnormalities of gait and mobility: Secondary | ICD-10-CM | POA: Diagnosis not present

## 2021-06-12 DIAGNOSIS — R296 Repeated falls: Secondary | ICD-10-CM | POA: Diagnosis not present

## 2021-06-12 DIAGNOSIS — I10 Essential (primary) hypertension: Secondary | ICD-10-CM | POA: Diagnosis not present

## 2021-06-12 DIAGNOSIS — E113293 Type 2 diabetes mellitus with mild nonproliferative diabetic retinopathy without macular edema, bilateral: Secondary | ICD-10-CM | POA: Diagnosis not present

## 2021-06-12 DIAGNOSIS — M25562 Pain in left knee: Secondary | ICD-10-CM | POA: Diagnosis not present

## 2021-06-12 DIAGNOSIS — S83242D Other tear of medial meniscus, current injury, left knee, subsequent encounter: Secondary | ICD-10-CM | POA: Diagnosis not present

## 2021-06-13 DIAGNOSIS — D509 Iron deficiency anemia, unspecified: Secondary | ICD-10-CM | POA: Diagnosis not present

## 2021-06-14 DIAGNOSIS — E113293 Type 2 diabetes mellitus with mild nonproliferative diabetic retinopathy without macular edema, bilateral: Secondary | ICD-10-CM | POA: Diagnosis not present

## 2021-06-14 DIAGNOSIS — R2689 Other abnormalities of gait and mobility: Secondary | ICD-10-CM | POA: Diagnosis not present

## 2021-06-14 DIAGNOSIS — I10 Essential (primary) hypertension: Secondary | ICD-10-CM | POA: Diagnosis not present

## 2021-06-14 DIAGNOSIS — R296 Repeated falls: Secondary | ICD-10-CM | POA: Diagnosis not present

## 2021-06-14 DIAGNOSIS — S83242D Other tear of medial meniscus, current injury, left knee, subsequent encounter: Secondary | ICD-10-CM | POA: Diagnosis not present

## 2021-06-14 DIAGNOSIS — M25562 Pain in left knee: Secondary | ICD-10-CM | POA: Diagnosis not present

## 2021-06-16 DIAGNOSIS — R2689 Other abnormalities of gait and mobility: Secondary | ICD-10-CM | POA: Diagnosis not present

## 2021-06-16 DIAGNOSIS — E113293 Type 2 diabetes mellitus with mild nonproliferative diabetic retinopathy without macular edema, bilateral: Secondary | ICD-10-CM | POA: Diagnosis not present

## 2021-06-16 DIAGNOSIS — M25562 Pain in left knee: Secondary | ICD-10-CM | POA: Diagnosis not present

## 2021-06-16 DIAGNOSIS — I10 Essential (primary) hypertension: Secondary | ICD-10-CM | POA: Diagnosis not present

## 2021-06-16 DIAGNOSIS — R296 Repeated falls: Secondary | ICD-10-CM | POA: Diagnosis not present

## 2021-06-16 DIAGNOSIS — S83242D Other tear of medial meniscus, current injury, left knee, subsequent encounter: Secondary | ICD-10-CM | POA: Diagnosis not present

## 2021-06-19 ENCOUNTER — Telehealth: Payer: Self-pay | Admitting: Oncology

## 2021-06-19 NOTE — Telephone Encounter (Signed)
Called 1x, attempted to call to schedule from referral, no answer so voicemail was left to call back. ?

## 2021-06-22 DIAGNOSIS — R296 Repeated falls: Secondary | ICD-10-CM | POA: Diagnosis not present

## 2021-06-22 DIAGNOSIS — I10 Essential (primary) hypertension: Secondary | ICD-10-CM | POA: Diagnosis not present

## 2021-06-22 DIAGNOSIS — R2689 Other abnormalities of gait and mobility: Secondary | ICD-10-CM | POA: Diagnosis not present

## 2021-06-22 DIAGNOSIS — S83242D Other tear of medial meniscus, current injury, left knee, subsequent encounter: Secondary | ICD-10-CM | POA: Diagnosis not present

## 2021-06-22 DIAGNOSIS — M25562 Pain in left knee: Secondary | ICD-10-CM | POA: Diagnosis not present

## 2021-06-22 DIAGNOSIS — E113293 Type 2 diabetes mellitus with mild nonproliferative diabetic retinopathy without macular edema, bilateral: Secondary | ICD-10-CM | POA: Diagnosis not present

## 2021-06-24 DIAGNOSIS — M25562 Pain in left knee: Secondary | ICD-10-CM | POA: Diagnosis not present

## 2021-06-24 DIAGNOSIS — E113293 Type 2 diabetes mellitus with mild nonproliferative diabetic retinopathy without macular edema, bilateral: Secondary | ICD-10-CM | POA: Diagnosis not present

## 2021-06-24 DIAGNOSIS — S83242D Other tear of medial meniscus, current injury, left knee, subsequent encounter: Secondary | ICD-10-CM | POA: Diagnosis not present

## 2021-06-24 DIAGNOSIS — R296 Repeated falls: Secondary | ICD-10-CM | POA: Diagnosis not present

## 2021-06-24 DIAGNOSIS — I10 Essential (primary) hypertension: Secondary | ICD-10-CM | POA: Diagnosis not present

## 2021-06-24 DIAGNOSIS — R2689 Other abnormalities of gait and mobility: Secondary | ICD-10-CM | POA: Diagnosis not present

## 2021-06-27 DIAGNOSIS — S83242D Other tear of medial meniscus, current injury, left knee, subsequent encounter: Secondary | ICD-10-CM | POA: Diagnosis not present

## 2021-06-27 DIAGNOSIS — R2689 Other abnormalities of gait and mobility: Secondary | ICD-10-CM | POA: Diagnosis not present

## 2021-06-27 DIAGNOSIS — R296 Repeated falls: Secondary | ICD-10-CM | POA: Diagnosis not present

## 2021-06-27 DIAGNOSIS — M25562 Pain in left knee: Secondary | ICD-10-CM | POA: Diagnosis not present

## 2021-06-27 DIAGNOSIS — E113293 Type 2 diabetes mellitus with mild nonproliferative diabetic retinopathy without macular edema, bilateral: Secondary | ICD-10-CM | POA: Diagnosis not present

## 2021-06-27 DIAGNOSIS — I10 Essential (primary) hypertension: Secondary | ICD-10-CM | POA: Diagnosis not present

## 2021-06-28 DIAGNOSIS — E113293 Type 2 diabetes mellitus with mild nonproliferative diabetic retinopathy without macular edema, bilateral: Secondary | ICD-10-CM | POA: Diagnosis not present

## 2021-06-28 DIAGNOSIS — S83242D Other tear of medial meniscus, current injury, left knee, subsequent encounter: Secondary | ICD-10-CM | POA: Diagnosis not present

## 2021-06-28 DIAGNOSIS — M25562 Pain in left knee: Secondary | ICD-10-CM | POA: Diagnosis not present

## 2021-06-28 DIAGNOSIS — I10 Essential (primary) hypertension: Secondary | ICD-10-CM | POA: Diagnosis not present

## 2021-06-28 DIAGNOSIS — R296 Repeated falls: Secondary | ICD-10-CM | POA: Diagnosis not present

## 2021-06-28 DIAGNOSIS — R2689 Other abnormalities of gait and mobility: Secondary | ICD-10-CM | POA: Diagnosis not present

## 2021-07-04 DIAGNOSIS — S83242D Other tear of medial meniscus, current injury, left knee, subsequent encounter: Secondary | ICD-10-CM | POA: Diagnosis not present

## 2021-07-04 DIAGNOSIS — R2689 Other abnormalities of gait and mobility: Secondary | ICD-10-CM | POA: Diagnosis not present

## 2021-07-04 DIAGNOSIS — I10 Essential (primary) hypertension: Secondary | ICD-10-CM | POA: Diagnosis not present

## 2021-07-04 DIAGNOSIS — R296 Repeated falls: Secondary | ICD-10-CM | POA: Diagnosis not present

## 2021-07-04 DIAGNOSIS — M25562 Pain in left knee: Secondary | ICD-10-CM | POA: Diagnosis not present

## 2021-07-04 DIAGNOSIS — E113293 Type 2 diabetes mellitus with mild nonproliferative diabetic retinopathy without macular edema, bilateral: Secondary | ICD-10-CM | POA: Diagnosis not present

## 2021-07-05 DIAGNOSIS — I10 Essential (primary) hypertension: Secondary | ICD-10-CM | POA: Diagnosis not present

## 2021-07-05 DIAGNOSIS — R2689 Other abnormalities of gait and mobility: Secondary | ICD-10-CM | POA: Diagnosis not present

## 2021-07-05 DIAGNOSIS — R296 Repeated falls: Secondary | ICD-10-CM | POA: Diagnosis not present

## 2021-07-05 DIAGNOSIS — M25562 Pain in left knee: Secondary | ICD-10-CM | POA: Diagnosis not present

## 2021-07-05 DIAGNOSIS — S83242D Other tear of medial meniscus, current injury, left knee, subsequent encounter: Secondary | ICD-10-CM | POA: Diagnosis not present

## 2021-07-05 DIAGNOSIS — E113293 Type 2 diabetes mellitus with mild nonproliferative diabetic retinopathy without macular edema, bilateral: Secondary | ICD-10-CM | POA: Diagnosis not present

## 2021-07-06 DIAGNOSIS — R296 Repeated falls: Secondary | ICD-10-CM | POA: Diagnosis not present

## 2021-07-06 DIAGNOSIS — G4733 Obstructive sleep apnea (adult) (pediatric): Secondary | ICD-10-CM | POA: Diagnosis not present

## 2021-07-06 DIAGNOSIS — F419 Anxiety disorder, unspecified: Secondary | ICD-10-CM | POA: Diagnosis not present

## 2021-07-06 DIAGNOSIS — R2689 Other abnormalities of gait and mobility: Secondary | ICD-10-CM | POA: Diagnosis not present

## 2021-07-06 DIAGNOSIS — I872 Venous insufficiency (chronic) (peripheral): Secondary | ICD-10-CM | POA: Diagnosis not present

## 2021-07-06 DIAGNOSIS — I10 Essential (primary) hypertension: Secondary | ICD-10-CM | POA: Diagnosis not present

## 2021-07-06 DIAGNOSIS — M25562 Pain in left knee: Secondary | ICD-10-CM | POA: Diagnosis not present

## 2021-07-06 DIAGNOSIS — E113293 Type 2 diabetes mellitus with mild nonproliferative diabetic retinopathy without macular edema, bilateral: Secondary | ICD-10-CM | POA: Diagnosis not present

## 2021-07-06 DIAGNOSIS — E785 Hyperlipidemia, unspecified: Secondary | ICD-10-CM | POA: Diagnosis not present

## 2021-07-06 DIAGNOSIS — E559 Vitamin D deficiency, unspecified: Secondary | ICD-10-CM | POA: Diagnosis not present

## 2021-07-06 DIAGNOSIS — M8580 Other specified disorders of bone density and structure, unspecified site: Secondary | ICD-10-CM | POA: Diagnosis not present

## 2021-07-06 DIAGNOSIS — N3281 Overactive bladder: Secondary | ICD-10-CM | POA: Diagnosis not present

## 2021-07-06 DIAGNOSIS — N393 Stress incontinence (female) (male): Secondary | ICD-10-CM | POA: Diagnosis not present

## 2021-07-06 DIAGNOSIS — I4510 Unspecified right bundle-branch block: Secondary | ICD-10-CM | POA: Diagnosis not present

## 2021-07-06 DIAGNOSIS — I700 Atherosclerosis of aorta: Secondary | ICD-10-CM | POA: Diagnosis not present

## 2021-07-06 DIAGNOSIS — K760 Fatty (change of) liver, not elsewhere classified: Secondary | ICD-10-CM | POA: Diagnosis not present

## 2021-07-06 DIAGNOSIS — S83242D Other tear of medial meniscus, current injury, left knee, subsequent encounter: Secondary | ICD-10-CM | POA: Diagnosis not present

## 2021-07-06 DIAGNOSIS — D509 Iron deficiency anemia, unspecified: Secondary | ICD-10-CM | POA: Diagnosis not present

## 2021-07-06 DIAGNOSIS — K5730 Diverticulosis of large intestine without perforation or abscess without bleeding: Secondary | ICD-10-CM | POA: Diagnosis not present

## 2021-07-06 DIAGNOSIS — M4696 Unspecified inflammatory spondylopathy, lumbar region: Secondary | ICD-10-CM | POA: Diagnosis not present

## 2021-07-06 DIAGNOSIS — F3341 Major depressive disorder, recurrent, in partial remission: Secondary | ICD-10-CM | POA: Diagnosis not present

## 2021-07-06 DIAGNOSIS — J309 Allergic rhinitis, unspecified: Secondary | ICD-10-CM | POA: Diagnosis not present

## 2021-07-06 DIAGNOSIS — I2510 Atherosclerotic heart disease of native coronary artery without angina pectoris: Secondary | ICD-10-CM | POA: Diagnosis not present

## 2021-07-06 DIAGNOSIS — J439 Emphysema, unspecified: Secondary | ICD-10-CM | POA: Diagnosis not present

## 2021-07-07 ENCOUNTER — Other Ambulatory Visit: Payer: Self-pay | Admitting: *Deleted

## 2021-07-07 DIAGNOSIS — D509 Iron deficiency anemia, unspecified: Secondary | ICD-10-CM

## 2021-07-10 DIAGNOSIS — S83242D Other tear of medial meniscus, current injury, left knee, subsequent encounter: Secondary | ICD-10-CM | POA: Diagnosis not present

## 2021-07-10 DIAGNOSIS — R2689 Other abnormalities of gait and mobility: Secondary | ICD-10-CM | POA: Diagnosis not present

## 2021-07-10 DIAGNOSIS — E113293 Type 2 diabetes mellitus with mild nonproliferative diabetic retinopathy without macular edema, bilateral: Secondary | ICD-10-CM | POA: Diagnosis not present

## 2021-07-10 DIAGNOSIS — I10 Essential (primary) hypertension: Secondary | ICD-10-CM | POA: Diagnosis not present

## 2021-07-10 DIAGNOSIS — R296 Repeated falls: Secondary | ICD-10-CM | POA: Diagnosis not present

## 2021-07-10 DIAGNOSIS — M25562 Pain in left knee: Secondary | ICD-10-CM | POA: Diagnosis not present

## 2021-07-11 NOTE — Progress Notes (Signed)
New Hematology/Oncology Consult   Requesting MD: Dr. Ronnette Juniper  [phone]      Reason for Consult: Iron deficiency anemia  HPI: [name] is a 76 year old woman referred for evaluation of iron deficiency anemia.  She had a colonoscopy and upper endoscopy on 07/26/2020.  Normal esophagus, erythematous mucosa in the gastric body and antrum, normal examined duodenum (duodenum biopsy showed small bowel mucosa with no significant pathologic changes, negative for features of celiac disease; stomach biopsy with chronic inactive gastritis, negative for H. pylori organisms); five 4 to 6 mm polyps in the sigmoid colon, descending colon, transverse colon and hepatic flexure, diverticulosis in the sigmoid colon and in the descending colon, nonbleeding internal hemorrhoids (tubular adenomas x2, hyperplastic polyps, polypoid colonic mucosa, negative for dysplasia).  No recent blood work included in records from Dr. Encarnacion Slates office.  She had labs on 05/10/2020 at which time the hemoglobin was 10.2, MCV 73, white count 10.6, platelet count 330,000, RDW 17.  She reports a recent capsule endoscopy which was negative for a source of blood loss.  She was able to pull up lab results on her phone from 05/26/2021-hemoglobin 11.3, MCV 74, white blood cell count 12, RDW 18.2, serum iron 28, transferrin 283, percent saturation 7, TIBC 396.    Past Medical History:  Diagnosis Date   Allergic rhinitis    Anxiety    Aortic atherosclerosis ([place])    Cancer ([place])    h/o melanoma '78 and 79   Cervical arthritis    Dr Delman Kitten, S/P injection with good results.    COPD (chronic obstructive pulmonary disease) (HCC)    with emphysema   Coronary artery calcification seen on CAT scan    Depression    and anxiety   Diabetes mellitus without complication (HCC)    Early menopause    Frozen shoulder    H/O frozen shoulder and rotator cuff tendonititis, L s/p injection   GERD (gastroesophageal reflux disease)     PONV (postoperative nausea and vomiting)    Sleep apnea    Vitamin D deficiency      Past Surgical History:  Procedure Laterality Date   ABDOMINAL HYSTERECTOMY     CATARACT EXTRACTION W/ INTRAOCULAR LENS IMPLANT Bilateral    FRACTURE SURGERY Left    wrist   MELANOMA EXCISION     REVERSE SHOULDER ARTHROPLASTY Left 12/28/2019   Procedure: REVERSE SHOULDER ARTHROPLASTY;  Surgeon: Hiram Gash, MD;  Location: [facility];  Service: Orthopedics;  Laterality: Left;     Current Outpatient Medications:    ARNUITY ELLIPTA 200 MCG/ACT AEPB, Inhale 1 puff into the lungs daily., Disp: , Rfl:    atorvastatin (LIPITOR) 10 MG tablet, Take 10 mg by mouth daily., Disp: , Rfl: 3   buPROPion (WELLBUTRIN XL) 300 MG 24 hr tablet, Take 300 mg by mouth daily. , Disp: , Rfl:    celecoxib (CELEBREX) 100 MG capsule, Take 100 mg by mouth 2 (two) times daily., Disp: , Rfl:    fluticasone (FLONASE) 50 MCG/ACT nasal spray, SPRAY 2 SPRAYS INTO EACH NOSTRIL EVERY DAY, Disp: 48 mL, Rfl: 1   Insulin Glargine (BASAGLAR KWIKPEN) 100 UNIT/ML, Inject 45 Units into the skin daily., Disp: , Rfl:    metFORMIN (GLUCOPHAGE-XR) 500 MG 24 hr tablet, Take 500 mg by mouth in the morning and at bedtime. 500 mg in morning and 500 mg at night, Disp: , Rfl: 1   nystatin (MYCOSTATIN/NYSTOP) powder, Apply 1 application topically 2 (two) times daily as needed (yeast). ,  Disp: , Rfl:    sertraline (ZOLOFT) 50 MG tablet, Take 50 mg by mouth daily., Disp: , Rfl:    desoximetasone (TOPICORT) 0.25 % cream, Apply 1 application topically 2 (two) times daily. (Patient not taking: Reported on 07/12/2021), Disp: , Rfl:    hydrOXYzine (ATARAX/VISTARIL) 25 MG tablet, Take 25 mg by mouth daily. (Patient not taking: Reported on 07/12/2021), Disp: , Rfl:    loratadine (CLARITIN) 10 MG tablet, Take 10 mg by mouth daily. (Patient not taking: Reported on 07/12/2021), Disp: , Rfl:    metoprolol tartrate (LOPRESSOR) 100 MG tablet, TAKE 1 TABLET BY MOUTH TWICE  DAILY, Disp: 60 tablet, Rfl: 11   montelukast (SINGULAIR) 10 MG tablet, TAKE 1 TABLET BY MOUTH EVERYDAY AT BEDTIME (Patient not taking: Reported on 07/12/2021), Disp: 90 tablet, Rfl: 0   omeprazole (PRILOSEC) 20 MG capsule, Take 1 capsule (20 mg total) by mouth daily. 30 days for gastroprotection while taking NSAIDs. (Patient not taking: Reported on 07/12/2021), Disp: 30 capsule, Rfl: 0   triamcinolone cream (KENALOG) 0.5 %, Apply 1 application topically as directed. (Patient not taking: Reported on 07/12/2021), Disp: , Rfl: :     Allergies  Allergen Reactions   Morphine And Related Nausea Only   Other     hydocan cough syrup causes nausea   Penicillins Rash    Tolerated Cephalosporin 12/28/2019.     FH: Father had testicular cancer, deceased with cardiac issues; brother deceased with MI.  No family history of a blood disorder.  SOCIAL HISTORY: She lives in [place].  She is retired from Therapist, art work.  She quit smoking 5 years ago.  Occasional alcohol intake.  Review of Systems: Energy level is poor.  She denies known bleeding.  No bloody or black stools.  No hematuria.  No vaginal bleeding.  No fevers or sweats.  No anorexia or weight loss.  She complains of leg pain with ambulation.  No dysphagia.  No nausea or vomiting.  Stable dyspnea on exertion.  No cough.  No change in bowel habits.  Some urinary urgency.  No numbness or tingling in the hands or feet.  She eats a regular diet.  She notes nails are brittle.  No lip or tongue soreness.   Physical Exam:  Blood pressure 136/60, pulse 84, temperature 98.2 F (36.8 C), temperature source Oral, resp. rate 18, height '5\' 1"'$  (1.549 m), weight 240 lb 3.2 oz (109 kg), SpO2 98 %.  HEENT: Geographic tongue.  No thrush. Lungs: Lungs clear bilaterally. Cardiac: Regular rate and rhythm. Abdomen: Abdomen soft and nontender.  No hepatosplenomegaly. Vascular: Trace edema lower leg bilaterally right greater than left. Lymph nodes: No  palpable cervical, supraclavicular, axillary or inguinal lymph nodes. Skin: Erythematous patch of skin with superficial abrasion at the right lower outer leg.  LABS:   Recent Labs    07/12/21 1103  WBC 9.2  HGB 10.1*  HCT 34.1*  PLT 284  Peripheral blood smear-mild increase in polychromasia, no nucleated red blood cells, ovalocytes, few teardrops, rare cigar cell, variation in red cell size; white blood cells appear unremarkable; platelets normal in number.  No results for input(s): NA, K, CL, CO2, GLUCOSE, BUN, CREATININE, CALCIUM in the last 72 hours.    RADIOLOGY:  No results found.  Assessment and Plan:   Microcytic anemia Upper endoscopy 07/26/2020-normal esophagus, erythematous mucosa in the gastric body and antrum, normal examined duodenum (duodenum biopsy showed small bowel mucosa with no significant pathologic changes, negative for features of celiac disease;  stomach biopsy with chronic inactive gastritis, negative for H. pylori organisms) Colonoscopy 07/26/2020-five 4 to 6 mm polyps in the sigmoid colon, descending colon, transverse colon and hepatic flexure, diverticulosis in the sigmoid colon and in the descending colon, nonbleeding internal hemorrhoids (tubular adenomas x2, hyperplastic polyps, polypoid colonic mucosa, negative for dysplasia). Recent camera endoscopy-negative per patient report COPD CAD Hypertension Diabetes  Ms. Brecheen was referred for evaluation of a microcytic anemia.  Red cell indices and peripheral blood smear are consistent with iron deficiency.  A source of blood loss has not been determined.  We are adding a ferritin to today's labs.  We will try to obtain the recent camera endoscopy report.  She will complete a set of stool cards and submit a urine specimen to evaluate for blood.  She will begin ferrous sulfate 325 mg twice daily.  Return for lab and follow-up in approximately 3 weeks.  Patient seen with Dr. Luberta Robertson,  NP 07/12/2021, 2:14 PM   This was a shared visit with Ned Card.  Ms. Waage was interviewed and examined.  I reviewed the peripheral blood smear. She is referred for evaluation of microcytic anemia.  The hematologic indices and reviewed the peripheral blood smear are consistent with a diagnosis of iron deficiency anemia.  She has been evaluated by gastroenterology.  No source of blood loss has been identified.  She will begin a trial of ferrous sulfate.  We will repeat iron studies today.  I was present for greater than 50% of today's visit.  I performed medical decision making.  Julieanne Manson, MD

## 2021-07-12 ENCOUNTER — Inpatient Hospital Stay: Payer: Medicare Other | Attending: Nurse Practitioner | Admitting: Nurse Practitioner

## 2021-07-12 ENCOUNTER — Other Ambulatory Visit: Payer: Self-pay

## 2021-07-12 ENCOUNTER — Inpatient Hospital Stay: Payer: Medicare Other

## 2021-07-12 ENCOUNTER — Encounter: Payer: Self-pay | Admitting: Nurse Practitioner

## 2021-07-12 ENCOUNTER — Other Ambulatory Visit (HOSPITAL_BASED_OUTPATIENT_CLINIC_OR_DEPARTMENT_OTHER): Payer: Self-pay

## 2021-07-12 VITALS — BP 136/60 | HR 84 | Temp 98.2°F | Resp 18 | Ht 61.0 in | Wt 240.2 lb

## 2021-07-12 DIAGNOSIS — E119 Type 2 diabetes mellitus without complications: Secondary | ICD-10-CM | POA: Diagnosis not present

## 2021-07-12 DIAGNOSIS — I10 Essential (primary) hypertension: Secondary | ICD-10-CM | POA: Diagnosis not present

## 2021-07-12 DIAGNOSIS — D509 Iron deficiency anemia, unspecified: Secondary | ICD-10-CM

## 2021-07-12 DIAGNOSIS — I2510 Atherosclerotic heart disease of native coronary artery without angina pectoris: Secondary | ICD-10-CM | POA: Insufficient documentation

## 2021-07-12 LAB — URINALYSIS, COMPLETE (UACMP) WITH MICROSCOPIC
Bilirubin Urine: NEGATIVE
Glucose, UA: NEGATIVE mg/dL
Hgb urine dipstick: NEGATIVE
Ketones, ur: NEGATIVE mg/dL
Leukocytes,Ua: NEGATIVE
Nitrite: POSITIVE — AB
Protein, ur: NEGATIVE mg/dL
Specific Gravity, Urine: 1.025 (ref 1.005–1.030)
pH: 6 (ref 5.0–8.0)

## 2021-07-12 LAB — CBC WITH DIFFERENTIAL (CANCER CENTER ONLY)
Abs Immature Granulocytes: 0.03 10*3/uL (ref 0.00–0.07)
Basophils Absolute: 0 10*3/uL (ref 0.0–0.1)
Basophils Relative: 0 %
Eosinophils Absolute: 0.3 10*3/uL (ref 0.0–0.5)
Eosinophils Relative: 3 %
HCT: 34.1 % — ABNORMAL LOW (ref 36.0–46.0)
Hemoglobin: 10.1 g/dL — ABNORMAL LOW (ref 12.0–15.0)
Immature Granulocytes: 0 %
Lymphocytes Relative: 17 %
Lymphs Abs: 1.6 10*3/uL (ref 0.7–4.0)
MCH: 23.3 pg — ABNORMAL LOW (ref 26.0–34.0)
MCHC: 29.6 g/dL — ABNORMAL LOW (ref 30.0–36.0)
MCV: 78.6 fL — ABNORMAL LOW (ref 80.0–100.0)
Monocytes Absolute: 0.6 10*3/uL (ref 0.1–1.0)
Monocytes Relative: 6 %
Neutro Abs: 6.7 10*3/uL (ref 1.7–7.7)
Neutrophils Relative %: 74 %
Platelet Count: 284 10*3/uL (ref 150–400)
RBC: 4.34 MIL/uL (ref 3.87–5.11)
RDW: 16 % — ABNORMAL HIGH (ref 11.5–15.5)
WBC Count: 9.2 10*3/uL (ref 4.0–10.5)
nRBC: 0 % (ref 0.0–0.2)

## 2021-07-12 LAB — FERRITIN: Ferritin: 36 ng/mL (ref 11–307)

## 2021-07-12 LAB — SAVE SMEAR(SSMR), FOR PROVIDER SLIDE REVIEW

## 2021-07-12 LAB — VITAMIN B12: Vitamin B-12: 312 pg/mL (ref 180–914)

## 2021-07-13 ENCOUNTER — Telehealth: Payer: Self-pay

## 2021-07-13 ENCOUNTER — Other Ambulatory Visit: Payer: Self-pay | Admitting: Nurse Practitioner

## 2021-07-13 DIAGNOSIS — I10 Essential (primary) hypertension: Secondary | ICD-10-CM | POA: Diagnosis not present

## 2021-07-13 DIAGNOSIS — R296 Repeated falls: Secondary | ICD-10-CM | POA: Diagnosis not present

## 2021-07-13 DIAGNOSIS — D509 Iron deficiency anemia, unspecified: Secondary | ICD-10-CM

## 2021-07-13 DIAGNOSIS — E113293 Type 2 diabetes mellitus with mild nonproliferative diabetic retinopathy without macular edema, bilateral: Secondary | ICD-10-CM | POA: Diagnosis not present

## 2021-07-13 DIAGNOSIS — M25562 Pain in left knee: Secondary | ICD-10-CM | POA: Diagnosis not present

## 2021-07-13 DIAGNOSIS — R2689 Other abnormalities of gait and mobility: Secondary | ICD-10-CM | POA: Diagnosis not present

## 2021-07-13 DIAGNOSIS — S83242D Other tear of medial meniscus, current injury, left knee, subsequent encounter: Secondary | ICD-10-CM | POA: Diagnosis not present

## 2021-07-13 LAB — IRON AND TIBC
Iron: 26 ug/dL — ABNORMAL LOW (ref 28–170)
Saturation Ratios: 7 % — ABNORMAL LOW (ref 10.4–31.8)
TIBC: 382 ug/dL (ref 250–450)
UIBC: 356 ug/dL

## 2021-07-13 NOTE — Telephone Encounter (Signed)
Patient gave verbal understanding and I faxed over the lab result to Dr. Dema Severin

## 2021-07-13 NOTE — Telephone Encounter (Signed)
-----   Message from Owens Shark, NP sent at 07/12/2021  3:26 PM EDT ----- Please let her know the urine specimen was negative for blood but looks like she may have an infection.  Please forward the result to her PCP.

## 2021-07-18 ENCOUNTER — Other Ambulatory Visit (HOSPITAL_BASED_OUTPATIENT_CLINIC_OR_DEPARTMENT_OTHER): Payer: Self-pay | Admitting: Emergency Medicine

## 2021-07-18 DIAGNOSIS — I2510 Atherosclerotic heart disease of native coronary artery without angina pectoris: Secondary | ICD-10-CM | POA: Diagnosis not present

## 2021-07-18 DIAGNOSIS — D509 Iron deficiency anemia, unspecified: Secondary | ICD-10-CM | POA: Diagnosis not present

## 2021-07-18 DIAGNOSIS — E113293 Type 2 diabetes mellitus with mild nonproliferative diabetic retinopathy without macular edema, bilateral: Secondary | ICD-10-CM | POA: Diagnosis not present

## 2021-07-18 DIAGNOSIS — R2689 Other abnormalities of gait and mobility: Secondary | ICD-10-CM | POA: Diagnosis not present

## 2021-07-18 DIAGNOSIS — E119 Type 2 diabetes mellitus without complications: Secondary | ICD-10-CM | POA: Diagnosis not present

## 2021-07-18 DIAGNOSIS — R296 Repeated falls: Secondary | ICD-10-CM | POA: Diagnosis not present

## 2021-07-18 DIAGNOSIS — I10 Essential (primary) hypertension: Secondary | ICD-10-CM | POA: Diagnosis not present

## 2021-07-18 DIAGNOSIS — S83242D Other tear of medial meniscus, current injury, left knee, subsequent encounter: Secondary | ICD-10-CM | POA: Diagnosis not present

## 2021-07-18 DIAGNOSIS — M25562 Pain in left knee: Secondary | ICD-10-CM | POA: Diagnosis not present

## 2021-07-18 LAB — OCCULT BLOOD X 1 CARD TO LAB, STOOL
Fecal Occult Bld: NEGATIVE
Fecal Occult Bld: NEGATIVE
Fecal Occult Bld: POSITIVE — AB

## 2021-07-31 DIAGNOSIS — R2689 Other abnormalities of gait and mobility: Secondary | ICD-10-CM | POA: Diagnosis not present

## 2021-07-31 DIAGNOSIS — E113293 Type 2 diabetes mellitus with mild nonproliferative diabetic retinopathy without macular edema, bilateral: Secondary | ICD-10-CM | POA: Diagnosis not present

## 2021-07-31 DIAGNOSIS — I10 Essential (primary) hypertension: Secondary | ICD-10-CM | POA: Diagnosis not present

## 2021-07-31 DIAGNOSIS — M25562 Pain in left knee: Secondary | ICD-10-CM | POA: Diagnosis not present

## 2021-07-31 DIAGNOSIS — R296 Repeated falls: Secondary | ICD-10-CM | POA: Diagnosis not present

## 2021-07-31 DIAGNOSIS — S83242D Other tear of medial meniscus, current injury, left knee, subsequent encounter: Secondary | ICD-10-CM | POA: Diagnosis not present

## 2021-08-01 DIAGNOSIS — R2689 Other abnormalities of gait and mobility: Secondary | ICD-10-CM | POA: Diagnosis not present

## 2021-08-01 DIAGNOSIS — E113293 Type 2 diabetes mellitus with mild nonproliferative diabetic retinopathy without macular edema, bilateral: Secondary | ICD-10-CM | POA: Diagnosis not present

## 2021-08-01 DIAGNOSIS — I10 Essential (primary) hypertension: Secondary | ICD-10-CM | POA: Diagnosis not present

## 2021-08-01 DIAGNOSIS — S83242D Other tear of medial meniscus, current injury, left knee, subsequent encounter: Secondary | ICD-10-CM | POA: Diagnosis not present

## 2021-08-01 DIAGNOSIS — M25562 Pain in left knee: Secondary | ICD-10-CM | POA: Diagnosis not present

## 2021-08-01 DIAGNOSIS — R296 Repeated falls: Secondary | ICD-10-CM | POA: Diagnosis not present

## 2021-08-07 ENCOUNTER — Other Ambulatory Visit: Payer: Self-pay

## 2021-08-07 ENCOUNTER — Inpatient Hospital Stay: Payer: Medicare Other | Attending: Oncology

## 2021-08-07 ENCOUNTER — Inpatient Hospital Stay (HOSPITAL_BASED_OUTPATIENT_CLINIC_OR_DEPARTMENT_OTHER): Payer: Medicare Other | Admitting: Oncology

## 2021-08-07 VITALS — BP 130/70 | HR 86 | Temp 97.9°F | Resp 18 | Ht 61.0 in | Wt 237.8 lb

## 2021-08-07 DIAGNOSIS — D509 Iron deficiency anemia, unspecified: Secondary | ICD-10-CM | POA: Diagnosis not present

## 2021-08-07 DIAGNOSIS — E119 Type 2 diabetes mellitus without complications: Secondary | ICD-10-CM | POA: Insufficient documentation

## 2021-08-07 DIAGNOSIS — I10 Essential (primary) hypertension: Secondary | ICD-10-CM | POA: Diagnosis not present

## 2021-08-07 LAB — CBC WITH DIFFERENTIAL (CANCER CENTER ONLY)
Abs Immature Granulocytes: 0.05 10*3/uL (ref 0.00–0.07)
Basophils Absolute: 0 10*3/uL (ref 0.0–0.1)
Basophils Relative: 0 %
Eosinophils Absolute: 0.3 10*3/uL (ref 0.0–0.5)
Eosinophils Relative: 3 %
HCT: 36.7 % (ref 36.0–46.0)
Hemoglobin: 10.9 g/dL — ABNORMAL LOW (ref 12.0–15.0)
Immature Granulocytes: 0 %
Lymphocytes Relative: 16 %
Lymphs Abs: 1.8 10*3/uL (ref 0.7–4.0)
MCH: 23.6 pg — ABNORMAL LOW (ref 26.0–34.0)
MCHC: 29.7 g/dL — ABNORMAL LOW (ref 30.0–36.0)
MCV: 79.4 fL — ABNORMAL LOW (ref 80.0–100.0)
Monocytes Absolute: 0.7 10*3/uL (ref 0.1–1.0)
Monocytes Relative: 6 %
Neutro Abs: 8.8 10*3/uL — ABNORMAL HIGH (ref 1.7–7.7)
Neutrophils Relative %: 75 %
Platelet Count: 315 10*3/uL (ref 150–400)
RBC: 4.62 MIL/uL (ref 3.87–5.11)
RDW: 16.3 % — ABNORMAL HIGH (ref 11.5–15.5)
WBC Count: 11.8 10*3/uL — ABNORMAL HIGH (ref 4.0–10.5)
nRBC: 0 % (ref 0.0–0.2)

## 2021-08-07 NOTE — Progress Notes (Signed)
  [facility] OFFICE PROGRESS NOTE   Diagnosis: Anemia  INTERVAL HISTORY:   Sarah Miller returns as scheduled.  She feels well.  She is taking iron once daily.  No bleeding.  She has arthritis pain in one of her knees.  Objective:  Vital signs in last 24 hours:  Blood pressure 130/70, pulse 86, temperature 97.9 F (36.6 C), temperature source Oral, resp. rate 18, height '5\' 1"'$  (1.549 m), weight 237 lb 12.8 oz (107.9 kg), SpO2 95 %.    Resp: Lungs clear bilaterally Cardio: Regular rate and rhythm GI: No hepatosplenomegaly Vascular: No leg edema  Skin: Confluent mild erythema and skin thickening at the right lower leg with abrasions   Lab Results:  Lab Results  Component Value Date   WBC 11.8 (H) 08/07/2021   HGB 10.9 (L) 08/07/2021   HCT 36.7 08/07/2021   MCV 79.4 (L) 08/07/2021   PLT 315 08/07/2021   NEUTROABS 8.8 (H) 08/07/2021    CMP  Lab Results  Component Value Date   NA 140 12/25/2019   K 4.0 12/25/2019   CL 101 12/25/2019   CO2 27 12/25/2019   GLUCOSE 128 (H) 12/25/2019   BUN 16 12/25/2019   CREATININE 0.77 12/25/2019   CALCIUM 9.2 12/25/2019   GFRNONAA >60 12/25/2019   GFRAA >60 11/14/2017    Medications: I have reviewed the patient's current medications.   Assessment/Plan:  Microcytic anemia Upper endoscopy 07/26/2020-normal esophagus, erythematous mucosa in the gastric body and antrum, normal examined duodenum (duodenum biopsy showed small bowel mucosa with no significant pathologic changes, negative for features of celiac disease; stomach biopsy with chronic inactive gastritis, negative for H. pylori organisms) Colonoscopy 07/26/2020-five 4 to 6 mm polyps in the sigmoid colon, descending colon, transverse colon and hepatic flexure, diverticulosis in the sigmoid colon and in the descending colon, nonbleeding internal hemorrhoids (tubular adenomas x2, hyperplastic polyps, polypoid colonic mucosa, negative for dysplasia). Camera endoscopy  06/13/2021-normal COPD CAD Hypertension Diabetes   Disposition: Sarah Miller has microcytic anemia.  Hemoglobin is partially improved compared to when she was here last month.  The ferritin was in the low normal range with a low percent transferrin saturation last month.  She will increase the iron supplementation to ferrous sulfate 325 mg twice daily.  She will return for an office and lab visit in 2 months.  The differential diagnosis includes the anemia of chronic disease.  [name], MD  08/07/2021  3:09 PM

## 2021-08-15 ENCOUNTER — Other Ambulatory Visit (HOSPITAL_COMMUNITY): Payer: Self-pay | Admitting: Orthopaedic Surgery

## 2021-08-15 DIAGNOSIS — M25561 Pain in right knee: Secondary | ICD-10-CM | POA: Diagnosis not present

## 2021-08-15 DIAGNOSIS — M79604 Pain in right leg: Secondary | ICD-10-CM

## 2021-08-15 DIAGNOSIS — M25562 Pain in left knee: Secondary | ICD-10-CM | POA: Diagnosis not present

## 2021-08-16 ENCOUNTER — Encounter (HOSPITAL_COMMUNITY): Payer: Medicare Other

## 2021-08-17 IMAGING — CT CT HUMERUS*L* W/O CM
1 series · 12 of 14 positions shown, 15 images · non-contrast
Comparison: Radiographs 12/22/2019

CLINICAL DATA: Fell 2 days ago. Evaluate humeral head and neck
fractures.

EXAM:
CT OF THE UPPER LEFT EXTREMITY WITHOUT CONTRAST
TECHNIQUE: Multidetector CT imaging of the upper left extremity was performed
according to the standard protocol.

[Series 3: ext soft · axial · 0.55mm/px · z∈[-313,-53]mm · 12 of 154 slices shown, 15 images]
[im 12/154  soft-tissue]
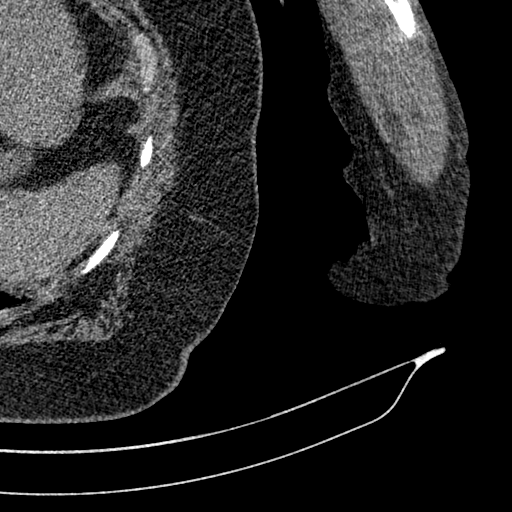
[im 12/154  bone]
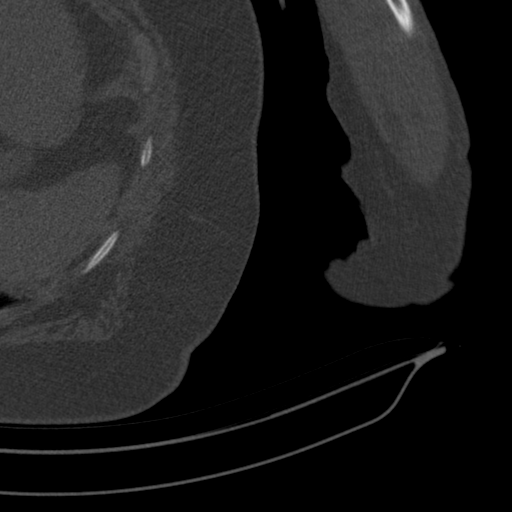
[im 24/154  bone]
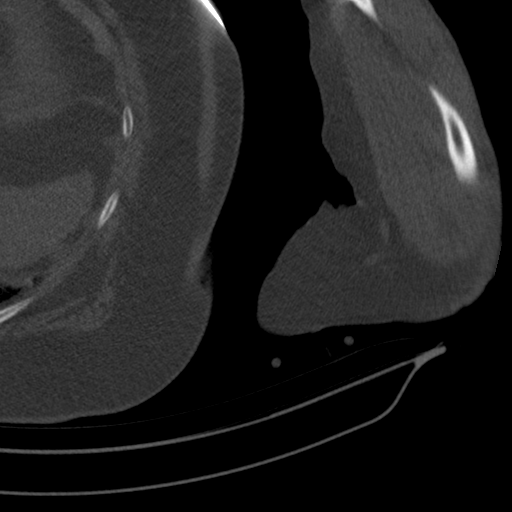
[im 36/154  bone]
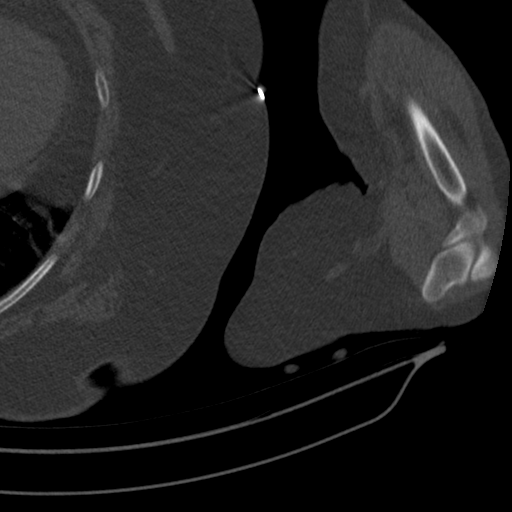
[im 48/154  bone]
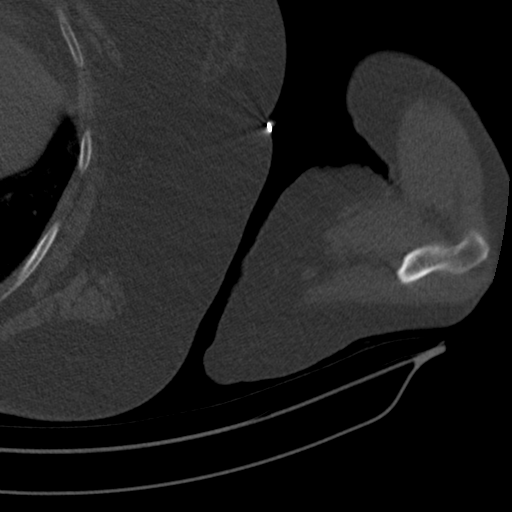
[im 59/154  soft-tissue]
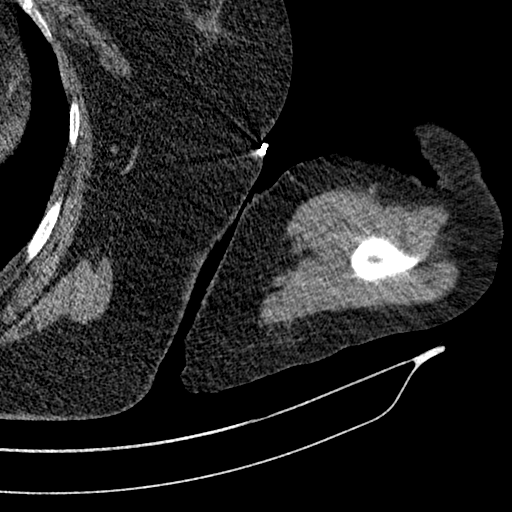
[im 59/154  bone]
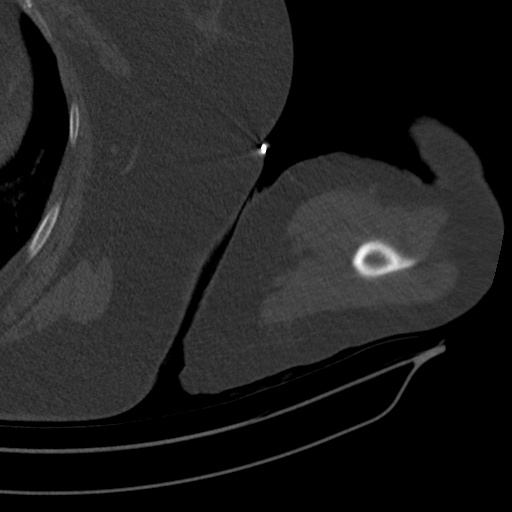
[im 71/154  bone]
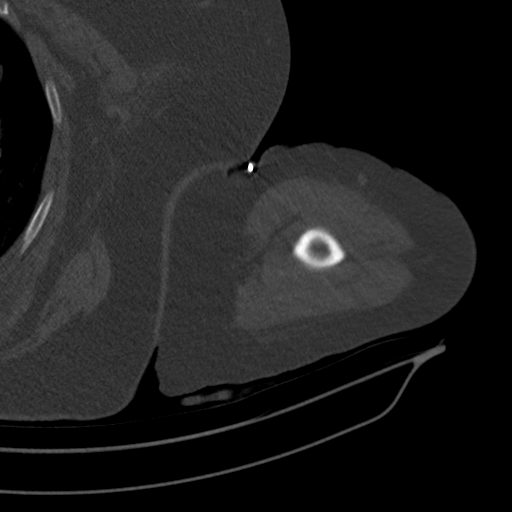
[im 83/154  bone]
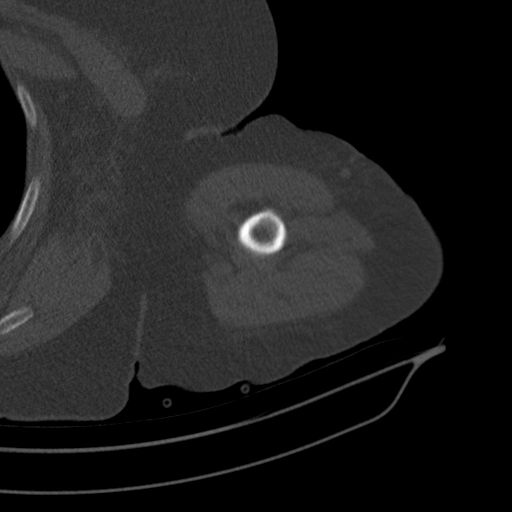
[im 95/154  bone]
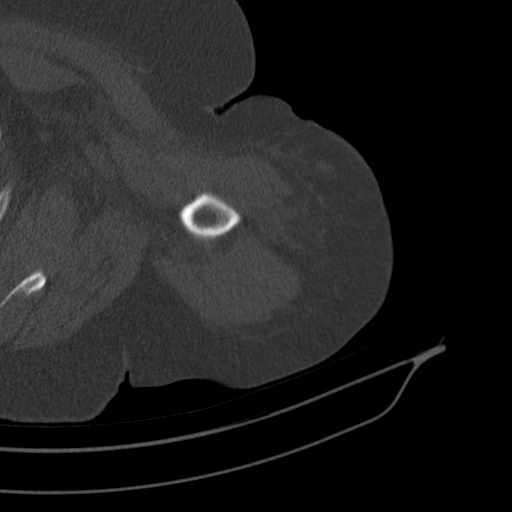
[im 106/154  soft-tissue]
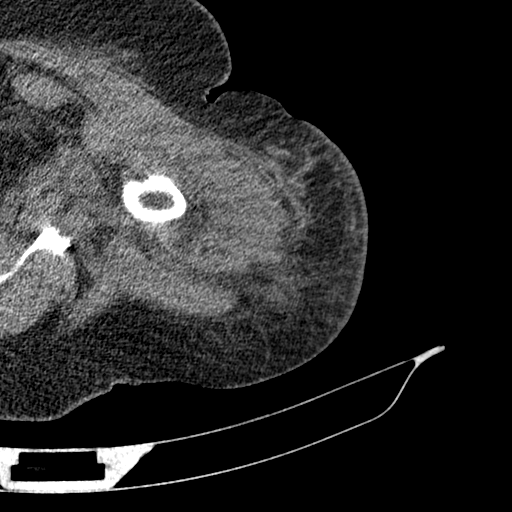
[im 106/154  bone]
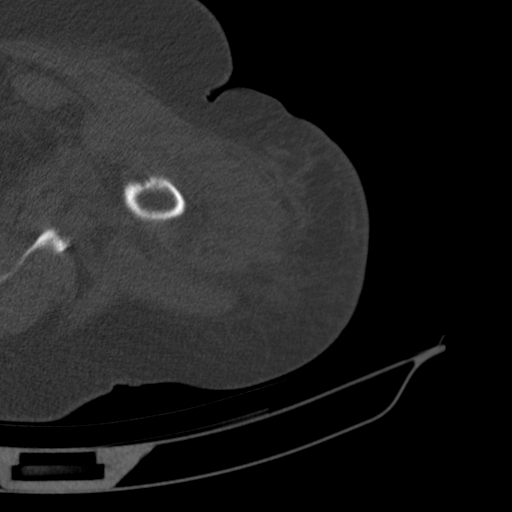
[im 118/154  bone]
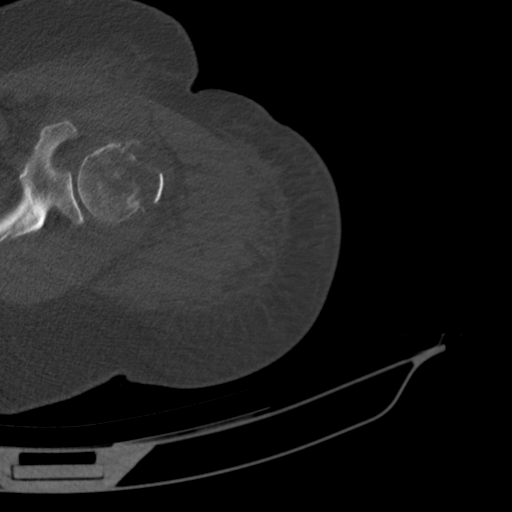
[im 130/154  bone]
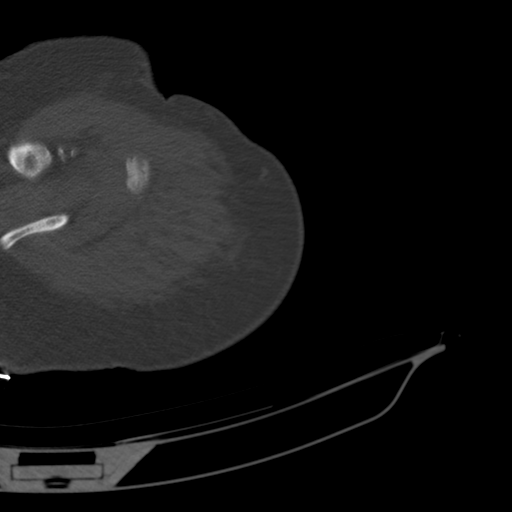
[im 142/154  bone]
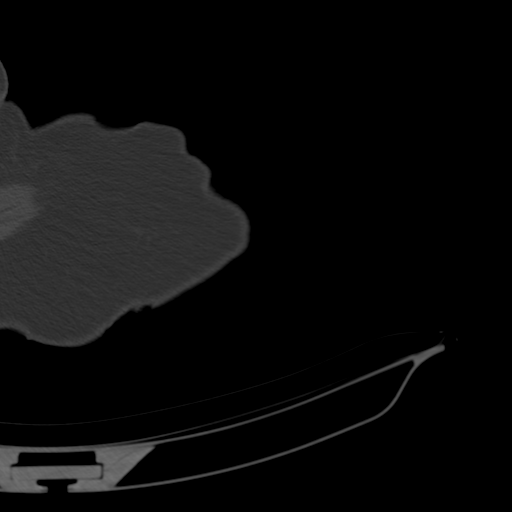

[12 of 14 positions shown; findings below may reference images not displayed]

FINDINGS: Nondisplaced transverse fracture through the humeral neck with mild
impaction of approximately 3-4 mm. There is also a minimally
displaced longitudinal fracture through the greater tuberosity with
maximum displacement of 7 mm. No humeral shaft fracture.

The glenoid is intact. No glenoid fracture. The humeral head is
normally located in the glenoid fossa.

The AC joint is intact. Moderate degenerative changes. The acromion
is type 2 in shape.

Grossly by CT the rotator cuff tendons are intact. No full thickness
retracted rotator cuff tear is identified.

The visualized left ribs are intact and the visualized left lung is
grossly clear.
IMPRESSION: 1. Nondisplaced transverse fracture through the humeral neck with
mild impaction of approximately 3-4 mm.
2. Minimally displaced longitudinal fracture through the greater
tuberosity with maximum displacement of 7 mm.
3. The glenoid is intact.
4. Intact rotator cuff tendons.

## 2021-08-18 ENCOUNTER — Ambulatory Visit (HOSPITAL_COMMUNITY)
Admission: RE | Admit: 2021-08-18 | Discharge: 2021-08-18 | Disposition: A | Discharge status: Home / Self Care | Payer: Medicare Other | Source: Ambulatory Visit | Attending: Orthopaedic Surgery | Admitting: Orthopaedic Surgery

## 2021-08-18 DIAGNOSIS — M79604 Pain in right leg: Secondary | ICD-10-CM

## 2021-08-18 DIAGNOSIS — M25561 Pain in right knee: Secondary | ICD-10-CM | POA: Diagnosis not present

## 2021-08-25 DIAGNOSIS — E559 Vitamin D deficiency, unspecified: Secondary | ICD-10-CM | POA: Diagnosis not present

## 2021-08-25 DIAGNOSIS — J439 Emphysema, unspecified: Secondary | ICD-10-CM | POA: Diagnosis not present

## 2021-08-25 DIAGNOSIS — E785 Hyperlipidemia, unspecified: Secondary | ICD-10-CM | POA: Diagnosis not present

## 2021-08-25 DIAGNOSIS — E1169 Type 2 diabetes mellitus with other specified complication: Secondary | ICD-10-CM | POA: Diagnosis not present

## 2021-08-25 DIAGNOSIS — F3341 Major depressive disorder, recurrent, in partial remission: Secondary | ICD-10-CM | POA: Diagnosis not present

## 2021-08-25 DIAGNOSIS — I700 Atherosclerosis of aorta: Secondary | ICD-10-CM | POA: Diagnosis not present

## 2021-08-25 DIAGNOSIS — F419 Anxiety disorder, unspecified: Secondary | ICD-10-CM | POA: Diagnosis not present

## 2021-08-25 DIAGNOSIS — D509 Iron deficiency anemia, unspecified: Secondary | ICD-10-CM | POA: Diagnosis not present

## 2021-08-25 DIAGNOSIS — I10 Essential (primary) hypertension: Secondary | ICD-10-CM | POA: Diagnosis not present

## 2021-10-13 ENCOUNTER — Inpatient Hospital Stay: Payer: Medicare Other | Attending: Oncology

## 2021-10-13 ENCOUNTER — Inpatient Hospital Stay (HOSPITAL_BASED_OUTPATIENT_CLINIC_OR_DEPARTMENT_OTHER): Payer: Medicare Other | Admitting: Nurse Practitioner

## 2021-10-13 ENCOUNTER — Encounter: Payer: Self-pay | Admitting: Nurse Practitioner

## 2021-10-13 ENCOUNTER — Telehealth: Payer: Self-pay

## 2021-10-13 VITALS — BP 136/58 | HR 80 | Temp 98.1°F | Resp 18 | Ht 61.0 in | Wt 235.0 lb

## 2021-10-13 DIAGNOSIS — I10 Essential (primary) hypertension: Secondary | ICD-10-CM | POA: Insufficient documentation

## 2021-10-13 DIAGNOSIS — E119 Type 2 diabetes mellitus without complications: Secondary | ICD-10-CM | POA: Insufficient documentation

## 2021-10-13 DIAGNOSIS — J449 Chronic obstructive pulmonary disease, unspecified: Secondary | ICD-10-CM | POA: Insufficient documentation

## 2021-10-13 DIAGNOSIS — D509 Iron deficiency anemia, unspecified: Secondary | ICD-10-CM | POA: Diagnosis not present

## 2021-10-13 DIAGNOSIS — I2510 Atherosclerotic heart disease of native coronary artery without angina pectoris: Secondary | ICD-10-CM | POA: Diagnosis not present

## 2021-10-13 LAB — CBC WITH DIFFERENTIAL (CANCER CENTER ONLY)
Abs Immature Granulocytes: 0.06 10*3/uL (ref 0.00–0.07)
Basophils Absolute: 0 10*3/uL (ref 0.0–0.1)
Basophils Relative: 0 %
Eosinophils Absolute: 0.2 10*3/uL (ref 0.0–0.5)
Eosinophils Relative: 2 %
HCT: 36.9 % (ref 36.0–46.0)
Hemoglobin: 11.3 g/dL — ABNORMAL LOW (ref 12.0–15.0)
Immature Granulocytes: 1 %
Lymphocytes Relative: 15 %
Lymphs Abs: 1.9 10*3/uL (ref 0.7–4.0)
MCH: 24.7 pg — ABNORMAL LOW (ref 26.0–34.0)
MCHC: 30.6 g/dL (ref 30.0–36.0)
MCV: 80.6 fL (ref 80.0–100.0)
Monocytes Absolute: 0.9 10*3/uL (ref 0.1–1.0)
Monocytes Relative: 7 %
Neutro Abs: 9.3 10*3/uL — ABNORMAL HIGH (ref 1.7–7.7)
Neutrophils Relative %: 75 %
Platelet Count: 332 10*3/uL (ref 150–400)
RBC: 4.58 MIL/uL (ref 3.87–5.11)
RDW: 17.9 % — ABNORMAL HIGH (ref 11.5–15.5)
WBC Count: 12.4 10*3/uL — ABNORMAL HIGH (ref 4.0–10.5)
nRBC: 0 % (ref 0.0–0.2)

## 2021-10-13 LAB — FERRITIN: Ferritin: 69 ng/mL (ref 11–307)

## 2021-10-13 LAB — IRON AND TIBC
Iron: 48 ug/dL (ref 28–170)
Saturation Ratios: 14 % (ref 10.4–31.8)
TIBC: 351 ug/dL (ref 250–450)
UIBC: 303 ug/dL

## 2021-10-13 NOTE — Progress Notes (Signed)
  [facility] OFFICE PROGRESS NOTE   Diagnosis: Iron deficiency anemia  INTERVAL HISTORY:   Sarah Miller returns as scheduled.  She continues oral iron twice daily.  She is tolerating well.  No nausea or vomiting.  No constipation.  Objective:  Vital signs in last 24 hours:  Blood pressure (!) 136/58, pulse 80, temperature 98.1 F (36.7 C), temperature source Oral, resp. rate 18, height '5\' 1"'$  (1.549 m), weight 235 lb (106.6 kg), SpO2 98 %.    Resp: Lungs clear bilaterally. Cardio: Regular rate and rhythm. GI: No hepatosplenomegaly. Vascular: No leg edema. Skin: Erythema and skin thickening at the right lower leg.  Superficial abrasion at the right lower lateral leg.  No surrounding erythema.   Lab Results:  Lab Results  Component Value Date   WBC 12.4 (H) 10/13/2021   HGB 11.3 (L) 10/13/2021   HCT 36.9 10/13/2021   MCV 80.6 10/13/2021   PLT 332 10/13/2021   NEUTROABS 9.3 (H) 10/13/2021    Imaging:  No results found.  Medications: I have reviewed the patient's current medications.  Assessment/Plan: Microcytic anemia Upper endoscopy 07/26/2020-normal esophagus, erythematous mucosa in the gastric body and antrum, normal examined duodenum (duodenum biopsy showed small bowel mucosa with no significant pathologic changes, negative for features of celiac disease; stomach biopsy with chronic inactive gastritis, negative for H. pylori organisms) Colonoscopy 07/26/2020-five 4 to 6 mm polyps in the sigmoid colon, descending colon, transverse colon and hepatic flexure, diverticulosis in the sigmoid colon and in the descending colon, nonbleeding internal hemorrhoids (tubular adenomas x2, hyperplastic polyps, polypoid colonic mucosa, negative for dysplasia). Camera endoscopy 06/13/2021-normal COPD CAD Hypertension Diabetes  Disposition: Sarah Miller has a microcytic anemia.  Hemoglobin/MCV slowly improving.  She will continue oral iron.  We will follow-up on the iron  studies from today.  She will return for CBC and follow-up visit in 2 months.    Ned Card ANP/GNP-BC   10/13/2021  1:55 PM

## 2021-10-13 NOTE — Telephone Encounter (Signed)
Left a voice mail and send a my chart message to let the patient know her ferritin is higher,continue oral iron.

## 2021-10-13 NOTE — Telephone Encounter (Signed)
-----   Message from Owens Shark, NP sent at 10/13/2021  4:06 PM EDT ----- Let her know ferritin is higher, continue oral iron.  Follow-up as scheduled.

## 2021-11-21 DIAGNOSIS — M25561 Pain in right knee: Secondary | ICD-10-CM | POA: Diagnosis not present

## 2021-11-23 DIAGNOSIS — M25562 Pain in left knee: Secondary | ICD-10-CM | POA: Diagnosis not present

## 2021-12-05 DIAGNOSIS — M170 Bilateral primary osteoarthritis of knee: Secondary | ICD-10-CM | POA: Diagnosis not present

## 2021-12-13 DIAGNOSIS — M170 Bilateral primary osteoarthritis of knee: Secondary | ICD-10-CM | POA: Diagnosis not present

## 2021-12-20 DIAGNOSIS — K219 Gastro-esophageal reflux disease without esophagitis: Secondary | ICD-10-CM | POA: Diagnosis not present

## 2021-12-20 DIAGNOSIS — E785 Hyperlipidemia, unspecified: Secondary | ICD-10-CM | POA: Diagnosis not present

## 2021-12-20 DIAGNOSIS — G4733 Obstructive sleep apnea (adult) (pediatric): Secondary | ICD-10-CM | POA: Diagnosis not present

## 2021-12-20 DIAGNOSIS — Z23 Encounter for immunization: Secondary | ICD-10-CM | POA: Diagnosis not present

## 2021-12-20 DIAGNOSIS — E1169 Type 2 diabetes mellitus with other specified complication: Secondary | ICD-10-CM | POA: Diagnosis not present

## 2021-12-20 DIAGNOSIS — K2930 Chronic superficial gastritis without bleeding: Secondary | ICD-10-CM | POA: Diagnosis not present

## 2021-12-20 DIAGNOSIS — I700 Atherosclerosis of aorta: Secondary | ICD-10-CM | POA: Diagnosis not present

## 2021-12-20 DIAGNOSIS — Z0000 Encounter for general adult medical examination without abnormal findings: Secondary | ICD-10-CM | POA: Diagnosis not present

## 2021-12-20 DIAGNOSIS — J439 Emphysema, unspecified: Secondary | ICD-10-CM | POA: Diagnosis not present

## 2021-12-20 DIAGNOSIS — I10 Essential (primary) hypertension: Secondary | ICD-10-CM | POA: Diagnosis not present

## 2021-12-20 DIAGNOSIS — F3341 Major depressive disorder, recurrent, in partial remission: Secondary | ICD-10-CM | POA: Diagnosis not present

## 2021-12-20 DIAGNOSIS — M8588 Other specified disorders of bone density and structure, other site: Secondary | ICD-10-CM | POA: Diagnosis not present

## 2021-12-21 DIAGNOSIS — M170 Bilateral primary osteoarthritis of knee: Secondary | ICD-10-CM | POA: Diagnosis not present

## 2021-12-22 ENCOUNTER — Inpatient Hospital Stay (HOSPITAL_BASED_OUTPATIENT_CLINIC_OR_DEPARTMENT_OTHER): Payer: Medicare Other | Admitting: Oncology

## 2021-12-22 ENCOUNTER — Inpatient Hospital Stay: Payer: Medicare Other | Attending: Oncology

## 2021-12-22 VITALS — BP 145/80 | HR 77 | Temp 98.2°F | Resp 18 | Ht 61.0 in | Wt 244.0 lb

## 2021-12-22 DIAGNOSIS — Z1231 Encounter for screening mammogram for malignant neoplasm of breast: Secondary | ICD-10-CM | POA: Diagnosis not present

## 2021-12-22 DIAGNOSIS — I2510 Atherosclerotic heart disease of native coronary artery without angina pectoris: Secondary | ICD-10-CM | POA: Diagnosis not present

## 2021-12-22 DIAGNOSIS — I10 Essential (primary) hypertension: Secondary | ICD-10-CM | POA: Insufficient documentation

## 2021-12-22 DIAGNOSIS — J449 Chronic obstructive pulmonary disease, unspecified: Secondary | ICD-10-CM | POA: Insufficient documentation

## 2021-12-22 DIAGNOSIS — D509 Iron deficiency anemia, unspecified: Secondary | ICD-10-CM | POA: Insufficient documentation

## 2021-12-22 DIAGNOSIS — Z86010 Personal history of colonic polyps: Secondary | ICD-10-CM | POA: Diagnosis not present

## 2021-12-22 DIAGNOSIS — Z79899 Other long term (current) drug therapy: Secondary | ICD-10-CM | POA: Diagnosis not present

## 2021-12-22 DIAGNOSIS — E119 Type 2 diabetes mellitus without complications: Secondary | ICD-10-CM | POA: Diagnosis not present

## 2021-12-22 LAB — CBC WITH DIFFERENTIAL (CANCER CENTER ONLY)
Abs Immature Granulocytes: 0.04 10*3/uL (ref 0.00–0.07)
Basophils Absolute: 0 10*3/uL (ref 0.0–0.1)
Basophils Relative: 0 %
Eosinophils Absolute: 0.2 10*3/uL (ref 0.0–0.5)
Eosinophils Relative: 2 %
HCT: 35.3 % — ABNORMAL LOW (ref 36.0–46.0)
Hemoglobin: 10.9 g/dL — ABNORMAL LOW (ref 12.0–15.0)
Immature Granulocytes: 0 %
Lymphocytes Relative: 15 %
Lymphs Abs: 1.5 10*3/uL (ref 0.7–4.0)
MCH: 26.2 pg (ref 26.0–34.0)
MCHC: 30.9 g/dL (ref 30.0–36.0)
MCV: 84.9 fL (ref 80.0–100.0)
Monocytes Absolute: 0.6 10*3/uL (ref 0.1–1.0)
Monocytes Relative: 6 %
Neutro Abs: 7.4 10*3/uL (ref 1.7–7.7)
Neutrophils Relative %: 77 %
Platelet Count: 301 10*3/uL (ref 150–400)
RBC: 4.16 MIL/uL (ref 3.87–5.11)
RDW: 15.7 % — ABNORMAL HIGH (ref 11.5–15.5)
WBC Count: 9.7 10*3/uL (ref 4.0–10.5)
nRBC: 0 % (ref 0.0–0.2)

## 2021-12-22 LAB — FERRITIN: Ferritin: 63 ng/mL (ref 11–307)

## 2021-12-22 NOTE — Progress Notes (Signed)
  [facility] OFFICE PROGRESS NOTE   Diagnosis: Anemia  INTERVAL HISTORY:   Ms. Sabra Heck returns as scheduled.  She continues ferrous sulfate.  She is taking 2 tablets daily.  No nausea or constipation.  No bleeding.  Good appetite.  No complaint.  Objective:  Vital signs in last 24 hours:  Blood pressure (!) 145/80, pulse 77, temperature 98.2 F (36.8 C), resp. rate 18, height '5\' 1"'$  (1.549 m), weight 244 lb (110.7 kg), SpO2 96 %.    HEENT: Geographic tongue Lymphatics: No cervical, supraclavicular, or axillary nodes Resp: Distant breath sounds, lungs clear bilaterally Cardio: Distant heart sounds, regular rhythm GI: No hepatosplenomegaly Vascular: No leg edema   Lab Results:  Lab Results  Component Value Date   WBC 9.7 12/22/2021   HGB 10.9 (L) 12/22/2021   HCT 35.3 (L) 12/22/2021   MCV 84.9 12/22/2021   PLT 301 12/22/2021   NEUTROABS 7.4 12/22/2021    CMP  Lab Results  Component Value Date   NA 140 12/25/2019   K 4.0 12/25/2019   CL 101 12/25/2019   CO2 27 12/25/2019   GLUCOSE 128 (H) 12/25/2019   BUN 16 12/25/2019   CREATININE 0.77 12/25/2019   CALCIUM 9.2 12/25/2019   GFRNONAA >60 12/25/2019   GFRAA >60 11/14/2017    Medications: I have reviewed the patient's current medications.   Assessment/Plan:  Microcytic anemia Upper endoscopy 07/26/2020-normal esophagus, erythematous mucosa in the gastric body and antrum, normal examined duodenum (duodenum biopsy showed small bowel mucosa with no significant pathologic changes, negative for features of celiac disease; stomach biopsy with chronic inactive gastritis, negative for H. pylori organisms) Colonoscopy 07/26/2020-five 4 to 6 mm polyps in the sigmoid colon, descending colon, transverse colon and hepatic flexure, diverticulosis in the sigmoid colon and in the descending colon, nonbleeding internal hemorrhoids (tubular adenomas x2, hyperplastic polyps, polypoid colonic mucosa, negative for  dysplasia). Camera endoscopy 06/13/2021-normal COPD CAD Hypertension Diabetes   Disposition: Ms. Southgate has a history of microcytic anemia.  The anemia has partially corrected with iron replacement and the MCV is now in the normal range.  She has persistent mild anemia.  No source for blood loss was identified.  She may have anemia of chronic disease in addition to iron deficiency.  I recommended she continue oral iron.  She will return for an office visit and CBC in 3 months.  We will check chemistry myeloma panels when she returns in 3 months.  [name], MD  12/22/2021  11:24 AM

## 2021-12-31 DIAGNOSIS — Z23 Encounter for immunization: Secondary | ICD-10-CM | POA: Diagnosis not present

## 2022-01-25 DIAGNOSIS — R921 Mammographic calcification found on diagnostic imaging of breast: Secondary | ICD-10-CM | POA: Diagnosis not present

## 2022-01-25 DIAGNOSIS — R928 Other abnormal and inconclusive findings on diagnostic imaging of breast: Secondary | ICD-10-CM | POA: Diagnosis not present

## 2022-02-07 DIAGNOSIS — R921 Mammographic calcification found on diagnostic imaging of breast: Secondary | ICD-10-CM | POA: Diagnosis not present

## 2022-02-07 DIAGNOSIS — R922 Inconclusive mammogram: Secondary | ICD-10-CM | POA: Diagnosis not present

## 2022-02-07 DIAGNOSIS — R928 Other abnormal and inconclusive findings on diagnostic imaging of breast: Secondary | ICD-10-CM | POA: Diagnosis not present

## 2022-02-13 DIAGNOSIS — M170 Bilateral primary osteoarthritis of knee: Secondary | ICD-10-CM | POA: Diagnosis not present

## 2022-03-23 ENCOUNTER — Inpatient Hospital Stay (HOSPITAL_BASED_OUTPATIENT_CLINIC_OR_DEPARTMENT_OTHER): Payer: Medicare Other | Admitting: Nurse Practitioner

## 2022-03-23 ENCOUNTER — Inpatient Hospital Stay: Payer: Medicare Other | Attending: Oncology

## 2022-03-23 ENCOUNTER — Encounter: Payer: Self-pay | Admitting: Nurse Practitioner

## 2022-03-23 VITALS — BP 134/80 | HR 100 | Temp 98.1°F | Resp 18 | Ht 61.0 in | Wt 236.0 lb

## 2022-03-23 DIAGNOSIS — E119 Type 2 diabetes mellitus without complications: Secondary | ICD-10-CM | POA: Diagnosis not present

## 2022-03-23 DIAGNOSIS — J449 Chronic obstructive pulmonary disease, unspecified: Secondary | ICD-10-CM | POA: Diagnosis not present

## 2022-03-23 DIAGNOSIS — D509 Iron deficiency anemia, unspecified: Secondary | ICD-10-CM

## 2022-03-23 DIAGNOSIS — I2510 Atherosclerotic heart disease of native coronary artery without angina pectoris: Secondary | ICD-10-CM | POA: Insufficient documentation

## 2022-03-23 DIAGNOSIS — I10 Essential (primary) hypertension: Secondary | ICD-10-CM | POA: Insufficient documentation

## 2022-03-23 LAB — CBC WITH DIFFERENTIAL (CANCER CENTER ONLY)
Abs Immature Granulocytes: 0.05 10*3/uL (ref 0.00–0.07)
Basophils Absolute: 0 10*3/uL (ref 0.0–0.1)
Basophils Relative: 0 %
Eosinophils Absolute: 0.1 10*3/uL (ref 0.0–0.5)
Eosinophils Relative: 1 %
HCT: 39 % (ref 36.0–46.0)
Hemoglobin: 12.2 g/dL (ref 12.0–15.0)
Immature Granulocytes: 0 %
Lymphocytes Relative: 15 %
Lymphs Abs: 1.8 10*3/uL (ref 0.7–4.0)
MCH: 25.8 pg — ABNORMAL LOW (ref 26.0–34.0)
MCHC: 31.3 g/dL (ref 30.0–36.0)
MCV: 82.5 fL (ref 80.0–100.0)
Monocytes Absolute: 0.7 10*3/uL (ref 0.1–1.0)
Monocytes Relative: 6 %
Neutro Abs: 9.7 10*3/uL — ABNORMAL HIGH (ref 1.7–7.7)
Neutrophils Relative %: 78 %
Platelet Count: 348 10*3/uL (ref 150–400)
RBC: 4.73 MIL/uL (ref 3.87–5.11)
RDW: 15.7 % — ABNORMAL HIGH (ref 11.5–15.5)
WBC Count: 12.4 10*3/uL — ABNORMAL HIGH (ref 4.0–10.5)
nRBC: 0 % (ref 0.0–0.2)

## 2022-03-23 LAB — BASIC METABOLIC PANEL - CANCER CENTER ONLY
Anion gap: 10 (ref 5–15)
BUN: 22 mg/dL (ref 8–23)
CO2: 30 mmol/L (ref 22–32)
Calcium: 9.2 mg/dL (ref 8.9–10.3)
Chloride: 100 mmol/L (ref 98–111)
Creatinine: 1.17 mg/dL — ABNORMAL HIGH (ref 0.44–1.00)
GFR, Estimated: 49 mL/min — ABNORMAL LOW (ref >60)
Glucose, Bld: 80 mg/dL (ref 70–99)
Potassium: 3.7 mmol/L (ref 3.5–5.1)
Sodium: 140 mmol/L (ref 135–145)

## 2022-03-23 LAB — FERRITIN: Ferritin: 102 ng/mL (ref 11–307)

## 2022-03-23 NOTE — Progress Notes (Signed)
  [facility] OFFICE PROGRESS NOTE   Diagnosis: Anemia  INTERVAL HISTORY:   Ms. Wyman returns as scheduled.  She continues ferrous sulfate.  She denies nausea and constipation.  No bleeding.  She recently began Ozempic.  She estimates 10 pounds of weight loss.  Objective:  Vital signs in last 24 hours:  Blood pressure 134/80, pulse 100, temperature 98.1 F (36.7 C), temperature source Oral, resp. rate 18, height '5\' 1"'$  (1.549 m), weight 236 lb (107 kg), SpO2 100 %.    Resp: Distant breath sounds.  Lungs are clear.  No respiratory distress. Cardio: Regular rate and rhythm. GI: No hepatosplenomegaly. Vascular: No leg edema.   Lab Results:  Lab Results  Component Value Date   WBC 12.4 (H) 03/23/2022   HGB 12.2 03/23/2022   HCT 39.0 03/23/2022   MCV 82.5 03/23/2022   PLT 348 03/23/2022   NEUTROABS 9.7 (H) 03/23/2022    Imaging:  No results found.  Medications: I have reviewed the patient's current medications.  Assessment/Plan: Microcytic anemia Upper endoscopy 07/26/2020-normal esophagus, erythematous mucosa in the gastric body and antrum, normal examined duodenum (duodenum biopsy showed small bowel mucosa with no significant pathologic changes, negative for features of celiac disease; stomach biopsy with chronic inactive gastritis, negative for H. pylori organisms) Colonoscopy 07/26/2020-five 4 to 6 mm polyps in the sigmoid colon, descending colon, transverse colon and hepatic flexure, diverticulosis in the sigmoid colon and in the descending colon, nonbleeding internal hemorrhoids (tubular adenomas x2, hyperplastic polyps, polypoid colonic mucosa, negative for dysplasia). Camera endoscopy 06/13/2021-normal COPD CAD Hypertension Diabetes  Disposition: Ms. Motl has a history of a microcytic anemia.  CBC from today shows that hemoglobin has corrected into normal range.  She will continue oral iron.  We will follow-up on the outstanding labs from today.   She will return for a CBC and follow-up visit in 3 months.    Ned Card ANP/GNP-BC   03/23/2022  2:12 PM

## 2022-03-24 LAB — IRON AND TIBC
Iron: 49 ug/dL (ref 28–170)
Saturation Ratios: 13 % (ref 10.4–31.8)
TIBC: 368 ug/dL (ref 250–450)
UIBC: 319 ug/dL

## 2022-03-26 LAB — KAPPA/LAMBDA LIGHT CHAINS
Kappa free light chain: 33.9 mg/L — ABNORMAL HIGH (ref 3.3–19.4)
Kappa, lambda light chain ratio: 2.51 — ABNORMAL HIGH (ref 0.26–1.65)
Lambda free light chains: 13.5 mg/L (ref 5.7–26.3)

## 2022-03-27 ENCOUNTER — Telehealth: Payer: Self-pay | Admitting: Nurse Practitioner

## 2022-03-27 NOTE — Telephone Encounter (Signed)
I returned a call to Ms. Salberg's daughter, Gerhard Perches, regarding serum free light chain results.  We reviewed the kappa free light chains returned mildly elevated.  A myeloma panel is pending.  We will contact them once that result is available.

## 2022-03-29 LAB — MULTIPLE MYELOMA PANEL, SERUM
Albumin SerPl Elph-Mcnc: 3.5 g/dL (ref 2.9–4.4)
Albumin/Glob SerPl: 1.3 (ref 0.7–1.7)
Alpha 1: 0.3 g/dL (ref 0.0–0.4)
Alpha2 Glob SerPl Elph-Mcnc: 1 g/dL (ref 0.4–1.0)
B-Globulin SerPl Elph-Mcnc: 1 g/dL (ref 0.7–1.3)
Gamma Glob SerPl Elph-Mcnc: 0.7 g/dL (ref 0.4–1.8)
Globulin, Total: 2.9 g/dL (ref 2.2–3.9)
IgA: 134 mg/dL (ref 64–422)
IgG (Immunoglobin G), Serum: 734 mg/dL (ref 586–1602)
IgM (Immunoglobulin M), Srm: 55 mg/dL (ref 26–217)
Total Protein ELP: 6.4 g/dL (ref 6.0–8.5)

## 2022-04-06 DIAGNOSIS — I10 Essential (primary) hypertension: Secondary | ICD-10-CM | POA: Diagnosis not present

## 2022-04-06 DIAGNOSIS — J439 Emphysema, unspecified: Secondary | ICD-10-CM | POA: Diagnosis not present

## 2022-04-06 DIAGNOSIS — E785 Hyperlipidemia, unspecified: Secondary | ICD-10-CM | POA: Diagnosis not present

## 2022-04-06 DIAGNOSIS — E1169 Type 2 diabetes mellitus with other specified complication: Secondary | ICD-10-CM | POA: Diagnosis not present

## 2022-04-25 DIAGNOSIS — F419 Anxiety disorder, unspecified: Secondary | ICD-10-CM | POA: Diagnosis not present

## 2022-04-25 DIAGNOSIS — E785 Hyperlipidemia, unspecified: Secondary | ICD-10-CM | POA: Diagnosis not present

## 2022-04-25 DIAGNOSIS — E1169 Type 2 diabetes mellitus with other specified complication: Secondary | ICD-10-CM | POA: Diagnosis not present

## 2022-04-25 DIAGNOSIS — J309 Allergic rhinitis, unspecified: Secondary | ICD-10-CM | POA: Diagnosis not present

## 2022-04-25 DIAGNOSIS — F3341 Major depressive disorder, recurrent, in partial remission: Secondary | ICD-10-CM | POA: Diagnosis not present

## 2022-04-25 DIAGNOSIS — J439 Emphysema, unspecified: Secondary | ICD-10-CM | POA: Diagnosis not present

## 2022-04-25 DIAGNOSIS — I10 Essential (primary) hypertension: Secondary | ICD-10-CM | POA: Diagnosis not present

## 2022-04-25 DIAGNOSIS — I700 Atherosclerosis of aorta: Secondary | ICD-10-CM | POA: Diagnosis not present

## 2022-05-11 DIAGNOSIS — N289 Disorder of kidney and ureter, unspecified: Secondary | ICD-10-CM | POA: Diagnosis not present

## 2022-06-22 ENCOUNTER — Inpatient Hospital Stay (HOSPITAL_BASED_OUTPATIENT_CLINIC_OR_DEPARTMENT_OTHER): Payer: Medicare Other | Admitting: Oncology

## 2022-06-22 ENCOUNTER — Inpatient Hospital Stay: Payer: Medicare Other | Attending: Oncology

## 2022-06-22 VITALS — BP 121/84 | HR 90 | Temp 98.1°F | Resp 18 | Ht 61.0 in | Wt 227.4 lb

## 2022-06-22 DIAGNOSIS — D509 Iron deficiency anemia, unspecified: Secondary | ICD-10-CM | POA: Diagnosis not present

## 2022-06-22 DIAGNOSIS — E119 Type 2 diabetes mellitus without complications: Secondary | ICD-10-CM | POA: Insufficient documentation

## 2022-06-22 DIAGNOSIS — J449 Chronic obstructive pulmonary disease, unspecified: Secondary | ICD-10-CM | POA: Diagnosis not present

## 2022-06-22 DIAGNOSIS — I10 Essential (primary) hypertension: Secondary | ICD-10-CM | POA: Diagnosis not present

## 2022-06-22 DIAGNOSIS — I2510 Atherosclerotic heart disease of native coronary artery without angina pectoris: Secondary | ICD-10-CM | POA: Diagnosis not present

## 2022-06-22 LAB — CBC WITH DIFFERENTIAL (CANCER CENTER ONLY)
Abs Immature Granulocytes: 0.05 10*3/uL (ref 0.00–0.07)
Basophils Absolute: 0 10*3/uL (ref 0.0–0.1)
Basophils Relative: 0 %
Eosinophils Absolute: 0.2 10*3/uL (ref 0.0–0.5)
Eosinophils Relative: 2 %
HCT: 38.3 % (ref 36.0–46.0)
Hemoglobin: 11.8 g/dL — ABNORMAL LOW (ref 12.0–15.0)
Immature Granulocytes: 0 %
Lymphocytes Relative: 10 %
Lymphs Abs: 1.2 10*3/uL (ref 0.7–4.0)
MCH: 25.9 pg — ABNORMAL LOW (ref 26.0–34.0)
MCHC: 30.8 g/dL (ref 30.0–36.0)
MCV: 84.2 fL (ref 80.0–100.0)
Monocytes Absolute: 0.7 10*3/uL (ref 0.1–1.0)
Monocytes Relative: 6 %
Neutro Abs: 10.6 10*3/uL — ABNORMAL HIGH (ref 1.7–7.7)
Neutrophils Relative %: 82 %
Platelet Count: 351 10*3/uL (ref 150–400)
RBC: 4.55 MIL/uL (ref 3.87–5.11)
RDW: 15.4 % (ref 11.5–15.5)
WBC Count: 12.9 10*3/uL — ABNORMAL HIGH (ref 4.0–10.5)
nRBC: 0 % (ref 0.0–0.2)

## 2022-06-22 LAB — FERRITIN: Ferritin: 100 ng/mL (ref 11–307)

## 2022-06-22 NOTE — Progress Notes (Signed)
  [facility] OFFICE PROGRESS NOTE   Diagnosis: Anemia  INTERVAL HISTORY:   Sarah Miller returns as scheduled.  She feels well.  Good appetite.  No bleeding.  She is taking iron daily.  She is scheduled to have steroid injections in the knees next week.  She last received steroid injections approximately 2 months ago.  Objective:  Vital signs in last 24 hours:  Blood pressure 121/84, pulse 90, temperature 98.1 F (36.7 C), temperature source Oral, resp. rate 18, height 5\' 1"  (1.549 m), weight 227 lb 6.4 oz (103.1 kg), SpO2 98 %.   Lymphatics: No cervical, supraclavicular, axillary, or inguinal nodes Resp: Distant breath sounds, coarse inspiratory rhonchi at the right posterior base, no respiratory distress Cardio: Regular rhythm, distant heart sounds GI: No hepatosplenomegaly Vascular: No leg edema   Lab Results:  Lab Results  Component Value Date   WBC 12.9 (H) 06/22/2022   HGB 11.8 (L) 06/22/2022   HCT 38.3 06/22/2022   MCV 84.2 06/22/2022   PLT 351 06/22/2022   NEUTROABS 10.6 (H) 06/22/2022    CMP  Lab Results  Component Value Date   NA 140 03/23/2022   K 3.7 03/23/2022   CL 100 03/23/2022   CO2 30 03/23/2022   GLUCOSE 80 03/23/2022   BUN 22 03/23/2022   CREATININE 1.17 (H) 03/23/2022   CALCIUM 9.2 03/23/2022   GFRNONAA 49 (L) 03/23/2022   GFRAA >60 11/14/2017    Medications: I have reviewed the patient's current medications.   Assessment/Plan: Microcytic anemia Upper endoscopy 07/26/2020-normal esophagus, erythematous mucosa in the gastric body and antrum, normal examined duodenum (duodenum biopsy showed small bowel mucosa with no significant pathologic changes, negative for features of celiac disease; stomach biopsy with chronic inactive gastritis, negative for H. pylori organisms) Colonoscopy 07/26/2020-five 4 to 6 mm polyps in the sigmoid colon, descending colon, transverse colon and hepatic flexure, diverticulosis in the sigmoid colon and  in the descending colon, nonbleeding internal hemorrhoids (tubular adenomas x2, hyperplastic polyps, polypoid colonic mucosa, negative for dysplasia). Camera endoscopy 06/13/2021-normal COPD CAD Hypertension Diabetes   Disposition: Sarah Miller has a history of oncocytic anemia, likely iron deficiency anemia.  The anemia has improved and the MCV has corrected into the normal range with iron replacement.  She will continue iron.  The hemoglobin is now in the low end of the normal range.  She has mild neutrophilia.  I have a low clinical suspicion for a myeloproliferative disorder.  The neutrophilia may be related to Flonase or steroid injection therapy.  Sarah Miller will return for an office visit and CBC in 6 months.  Sarah Papas, MD  06/22/2022  1:07 PM

## 2022-06-26 DIAGNOSIS — M170 Bilateral primary osteoarthritis of knee: Secondary | ICD-10-CM | POA: Diagnosis not present

## 2022-09-10 DIAGNOSIS — E119 Type 2 diabetes mellitus without complications: Secondary | ICD-10-CM | POA: Diagnosis not present

## 2022-09-13 DIAGNOSIS — E559 Vitamin D deficiency, unspecified: Secondary | ICD-10-CM | POA: Diagnosis not present

## 2022-09-13 DIAGNOSIS — J439 Emphysema, unspecified: Secondary | ICD-10-CM | POA: Diagnosis not present

## 2022-09-13 DIAGNOSIS — D509 Iron deficiency anemia, unspecified: Secondary | ICD-10-CM | POA: Diagnosis not present

## 2022-09-13 DIAGNOSIS — E1169 Type 2 diabetes mellitus with other specified complication: Secondary | ICD-10-CM | POA: Diagnosis not present

## 2022-09-13 DIAGNOSIS — I10 Essential (primary) hypertension: Secondary | ICD-10-CM | POA: Diagnosis not present

## 2022-09-13 DIAGNOSIS — E785 Hyperlipidemia, unspecified: Secondary | ICD-10-CM | POA: Diagnosis not present

## 2022-09-13 DIAGNOSIS — I700 Atherosclerosis of aorta: Secondary | ICD-10-CM | POA: Diagnosis not present

## 2022-09-13 DIAGNOSIS — R29818 Other symptoms and signs involving the nervous system: Secondary | ICD-10-CM | POA: Diagnosis not present

## 2022-10-03 DIAGNOSIS — H04123 Dry eye syndrome of bilateral lacrimal glands: Secondary | ICD-10-CM | POA: Diagnosis not present

## 2022-10-26 DIAGNOSIS — N6489 Other specified disorders of breast: Secondary | ICD-10-CM | POA: Diagnosis not present

## 2022-10-26 DIAGNOSIS — R922 Inconclusive mammogram: Secondary | ICD-10-CM | POA: Diagnosis not present

## 2022-11-22 ENCOUNTER — Encounter: Payer: Self-pay | Admitting: Family Medicine

## 2022-12-21 ENCOUNTER — Inpatient Hospital Stay: Payer: 59 | Attending: Nurse Practitioner | Admitting: Nurse Practitioner

## 2022-12-21 ENCOUNTER — Telehealth: Payer: Self-pay

## 2022-12-21 ENCOUNTER — Encounter: Payer: Self-pay | Admitting: Nurse Practitioner

## 2022-12-21 ENCOUNTER — Inpatient Hospital Stay: Payer: 59 | Attending: Oncology

## 2022-12-21 VITALS — BP 118/54 | HR 90 | Temp 98.0°F | Resp 18 | Wt 225.7 lb

## 2022-12-21 DIAGNOSIS — J449 Chronic obstructive pulmonary disease, unspecified: Secondary | ICD-10-CM | POA: Insufficient documentation

## 2022-12-21 DIAGNOSIS — D509 Iron deficiency anemia, unspecified: Secondary | ICD-10-CM | POA: Insufficient documentation

## 2022-12-21 DIAGNOSIS — I10 Essential (primary) hypertension: Secondary | ICD-10-CM | POA: Insufficient documentation

## 2022-12-21 DIAGNOSIS — E119 Type 2 diabetes mellitus without complications: Secondary | ICD-10-CM | POA: Insufficient documentation

## 2022-12-21 LAB — CBC WITH DIFFERENTIAL (CANCER CENTER ONLY)
Abs Immature Granulocytes: 0.05 10*3/uL (ref 0.00–0.07)
Basophils Absolute: 0 10*3/uL (ref 0.0–0.1)
Basophils Relative: 0 %
Eosinophils Absolute: 0.1 10*3/uL (ref 0.0–0.5)
Eosinophils Relative: 1 %
HCT: 38.7 % (ref 36.0–46.0)
Hemoglobin: 11.9 g/dL — ABNORMAL LOW (ref 12.0–15.0)
Immature Granulocytes: 0 %
Lymphocytes Relative: 13 %
Lymphs Abs: 1.6 10*3/uL (ref 0.7–4.0)
MCH: 24.8 pg — ABNORMAL LOW (ref 26.0–34.0)
MCHC: 30.7 g/dL (ref 30.0–36.0)
MCV: 80.6 fL (ref 80.0–100.0)
Monocytes Absolute: 0.7 10*3/uL (ref 0.1–1.0)
Monocytes Relative: 6 %
Neutro Abs: 9.4 10*3/uL — ABNORMAL HIGH (ref 1.7–7.7)
Neutrophils Relative %: 80 %
Platelet Count: 353 10*3/uL (ref 150–400)
RBC: 4.8 MIL/uL (ref 3.87–5.11)
RDW: 15.9 % — ABNORMAL HIGH (ref 11.5–15.5)
WBC Count: 11.9 10*3/uL — ABNORMAL HIGH (ref 4.0–10.5)
nRBC: 0 % (ref 0.0–0.2)

## 2022-12-21 LAB — FERRITIN: Ferritin: 75 ng/mL (ref 11–307)

## 2022-12-21 NOTE — Telephone Encounter (Signed)
I route patient lab result to pcp

## 2022-12-21 NOTE — Progress Notes (Signed)
  [facility] OFFICE PROGRESS NOTE   Diagnosis: Anemia  INTERVAL HISTORY:   Sarah Miller returns as scheduled.  She is taking 2 ferrous sulfate tablets a day.  No constipation or nausea.  She does note that her stool is green and "sticky".  She is not aware of any bleeding.  Energy level remains poor.  Stable dyspnea on exertion.  Knees continue to be painful.  She reports she will be having injections in the near future.  Objective:  Vital signs in last 24 hours:  Blood pressure (!) 118/54, pulse 90, temperature 98 F (36.7 C), temperature source Oral, resp. rate 18, weight 225 lb 11.2 oz (102.4 kg), SpO2 95%.    HEENT: No thrush or ulcers. Resp: Lungs clear bilaterally. Cardio: Regular rate and rhythm. GI: No hepatosplenomegaly. Vascular: No leg edema.   Lab Results:  Lab Results  Component Value Date   WBC 11.9 (H) 12/21/2022   HGB 11.9 (L) 12/21/2022   HCT 38.7 12/21/2022   MCV 80.6 12/21/2022   PLT 353 12/21/2022   NEUTROABS 9.4 (H) 12/21/2022    Imaging:  No results found.  Medications: I have reviewed the patient's current medications.  Assessment/Plan: Microcytic anemia Upper endoscopy 07/26/2020-normal esophagus, erythematous mucosa in the gastric body and antrum, normal examined duodenum (duodenum biopsy showed small bowel mucosa with no significant pathologic changes, negative for features of celiac disease; stomach biopsy with chronic inactive gastritis, negative for H. pylori organisms) Colonoscopy 07/26/2020-five 4 to 6 mm polyps in the sigmoid colon, descending colon, transverse colon and hepatic flexure, diverticulosis in the sigmoid colon and in the descending colon, nonbleeding internal hemorrhoids (tubular adenomas x2, hyperplastic polyps, polypoid colonic mucosa, negative for dysplasia). Camera endoscopy 06/13/2021-normal COPD CAD Hypertension Diabetes  Disposition: Sarah Miller appears stable.  We are following her for a microcytic  anemia which has improved with oral iron.  Review of today's CBC shows the hemoglobin to be mildly decreased, just below normal range.  We will follow-up on the ferritin from today.  She will continue oral iron.  She has stable mild neutrophilia.  She will return for lab and follow-up in 6 months.  We are available to see her sooner if needed.    Lonna Cobb ANP/GNP-BC   12/21/2022  1:50 PM

## 2022-12-25 DIAGNOSIS — M8588 Other specified disorders of bone density and structure, other site: Secondary | ICD-10-CM | POA: Diagnosis not present

## 2022-12-25 DIAGNOSIS — R922 Inconclusive mammogram: Secondary | ICD-10-CM | POA: Diagnosis not present

## 2022-12-25 DIAGNOSIS — E119 Type 2 diabetes mellitus without complications: Secondary | ICD-10-CM | POA: Diagnosis not present

## 2022-12-25 DIAGNOSIS — Z96612 Presence of left artificial shoulder joint: Secondary | ICD-10-CM | POA: Diagnosis not present

## 2023-01-02 DIAGNOSIS — M8588 Other specified disorders of bone density and structure, other site: Secondary | ICD-10-CM | POA: Diagnosis not present

## 2023-01-02 DIAGNOSIS — J439 Emphysema, unspecified: Secondary | ICD-10-CM | POA: Diagnosis not present

## 2023-01-02 DIAGNOSIS — E119 Type 2 diabetes mellitus without complications: Secondary | ICD-10-CM | POA: Diagnosis not present

## 2023-01-02 DIAGNOSIS — K219 Gastro-esophageal reflux disease without esophagitis: Secondary | ICD-10-CM | POA: Diagnosis not present

## 2023-01-02 DIAGNOSIS — Z0000 Encounter for general adult medical examination without abnormal findings: Secondary | ICD-10-CM | POA: Diagnosis not present

## 2023-01-02 DIAGNOSIS — I700 Atherosclerosis of aorta: Secondary | ICD-10-CM | POA: Diagnosis not present

## 2023-01-02 DIAGNOSIS — E1169 Type 2 diabetes mellitus with other specified complication: Secondary | ICD-10-CM | POA: Diagnosis not present

## 2023-01-02 DIAGNOSIS — E559 Vitamin D deficiency, unspecified: Secondary | ICD-10-CM | POA: Diagnosis not present

## 2023-01-02 DIAGNOSIS — E785 Hyperlipidemia, unspecified: Secondary | ICD-10-CM | POA: Diagnosis not present

## 2023-01-02 DIAGNOSIS — Z23 Encounter for immunization: Secondary | ICD-10-CM | POA: Diagnosis not present

## 2023-01-02 DIAGNOSIS — I10 Essential (primary) hypertension: Secondary | ICD-10-CM | POA: Diagnosis not present

## 2023-01-02 DIAGNOSIS — G4733 Obstructive sleep apnea (adult) (pediatric): Secondary | ICD-10-CM | POA: Diagnosis not present

## 2023-01-03 DIAGNOSIS — M170 Bilateral primary osteoarthritis of knee: Secondary | ICD-10-CM | POA: Diagnosis not present

## 2023-01-04 ENCOUNTER — Encounter: Payer: Self-pay | Admitting: Emergency Medicine

## 2023-04-08 DIAGNOSIS — E1169 Type 2 diabetes mellitus with other specified complication: Secondary | ICD-10-CM | POA: Diagnosis not present

## 2023-04-08 DIAGNOSIS — E785 Hyperlipidemia, unspecified: Secondary | ICD-10-CM | POA: Diagnosis not present

## 2023-04-08 DIAGNOSIS — J439 Emphysema, unspecified: Secondary | ICD-10-CM | POA: Diagnosis not present

## 2023-04-08 DIAGNOSIS — I10 Essential (primary) hypertension: Secondary | ICD-10-CM | POA: Diagnosis not present

## 2023-04-08 DIAGNOSIS — G4733 Obstructive sleep apnea (adult) (pediatric): Secondary | ICD-10-CM | POA: Diagnosis not present

## 2023-04-08 DIAGNOSIS — I700 Atherosclerosis of aorta: Secondary | ICD-10-CM | POA: Diagnosis not present

## 2023-05-14 DIAGNOSIS — M170 Bilateral primary osteoarthritis of knee: Secondary | ICD-10-CM | POA: Diagnosis not present

## 2023-06-21 ENCOUNTER — Inpatient Hospital Stay: Payer: 59 | Attending: Oncology | Admitting: Oncology

## 2023-06-21 ENCOUNTER — Other Ambulatory Visit: Payer: 59

## 2023-06-21 ENCOUNTER — Inpatient Hospital Stay: Payer: Self-pay

## 2023-06-27 ENCOUNTER — Telehealth: Payer: Self-pay | Admitting: Oncology

## 2023-06-27 NOTE — Telephone Encounter (Signed)
 Called to schedule missed appt per inbasket. LVM to return call for scheduling.

## 2023-07-08 DIAGNOSIS — F17201 Nicotine dependence, unspecified, in remission: Secondary | ICD-10-CM | POA: Diagnosis not present

## 2023-07-08 DIAGNOSIS — E785 Hyperlipidemia, unspecified: Secondary | ICD-10-CM | POA: Diagnosis not present

## 2023-07-08 DIAGNOSIS — D509 Iron deficiency anemia, unspecified: Secondary | ICD-10-CM | POA: Diagnosis not present

## 2023-07-08 DIAGNOSIS — J439 Emphysema, unspecified: Secondary | ICD-10-CM | POA: Diagnosis not present

## 2023-07-08 DIAGNOSIS — I700 Atherosclerosis of aorta: Secondary | ICD-10-CM | POA: Diagnosis not present

## 2023-07-08 DIAGNOSIS — G4733 Obstructive sleep apnea (adult) (pediatric): Secondary | ICD-10-CM | POA: Diagnosis not present

## 2023-07-08 DIAGNOSIS — I10 Essential (primary) hypertension: Secondary | ICD-10-CM | POA: Diagnosis not present

## 2023-07-08 DIAGNOSIS — E1169 Type 2 diabetes mellitus with other specified complication: Secondary | ICD-10-CM | POA: Diagnosis not present

## 2023-07-12 ENCOUNTER — Telehealth: Payer: Self-pay | Admitting: Oncology

## 2023-07-12 NOTE — Telephone Encounter (Signed)
 Called to schedule missed appt per inbasket. LVM to return call for scheduling.

## 2023-07-16 ENCOUNTER — Telehealth: Payer: Self-pay | Admitting: Oncology

## 2023-07-16 NOTE — Telephone Encounter (Signed)
 Contacted pt to schedule an appt. Unable to reach via phone, voicemail was left.    Postpone Notification  This message was postponed by Efrain Grant and returned on Monday Jul 15, 2023. Scheduling Message Entered by Elsa Halls on 06/27/2023 at  3:00 PM Priority: Routine <No visit type provided>  Department: [facility]  Provider:  Scheduling Notes:  "No show" 06/21/23--reschedule for lab/OV with BS or Lisa in 1-2 months

## 2023-07-19 DIAGNOSIS — D72829 Elevated white blood cell count, unspecified: Secondary | ICD-10-CM | POA: Diagnosis not present

## 2023-07-25 ENCOUNTER — Inpatient Hospital Stay (HOSPITAL_BASED_OUTPATIENT_CLINIC_OR_DEPARTMENT_OTHER)
Admission: EM | Admit: 2023-07-25 | Discharge: 2023-07-29 | DRG: 309 | Disposition: A | Discharge status: Home Health | Attending: Internal Medicine | Admitting: Internal Medicine

## 2023-07-25 ENCOUNTER — Encounter: Payer: Self-pay | Admitting: *Deleted

## 2023-07-25 ENCOUNTER — Telehealth: Payer: Self-pay | Admitting: *Deleted

## 2023-07-25 ENCOUNTER — Other Ambulatory Visit: Payer: Self-pay

## 2023-07-25 ENCOUNTER — Emergency Department (HOSPITAL_BASED_OUTPATIENT_CLINIC_OR_DEPARTMENT_OTHER): Admitting: Radiology

## 2023-07-25 DIAGNOSIS — Z833 Family history of diabetes mellitus: Secondary | ICD-10-CM | POA: Diagnosis not present

## 2023-07-25 DIAGNOSIS — Z880 Allergy status to penicillin: Secondary | ICD-10-CM

## 2023-07-25 DIAGNOSIS — D72829 Elevated white blood cell count, unspecified: Secondary | ICD-10-CM | POA: Diagnosis present

## 2023-07-25 DIAGNOSIS — Z96612 Presence of left artificial shoulder joint: Secondary | ICD-10-CM | POA: Diagnosis not present

## 2023-07-25 DIAGNOSIS — I4510 Unspecified right bundle-branch block: Secondary | ICD-10-CM | POA: Diagnosis not present

## 2023-07-25 DIAGNOSIS — E876 Hypokalemia: Secondary | ICD-10-CM | POA: Diagnosis not present

## 2023-07-25 DIAGNOSIS — K5900 Constipation, unspecified: Secondary | ICD-10-CM | POA: Diagnosis not present

## 2023-07-25 DIAGNOSIS — Z794 Long term (current) use of insulin: Secondary | ICD-10-CM | POA: Diagnosis not present

## 2023-07-25 DIAGNOSIS — J449 Chronic obstructive pulmonary disease, unspecified: Secondary | ICD-10-CM | POA: Diagnosis not present

## 2023-07-25 DIAGNOSIS — G4733 Obstructive sleep apnea (adult) (pediatric): Secondary | ICD-10-CM | POA: Diagnosis present

## 2023-07-25 DIAGNOSIS — I4891 Unspecified atrial fibrillation: Principal | ICD-10-CM | POA: Diagnosis present

## 2023-07-25 DIAGNOSIS — Z7901 Long term (current) use of anticoagulants: Secondary | ICD-10-CM

## 2023-07-25 DIAGNOSIS — Z79899 Other long term (current) drug therapy: Secondary | ICD-10-CM

## 2023-07-25 DIAGNOSIS — Z85820 Personal history of malignant melanoma of skin: Secondary | ICD-10-CM | POA: Diagnosis not present

## 2023-07-25 DIAGNOSIS — Z7984 Long term (current) use of oral hypoglycemic drugs: Secondary | ICD-10-CM | POA: Diagnosis not present

## 2023-07-25 DIAGNOSIS — R0602 Shortness of breath: Secondary | ICD-10-CM | POA: Diagnosis not present

## 2023-07-25 DIAGNOSIS — E785 Hyperlipidemia, unspecified: Secondary | ICD-10-CM | POA: Diagnosis present

## 2023-07-25 DIAGNOSIS — I499 Cardiac arrhythmia, unspecified: Secondary | ICD-10-CM | POA: Diagnosis not present

## 2023-07-25 DIAGNOSIS — I10 Essential (primary) hypertension: Secondary | ICD-10-CM | POA: Diagnosis not present

## 2023-07-25 DIAGNOSIS — R7989 Other specified abnormal findings of blood chemistry: Secondary | ICD-10-CM | POA: Diagnosis present

## 2023-07-25 DIAGNOSIS — E119 Type 2 diabetes mellitus without complications: Secondary | ICD-10-CM | POA: Diagnosis not present

## 2023-07-25 DIAGNOSIS — Z7951 Long term (current) use of inhaled steroids: Secondary | ICD-10-CM

## 2023-07-25 DIAGNOSIS — D509 Iron deficiency anemia, unspecified: Secondary | ICD-10-CM | POA: Diagnosis not present

## 2023-07-25 DIAGNOSIS — I2510 Atherosclerotic heart disease of native coronary artery without angina pectoris: Secondary | ICD-10-CM | POA: Diagnosis present

## 2023-07-25 DIAGNOSIS — Z7985 Long-term (current) use of injectable non-insulin antidiabetic drugs: Secondary | ICD-10-CM | POA: Diagnosis not present

## 2023-07-25 DIAGNOSIS — Z8041 Family history of malignant neoplasm of ovary: Secondary | ICD-10-CM

## 2023-07-25 DIAGNOSIS — Z6841 Body Mass Index (BMI) 40.0 and over, adult: Secondary | ICD-10-CM

## 2023-07-25 DIAGNOSIS — F32A Depression, unspecified: Secondary | ICD-10-CM | POA: Diagnosis present

## 2023-07-25 DIAGNOSIS — Z83438 Family history of other disorder of lipoprotein metabolism and other lipidemia: Secondary | ICD-10-CM

## 2023-07-25 DIAGNOSIS — Z87891 Personal history of nicotine dependence: Secondary | ICD-10-CM

## 2023-07-25 DIAGNOSIS — J439 Emphysema, unspecified: Secondary | ICD-10-CM | POA: Diagnosis not present

## 2023-07-25 DIAGNOSIS — Z8249 Family history of ischemic heart disease and other diseases of the circulatory system: Secondary | ICD-10-CM | POA: Diagnosis not present

## 2023-07-25 DIAGNOSIS — E1169 Type 2 diabetes mellitus with other specified complication: Secondary | ICD-10-CM

## 2023-07-25 DIAGNOSIS — R0902 Hypoxemia: Secondary | ICD-10-CM | POA: Diagnosis present

## 2023-07-25 DIAGNOSIS — Z604 Social exclusion and rejection: Secondary | ICD-10-CM | POA: Diagnosis present

## 2023-07-25 DIAGNOSIS — Z885 Allergy status to narcotic agent status: Secondary | ICD-10-CM

## 2023-07-25 DIAGNOSIS — Z809 Family history of malignant neoplasm, unspecified: Secondary | ICD-10-CM

## 2023-07-25 LAB — COMPREHENSIVE METABOLIC PANEL WITH GFR
ALT: 5 U/L (ref 0–44)
AST: 13 U/L — ABNORMAL LOW (ref 15–41)
Albumin: 3.9 g/dL (ref 3.5–5.0)
Alkaline Phosphatase: 72 U/L (ref 38–126)
Anion gap: 13 (ref 5–15)
BUN: 26 mg/dL — ABNORMAL HIGH (ref 8–23)
CO2: 27 mmol/L (ref 22–32)
Calcium: 9.3 mg/dL (ref 8.9–10.3)
Chloride: 100 mmol/L (ref 98–111)
Creatinine, Ser: 1.17 mg/dL — ABNORMAL HIGH (ref 0.44–1.00)
GFR, Estimated: 48 mL/min — ABNORMAL LOW (ref >60)
Glucose, Bld: 125 mg/dL — ABNORMAL HIGH (ref 70–99)
Potassium: 3.8 mmol/L (ref 3.5–5.1)
Sodium: 140 mmol/L (ref 135–145)
Total Bilirubin: 0.4 mg/dL (ref 0.0–1.2)
Total Protein: 6.7 g/dL (ref 6.5–8.1)

## 2023-07-25 LAB — CBC WITH DIFFERENTIAL/PLATELET
Abs Immature Granulocytes: 0.08 10*3/uL — ABNORMAL HIGH (ref 0.00–0.07)
Basophils Absolute: 0.1 10*3/uL (ref 0.0–0.1)
Basophils Relative: 0 %
Eosinophils Absolute: 0.2 10*3/uL (ref 0.0–0.5)
Eosinophils Relative: 2 %
HCT: 38.7 % (ref 36.0–46.0)
Hemoglobin: 12 g/dL (ref 12.0–15.0)
Immature Granulocytes: 1 %
Lymphocytes Relative: 17 %
Lymphs Abs: 2.4 10*3/uL (ref 0.7–4.0)
MCH: 25.9 pg — ABNORMAL LOW (ref 26.0–34.0)
MCHC: 31 g/dL (ref 30.0–36.0)
MCV: 83.4 fL (ref 80.0–100.0)
Monocytes Absolute: 1 10*3/uL (ref 0.1–1.0)
Monocytes Relative: 7 %
Neutro Abs: 10.3 10*3/uL — ABNORMAL HIGH (ref 1.7–7.7)
Neutrophils Relative %: 73 %
Platelets: 304 10*3/uL (ref 150–400)
RBC: 4.64 MIL/uL (ref 3.87–5.11)
RDW: 16.3 % — ABNORMAL HIGH (ref 11.5–15.5)
WBC: 14.1 10*3/uL — ABNORMAL HIGH (ref 4.0–10.5)
nRBC: 0 % (ref 0.0–0.2)

## 2023-07-25 LAB — TSH: TSH: 2.47 u[IU]/mL (ref 0.350–4.500)

## 2023-07-25 LAB — TROPONIN T, HIGH SENSITIVITY
Troponin T High Sensitivity: 42 ng/L — ABNORMAL HIGH (ref <19)
Troponin T High Sensitivity: 38 ng/L — ABNORMAL HIGH (ref <19)

## 2023-07-25 LAB — MAGNESIUM: Magnesium: 1.9 mg/dL (ref 1.7–2.4)

## 2023-07-25 LAB — PRO BRAIN NATRIURETIC PEPTIDE: Pro Brain Natriuretic Peptide: 2279 pg/mL — ABNORMAL HIGH (ref <300.0)

## 2023-07-25 MED ORDER — INSULIN ASPART 100 UNIT/ML IJ SOLN
0.0000 [IU] | Freq: Three times a day (TID) | INTRAMUSCULAR | Status: DC
  Administered 2023-07-26 – 2023-07-27 (×3): 1 [IU] via SUBCUTANEOUS
  Administered 2023-07-27: 2 [IU] via SUBCUTANEOUS
  Administered 2023-07-27 – 2023-07-29 (×5): 1 [IU] via SUBCUTANEOUS

## 2023-07-25 MED ORDER — DILTIAZEM HCL-DEXTROSE 125-5 MG/125ML-% IV SOLN (PREMIX)
5.0000 mg/h | INTRAVENOUS | Status: DC
  Administered 2023-07-25: 5 mg/h via INTRAVENOUS
  Filled 2023-07-25 (×2): qty 125

## 2023-07-25 MED ORDER — LORAZEPAM 2 MG/ML IJ SOLN: 0.5000 mg | Freq: Once | INTRAMUSCULAR | Status: DC

## 2023-07-25 MED ORDER — CHLORHEXIDINE GLUCONATE CLOTH 2 % EX PADS
6.0000 | MEDICATED_PAD | Freq: Every day | CUTANEOUS | Status: DC
  Administered 2023-07-25: 6 via TOPICAL

## 2023-07-25 MED ORDER — ORAL CARE MOUTH RINSE: 15.0000 mL | OROMUCOSAL | Status: DC | PRN

## 2023-07-25 NOTE — Telephone Encounter (Signed)
 Left VM for pt to call to schedule lung cancer screening CT. Referral was received by Dr Victorio Grave. Last LDCT done through the screening program was 08/25/19. Also mailed letter to pt's home.

## 2023-07-25 NOTE — ED Notes (Signed)
 Report called to [facility] ICU.  All questions answered

## 2023-07-25 NOTE — ED Triage Notes (Signed)
 Sent from UC. C/o SHOB x 3 days. Hx of COPD, states dizziy when standing. 91% on RA.

## 2023-07-25 NOTE — ED Provider Notes (Signed)
 [place] EMERGENCY DEPARTMENT AT [facility] Provider Note   CSN: [id number] Arrival date & time: 07/25/23  1739     History  Chief Complaint  Patient presents with   Shortness of Breath    Sarah Miller is a 78 y.o. female.  Patient to ED for evaluation of fatigue and DOE for the past 2 days. She has a history of COPD but does not need regular treatment. No cough or fever. No nausea or vomiting. She denies chest pain. Seen in the office with PCP today and sent to the ED with tachycardia. No history of irregular heart rate. She denies feeling any sense of palpitations stating her symptoms.   The history is provided by the patient. No language interpreter was used.  Shortness of Breath      Home Medications Prior to Admission medications   Medication Sig Start Date End Date Taking? Authorizing Provider  ARNUITY ELLIPTA  200 MCG/ACT AEPB Inhale 1 puff into the lungs daily. 11/13/19   [provider]  atorvastatin (LIPITOR) 10 MG tablet Take 10 mg by mouth daily. 09/02/17   [provider]  buPROPion (WELLBUTRIN XL) 300 MG 24 hr tablet Take 300 mg by mouth daily.     [provider]  ferrous sulfate 325 (65 FE) MG EC tablet Take 325 mg by mouth 2 (two) times daily with a meal.    [provider]  fluticasone  (FLONASE ) 50 MCG/ACT nasal spray SPRAY 2 SPRAYS INTO EACH NOSTRIL EVERY DAY 03/17/20   Olalere, Ona Bidding A, MD  Insulin Glargine (BASAGLAR KWIKPEN) 100 UNIT/ML Inject 45 Units into the skin daily. 10/28/19   [provider]  loratadine (CLARITIN) 10 MG tablet Take 10 mg by mouth daily. Patient not taking: Reported on 03/23/2022 11/26/19   [provider]  metFORMIN (GLUCOPHAGE-XR) 500 MG 24 hr tablet Take 500 mg by mouth in the morning and at bedtime. 500 mg in morning and 500 mg at night 10/16/17   [provider]  metoprolol  tartrate (LOPRESSOR ) 100 MG tablet TAKE 1 TABLET BY MOUTH TWICE DAILY 12/25/19 12/22/21   Kroeger, Krista M., PA-C  montelukast  (SINGULAIR ) 10 MG tablet TAKE 1 TABLET BY MOUTH EVERYDAY AT BEDTIME 02/08/20   Wert, Michael B, MD  nystatin (MYCOSTATIN/NYSTOP) powder Apply 1 application topically 2 (two) times daily as needed (yeast).  11/27/19   [provider]  OZEMPIC, 0.25 OR 0.5 MG/DOSE, 2 MG/3ML SOPN Inject into the skin.    [provider]  sertraline (ZOLOFT) 50 MG tablet Take 50 mg by mouth daily. 11/02/19   [provider]  valsartan-hydrochlorothiazide (DIOVAN-HCT) 160-25 MG tablet Take 1 tablet by mouth daily.    [provider]      Allergies    Morphine and codeine, Other, and Penicillins    Review of Systems   Review of Systems  Respiratory:  Positive for shortness of breath.     Physical Exam Updated Vital Signs BP 123/80 (BP Location: Right Arm)   Pulse (!) 108   Temp 98.9 F (37.2 C) (Temporal)   Resp 20   Ht 5\' 1"  (1.549 m)   Wt 104.8 kg   SpO2 95%   BMI 43.65 kg/m  Physical Exam Vitals and nursing note reviewed.  Constitutional:      Appearance: She is well-developed.  HENT:     Head: Normocephalic.  Cardiovascular:     Rate and Rhythm: Tachycardia present. Rhythm irregular.     Heart sounds: No murmur heard. Pulmonary:  Effort: Pulmonary effort is normal.     Breath sounds: Normal breath sounds. No wheezing, rhonchi or rales.  Abdominal:     General: Bowel sounds are normal.     Palpations: Abdomen is soft.     Tenderness: There is no abdominal tenderness. There is no guarding or rebound.  Musculoskeletal:        General: Normal range of motion.     Cervical back: Normal range of motion and neck supple.     Right lower leg: No edema.     Left lower leg: No edema.  Skin:    General: Skin is warm and dry.  Neurological:     General: No focal deficit present.     Mental Status: She is alert and oriented to person, place, and time.     ED Results / Procedures / Treatments   Labs (all labs ordered  are listed, but only abnormal results are displayed) Labs Reviewed  CBC WITH DIFFERENTIAL/PLATELET - Abnormal; Notable for the following components:      Result Value   WBC 14.1 (*)    MCH 25.9 (*)    RDW 16.3 (*)    Neutro Abs 10.3 (*)    Abs Immature Granulocytes 0.08 (*)    All other components within normal limits  COMPREHENSIVE METABOLIC PANEL WITH GFR  PRO BRAIN NATRIURETIC PEPTIDE  MAGNESIUM  TSH  TROPONIN T, HIGH SENSITIVITY    EKG EKG Interpretation Date/Time:  Thursday Jul 25 2023 17:59:02 EDT Ventricular Rate:  147 PR Interval:    QRS Duration:  128 QT Interval:  311 QTC Calculation: 498 R Axis:   70  Text Interpretation: Right bundle branch block when compared to prior now appears to show Afib with RVR. No STEMI Confirmed by Wynell Heath ([id number]) on 07/25/2023 6:09:51 PM  Radiology No results found.  Procedures Procedures    Medications Ordered in ED Medications  LORazepam (ATIVAN) injection 0.5 mg (0 mg Intravenous Hold 07/25/23 1823)    ED Course/ Medical Decision Making/ A&P                                 Medical Decision Making This patient presents to the ED for concern of weakness, this involves an extensive number of treatment options, and is a complaint that carries with it a high risk of complications and morbidity.  The differential diagnosis includes CVA, infection/sepsis, cardiogenic source, PE, anemia   Co morbidities that complicate the patient evaluation  T2DM, COPD, CAD, GERD,    Additional history obtained:  Additional history and/or information obtained from chart review, notable for Note reviewed from office of Harrie Limb, PA at [facility]. Sent to ED with concern for irregular heart rhythm   Lab Tests:  I Ordered, and personally interpreted labs.  The pertinent results include:  pending at time of sign out to Dr. Lorelie Rohrer    Imaging Studies ordered:  I ordered imaging studies including pending at time of sign  out   Cardiac Monitoring:  The patient was maintained on a cardiac monitor.  I personally viewed and interpreted the cardiac monitored which showed an underlying rhythm of: Atrial fibrillation with RVR, rate variable   Medicines ordered and prescription drug management:  I ordered medication including Diltiazem drip  for a-feb RVR   Test Considered:  N/a   Critical Interventions:  N/a  Problem List / ED Course:  Presents with irregular heart rate  from PCP Found to have a-fib RVR, no pain, no history of same.  Dr. Manus Sellers has seen the patient Valsalva attempted unsuccessfully Diltiazem  drip started Plan to admit Patient care signed out the Dr. Manus Sellers for review of labs, re-eval after Cardizem , consultations and admit.    Social Determinants of Health:  Lives alone   Disposition:  After consideration of the diagnostic results and the patients response to treatment, I feel that the patient would benefit from admission for further evaluation and treatment of atrial fibrillation.   Amount and/or Complexity of Data Reviewed Labs: ordered. Radiology: ordered.  Risk Prescription drug management.           Final Clinical Impression(s) / ED Diagnoses Final diagnoses:  Atrial fibrillation with RVR [facility])    Rx / DC Orders ED Discharge Orders     None         Mandy Second, PA-C 07/25/23 1841    Tegeler, Marine Sia, MD 07/26/23 [phone]

## 2023-07-25 NOTE — Progress Notes (Signed)
 Plan of Care Note for accepted transfer   Patient: Sarah Miller MRN: [id number]   DOA: 07/25/2023  Facility requesting transfer: [facility]   Requesting Provider: Dr. Manus Sellers   Reason for transfer: Rapid atrial fibrillation   Facility course: 78 yr old female with COPD and T2DM presents with fatigue, DOE, and occasional palpitations. She is found to be in rapid atrial fibrillation and was started on diltiazem infusion.    Plan of care: The patient is accepted for admission to [facility] unit, at [facility].   Author: Walton Guppy, MD 07/25/2023  Check [email or web address] for on-call coverage.  Nursing staff, Please call [facility] Admits [facility] Consults [facility]ide number on Amion as soon as patient's arrival, so appropriate admitting provider can evaluate the pt.

## 2023-07-25 NOTE — ED Provider Notes (Signed)
 6:45 PM Care assumed from up stool PA-C.  At time of transfer of care, patient awaiting lab results prior to calling for admission for new A-fib with RVR in the setting of lightheadedness fatigue and palpitations.  Patient was placed on oxygen for soft oxygen saturations and shortness of breath.  Patient's EKG revealed A-fib with RVR.  We tried a vagal maneuver and it appeared to show almost a flutter pattern at times.  She will need admission after labs are completed.   Shalamar Crays, Marine Sia, MD 07/26/23 [phone]

## 2023-07-25 NOTE — ED Notes (Signed)
Patient transported to [place] via [facility] at this time °

## 2023-07-26 ENCOUNTER — Observation Stay (HOSPITAL_COMMUNITY)

## 2023-07-26 ENCOUNTER — Other Ambulatory Visit (HOSPITAL_COMMUNITY): Payer: Self-pay

## 2023-07-26 ENCOUNTER — Encounter (HOSPITAL_COMMUNITY): Payer: Self-pay | Admitting: Family Medicine

## 2023-07-26 ENCOUNTER — Telehealth (HOSPITAL_COMMUNITY): Payer: Self-pay

## 2023-07-26 DIAGNOSIS — Z7984 Long term (current) use of oral hypoglycemic drugs: Secondary | ICD-10-CM | POA: Diagnosis not present

## 2023-07-26 DIAGNOSIS — J439 Emphysema, unspecified: Secondary | ICD-10-CM | POA: Diagnosis present

## 2023-07-26 DIAGNOSIS — Z833 Family history of diabetes mellitus: Secondary | ICD-10-CM | POA: Diagnosis not present

## 2023-07-26 DIAGNOSIS — F32A Depression, unspecified: Secondary | ICD-10-CM | POA: Diagnosis present

## 2023-07-26 DIAGNOSIS — D72829 Elevated white blood cell count, unspecified: Secondary | ICD-10-CM | POA: Diagnosis present

## 2023-07-26 DIAGNOSIS — D509 Iron deficiency anemia, unspecified: Secondary | ICD-10-CM | POA: Diagnosis not present

## 2023-07-26 DIAGNOSIS — Z8249 Family history of ischemic heart disease and other diseases of the circulatory system: Secondary | ICD-10-CM | POA: Diagnosis not present

## 2023-07-26 DIAGNOSIS — E876 Hypokalemia: Secondary | ICD-10-CM | POA: Diagnosis present

## 2023-07-26 DIAGNOSIS — Z87891 Personal history of nicotine dependence: Secondary | ICD-10-CM | POA: Diagnosis not present

## 2023-07-26 DIAGNOSIS — J449 Chronic obstructive pulmonary disease, unspecified: Secondary | ICD-10-CM

## 2023-07-26 DIAGNOSIS — I2510 Atherosclerotic heart disease of native coronary artery without angina pectoris: Secondary | ICD-10-CM | POA: Diagnosis present

## 2023-07-26 DIAGNOSIS — I4891 Unspecified atrial fibrillation: Principal | ICD-10-CM

## 2023-07-26 DIAGNOSIS — I4510 Unspecified right bundle-branch block: Secondary | ICD-10-CM | POA: Diagnosis present

## 2023-07-26 DIAGNOSIS — E119 Type 2 diabetes mellitus without complications: Secondary | ICD-10-CM | POA: Diagnosis not present

## 2023-07-26 DIAGNOSIS — I10 Essential (primary) hypertension: Secondary | ICD-10-CM | POA: Diagnosis present

## 2023-07-26 DIAGNOSIS — Z794 Long term (current) use of insulin: Secondary | ICD-10-CM | POA: Diagnosis not present

## 2023-07-26 DIAGNOSIS — R0902 Hypoxemia: Secondary | ICD-10-CM | POA: Diagnosis present

## 2023-07-26 DIAGNOSIS — Z96612 Presence of left artificial shoulder joint: Secondary | ICD-10-CM | POA: Diagnosis present

## 2023-07-26 DIAGNOSIS — E785 Hyperlipidemia, unspecified: Secondary | ICD-10-CM | POA: Diagnosis present

## 2023-07-26 DIAGNOSIS — G4733 Obstructive sleep apnea (adult) (pediatric): Secondary | ICD-10-CM | POA: Diagnosis present

## 2023-07-26 DIAGNOSIS — Z6841 Body Mass Index (BMI) 40.0 and over, adult: Secondary | ICD-10-CM | POA: Diagnosis not present

## 2023-07-26 DIAGNOSIS — Z7985 Long-term (current) use of injectable non-insulin antidiabetic drugs: Secondary | ICD-10-CM | POA: Diagnosis not present

## 2023-07-26 DIAGNOSIS — Z85820 Personal history of malignant melanoma of skin: Secondary | ICD-10-CM | POA: Diagnosis not present

## 2023-07-26 DIAGNOSIS — K5900 Constipation, unspecified: Secondary | ICD-10-CM | POA: Diagnosis present

## 2023-07-26 DIAGNOSIS — R7989 Other specified abnormal findings of blood chemistry: Secondary | ICD-10-CM | POA: Diagnosis present

## 2023-07-26 LAB — GLUCOSE, CAPILLARY
Glucose-Capillary: 133 mg/dL — ABNORMAL HIGH (ref 70–99)
Glucose-Capillary: 169 mg/dL — ABNORMAL HIGH (ref 70–99)
Glucose-Capillary: 180 mg/dL — ABNORMAL HIGH (ref 70–99)
Glucose-Capillary: 190 mg/dL — ABNORMAL HIGH (ref 70–99)
Glucose-Capillary: 215 mg/dL — ABNORMAL HIGH (ref 70–99)

## 2023-07-26 LAB — BASIC METABOLIC PANEL WITH GFR
Anion gap: 7 (ref 5–15)
BUN: 24 mg/dL — ABNORMAL HIGH (ref 8–23)
CO2: 27 mmol/L (ref 22–32)
Calcium: 8.6 mg/dL — ABNORMAL LOW (ref 8.9–10.3)
Chloride: 104 mmol/L (ref 98–111)
Creatinine, Ser: 0.97 mg/dL (ref 0.44–1.00)
GFR, Estimated: >60 mL/min (ref >60)
Glucose, Bld: 163 mg/dL — ABNORMAL HIGH (ref 70–99)
Potassium: 3.4 mmol/L — ABNORMAL LOW (ref 3.5–5.1)
Sodium: 138 mmol/L (ref 135–145)

## 2023-07-26 LAB — ECHOCARDIOGRAM COMPLETE
AV Mean grad: 7 mmHg
AV Peak grad: 13.4 mmHg
Ao pk vel: 1.83 m/s
Area-P 1/2: 3.95 cm2
Calc EF: 66.3 %
Height: 61 in
S' Lateral: 3.3 cm
Single Plane A2C EF: 66.2 %
Single Plane A4C EF: 67.3 %
Weight: 3657.87 [oz_av]

## 2023-07-26 LAB — MRSA NEXT GEN BY PCR, NASAL: MRSA by PCR Next Gen: NOT DETECTED

## 2023-07-26 LAB — CBC
HCT: 38.6 % (ref 36.0–46.0)
Hemoglobin: 11.7 g/dL — ABNORMAL LOW (ref 12.0–15.0)
MCH: 25.5 pg — ABNORMAL LOW (ref 26.0–34.0)
MCHC: 30.3 g/dL (ref 30.0–36.0)
MCV: 84.3 fL (ref 80.0–100.0)
Platelets: 274 10*3/uL (ref 150–400)
RBC: 4.58 MIL/uL (ref 3.87–5.11)
RDW: 16.3 % — ABNORMAL HIGH (ref 11.5–15.5)
WBC: 13.5 10*3/uL — ABNORMAL HIGH (ref 4.0–10.5)
nRBC: 0 % (ref 0.0–0.2)

## 2023-07-26 LAB — HEMOGLOBIN A1C
Hgb A1c MFr Bld: 6.9 % — ABNORMAL HIGH (ref 4.8–5.6)
Mean Plasma Glucose: 151.33 mg/dL

## 2023-07-26 LAB — TSH: TSH: 1.307 u[IU]/mL (ref 0.350–4.500)

## 2023-07-26 LAB — MAGNESIUM: Magnesium: 2.1 mg/dL (ref 1.7–2.4)

## 2023-07-26 LAB — TROPONIN I (HIGH SENSITIVITY): Troponin I (High Sensitivity): 25 ng/L — ABNORMAL HIGH (ref <18)

## 2023-07-26 MED ORDER — AMIODARONE HCL IN DEXTROSE 360-4.14 MG/200ML-% IV SOLN
60.0000 mg/h | INTRAVENOUS | Status: AC
  Administered 2023-07-26 (×2): 60 mg/h via INTRAVENOUS
  Filled 2023-07-26 (×2): qty 200

## 2023-07-26 MED ORDER — ACETAMINOPHEN 650 MG RE SUPP: 650.0000 mg | Freq: Four times a day (QID) | RECTAL | Status: DC | PRN

## 2023-07-26 MED ORDER — BUPROPION HCL ER (XL) 300 MG PO TB24
300.0000 mg | ORAL_TABLET | Freq: Every day | ORAL | Status: DC
  Administered 2023-07-26 – 2023-07-28 (×3): 300 mg via ORAL
  Filled 2023-07-26 (×4): qty 1

## 2023-07-26 MED ORDER — ALBUTEROL SULFATE (2.5 MG/3ML) 0.083% IN NEBU: 2.5000 mg | INHALATION_SOLUTION | RESPIRATORY_TRACT | Status: DC | PRN

## 2023-07-26 MED ORDER — VENLAFAXINE HCL ER 37.5 MG PO CP24
37.5000 mg | ORAL_CAPSULE | Freq: Every day | ORAL | Status: DC
  Filled 2023-07-26: qty 1

## 2023-07-26 MED ORDER — AMIODARONE HCL IN DEXTROSE 360-4.14 MG/200ML-% IV SOLN
30.0000 mg/h | INTRAVENOUS | Status: DC
  Administered 2023-07-26 – 2023-07-27 (×2): 30 mg/h via INTRAVENOUS
  Filled 2023-07-26 (×3): qty 200

## 2023-07-26 MED ORDER — PERFLUTREN LIPID MICROSPHERE
1.0000 mL | INTRAVENOUS | Status: AC | PRN
  Administered 2023-07-26: 2 mL via INTRAVENOUS

## 2023-07-26 MED ORDER — DILTIAZEM HCL-DEXTROSE 125-5 MG/125ML-% IV SOLN (PREMIX)
5.0000 mg/h | INTRAVENOUS | Status: DC
  Filled 2023-07-26: qty 125

## 2023-07-26 MED ORDER — POTASSIUM CHLORIDE CRYS ER 20 MEQ PO TBCR
40.0000 meq | EXTENDED_RELEASE_TABLET | Freq: Once | ORAL | Status: AC
  Administered 2023-07-26: 40 meq via ORAL
  Filled 2023-07-26: qty 2

## 2023-07-26 MED ORDER — ENOXAPARIN SODIUM 120 MG/0.8ML IJ SOSY
1.0000 mg/kg | PREFILLED_SYRINGE | Freq: Two times a day (BID) | INTRAMUSCULAR | Status: DC
  Administered 2023-07-26: 105 mg via SUBCUTANEOUS
  Filled 2023-07-26: qty 0.7

## 2023-07-26 MED ORDER — AMIODARONE LOAD VIA INFUSION
150.0000 mg | Freq: Once | INTRAVENOUS | Status: AC
  Administered 2023-07-26: 150 mg via INTRAVENOUS
  Filled 2023-07-26: qty 83.34

## 2023-07-26 MED ORDER — ACETAMINOPHEN 325 MG PO TABS: 650.0000 mg | ORAL_TABLET | Freq: Four times a day (QID) | ORAL | Status: DC | PRN

## 2023-07-26 MED ORDER — APIXABAN 5 MG PO TABS
5.0000 mg | ORAL_TABLET | Freq: Two times a day (BID) | ORAL | Status: DC
  Administered 2023-07-26 – 2023-07-29 (×7): 5 mg via ORAL
  Filled 2023-07-26 (×7): qty 1

## 2023-07-26 MED ORDER — METOPROLOL TARTRATE 25 MG PO TABS
12.5000 mg | ORAL_TABLET | Freq: Two times a day (BID) | ORAL | Status: DC
  Administered 2023-07-26 – 2023-07-28 (×5): 12.5 mg via ORAL
  Filled 2023-07-26 (×5): qty 1

## 2023-07-26 MED ORDER — ONDANSETRON HCL 4 MG/2ML IJ SOLN
4.0000 mg | Freq: Four times a day (QID) | INTRAMUSCULAR | Status: DC | PRN
  Administered 2023-07-28: 4 mg via INTRAVENOUS
  Filled 2023-07-26: qty 2

## 2023-07-26 MED ORDER — MELATONIN 3 MG PO TABS: 3.0000 mg | ORAL_TABLET | Freq: Every evening | ORAL | Status: DC | PRN

## 2023-07-26 MED ORDER — ONDANSETRON HCL 4 MG PO TABS: 4.0000 mg | ORAL_TABLET | Freq: Four times a day (QID) | ORAL | Status: DC | PRN

## 2023-07-26 NOTE — Consult Note (Addendum)
 As below, patient seen and examined.  Briefly she is a 78 year old female with past medical history of coronary artery disease, obstructive sleep apnea, COPD, diabetes mellitus, hypertension, hyperlipidemia with new onset atrial fibrillation.  Patient has had problems occasionally with fatigue in the past.  However over the past 4 days she has had severe fatigue.  She denies chest pain, palpitations or syncope.  No pedal edema.  She has chronic dyspnea on exertion which has also been worse.  She was found to be in atrial fibrillation and admitted and cardiology asked to evaluate.  Electrocardiogram shows atrial fibrillation with rapid ventricular sponsor and right bundle branch block.  Creatinine 0.97.  proBNP 2279, troponin 42, 38 and 25, hemoglobin 11.7, TSH 1.307.  1 new onset atrial fibrillation-patient was originally placed on Cardizem but became hypotensive.  She is now on metoprolol  12.5 mg twice daily and heart rate remains mildly elevated.  She also had transient sinus rhythm earlier today.  Will begin IV amiodarone to maintain sinus rhythm.  Embolic risk factors include female sex, age 55, diabetes mellitus and hypertension.  CHA2DS2-VASc is 5.  Discontinue Lovenox and begin apixaban.  TSH is normal.  Check echocardiogram for LV function.  Note she apparently has fallen in the past.  We have therefore agreed on short-term anticoagulation at least.  Will discuss Watchman device further in the office.  2 hypertension-patient's blood pressure was low earlier but now improved.  Will follow and adjust medications as needed.  Note valsartan HCT has been discontinued.  3 coronary artery disease-noted on previous CTA-would resume Lipitor at discharge.  4 history of obstructive sleep apnea-patient has not been using CPAP.  Will need follow-up with Dr. Micael Adas for further management as it can be contributing to atrial fibrillation.  5 minimally elevated troponin-no clear trend and no chest pain.  No plans  for further ischemia evaluation.  Alexandria Angel, MD      Cardiology Consultation   Patient ID: Sarah Miller MRN: [id number]; DOB: 22-Jan-1946  Admit date: 07/25/2023 Date of Consult: 07/26/2023  PCP:  Victorio Grave, MD   [facility] Providers Cardiologist:  Gaylyn Keas, MD      Patient Profile: Sarah Miller is a 78 y.o. female with a hx of coronary artery disease, right bundle branch block, obstructive sleep apnea, anxiety, depression, prior tobacco use, COPD, early menopause, hyperlipidemia, hypertension, and diabetes mellitus who is being seen 07/26/2023 for the evaluation of atrial fibrillation at the request of Katrina Parma DO.  History of Present Illness: Sarah Miller is a 78 year old female with prior cardiac history listed below.  He was initially seen by Dr. Micael Adas in 2015 following a new right bundle branch block on her EKG and having some fatigue and shortness of breath. At that time Dr. Micael Adas ordered an echo and a nuclear stress test.  No concerning abnormalities were found on the stress test.  The echo showed an LVEF of 55 to 60% and grade 1 diastolic dysfunction. Was seen a second time by Dr. Micael Adas on 11/2019 following the finding of three-vessel coronary artery calcifications on chest CT. Because of this a coronary CT was ordered.  It found a coronary calcium score of 60 (72nd percentile), minimal nonobstructive plaque in LAD and Lcx, but moderate plaque was found in proximal RCA. FFR analysis showed an FFR of 0.75 in the LAD and 0.79 in the LCX and were interpreted as possibly being functionally significant but found no functionally significant lesions in the RCA.  She presented to the emergency department on 07/25/2023 for worsening fatigue, palpitations, and dyspnea on exertion for the past 2 days.  On interview patient reported she initially seen in office by PCP because of concerns for a respiratory infection.  The PCP advised her to go to the ER after  EKG showed she was in atrial fibrillation with RVR.  Reported that she has had several episodes of feeling poorly over the past few months.  Has previously been diagnosed with sleep apnea.  Had 1 fall and 1 near fall in the past 2 to 3 weeks. Quit smoking about 6 years ago. Denies alcohol use, marijuana use, illicit substance use.  Denies chest pain, fever, chills, lower extremity edema, hematuria, melena, hematochezia, and diaphoresis.  Family history is significant for brother who had a heart attack at age 53 and father at 4.   Labs showed Normal creatinine, hypokalemia with a potassium 3.4, slightly elevated BUN, slightly elevated and flat high-sensitivity troponins 42>38, slightly decreased hemoglobin of 11.7, leukocytosis 13.5, A1C of 6.9, normal TSH, Elevated BNP 2279. EKG showed A fib with RVR rate 147, and a RBBB. Chest x-ray showed no active disease.  Was started on IV Cardizem and metoprolol  tartrate 12.5 mg BID.  Past Medical History:  Diagnosis Date   Allergic rhinitis    Anxiety    Aortic atherosclerosis (HCC)    Cancer (HCC)    h/o melanoma '78 and 79   Cervical arthritis    Dr Jolinda Necessary, S/P injection with good results.    COPD (chronic obstructive pulmonary disease) (HCC)    with emphysema   Coronary artery calcification seen on CAT scan    Depression    and anxiety   Diabetes mellitus without complication (HCC)    Early menopause    Frozen shoulder    H/O frozen shoulder and rotator cuff tendonititis, L s/p injection   GERD (gastroesophageal reflux disease)    PONV (postoperative nausea and vomiting)    Sleep apnea    Vitamin D deficiency     Past Surgical History:  Procedure Laterality Date   ABDOMINAL HYSTERECTOMY     CATARACT EXTRACTION W/ INTRAOCULAR LENS IMPLANT Bilateral    FRACTURE SURGERY Left    wrist   MELANOMA EXCISION     REVERSE SHOULDER ARTHROPLASTY Left 12/28/2019   Procedure: REVERSE SHOULDER ARTHROPLASTY;  Surgeon: Micheline Ahr, MD;   Location: [facility];  Service: Orthopedics;  Laterality: Left;     Home Medications:  Prior to Admission medications   Medication Sig Start Date End Date Taking? Authorizing Provider  albuterol  (VENTOLIN  HFA) 108 (90 Base) MCG/ACT inhaler Inhale 1-2 puffs into the lungs every 6 (six) hours as needed for wheezing or shortness of breath. 07/23/23  Yes [provider]  atorvastatin (LIPITOR) 10 MG tablet Take 10 mg by mouth daily. 09/02/17  Yes [provider]  buPROPion (WELLBUTRIN XL) 300 MG 24 hr tablet Take 300 mg by mouth daily.    Yes [provider]  desvenlafaxine (PRISTIQ) 25 MG 24 hr tablet Take 25 mg by mouth daily. 07/08/23  Yes [provider]  ferrous sulfate 325 (65 FE) MG EC tablet Take 325 mg by mouth 2 (two) times daily with a meal.   Yes [provider]  fluticasone  (FLONASE ) 50 MCG/ACT nasal spray SPRAY 2 SPRAYS INTO EACH NOSTRIL EVERY DAY 03/17/20  Yes Olalere, Adewale A, MD  Insulin Glargine (BASAGLAR KWIKPEN) 100 UNIT/ML Inject 40 Units into the skin at bedtime. 10/28/19  Yes [provider]  metFORMIN (GLUCOPHAGE-XR) 500 MG 24 hr tablet Take 500 mg by mouth in the morning and at bedtime. 500 mg in morning and 500 mg at night 10/16/17  Yes [provider]  montelukast  (SINGULAIR ) 10 MG tablet TAKE 1 TABLET BY MOUTH EVERYDAY AT BEDTIME 02/08/20  Yes Wert, Michael B, MD  MOUNJARO 2.5 MG/0.5ML Pen Inject 2.5 mg into the skin once a week. 07/09/23  Yes [provider]  ondansetron  (ZOFRAN ) 4 MG tablet Take 4 mg by mouth every 8 (eight) hours as needed for nausea or vomiting. 07/19/23  Yes [provider]  valsartan-hydrochlorothiazide (DIOVAN-HCT) 160-12.5 MG tablet Take 1 tablet by mouth daily.   Yes [provider]  ARNUITY ELLIPTA  200 MCG/ACT AEPB Inhale 1 puff into the lungs daily. Patient not taking: Reported on 07/26/2023 11/13/19   [provider]  celecoxib (CELEBREX) 200 MG capsule Take  200 mg by mouth 2 (two) times daily. Patient not taking: Reported on 07/26/2023 07/19/23   [provider]  loratadine (CLARITIN) 10 MG tablet Take 10 mg by mouth daily. Patient not taking: Reported on 03/23/2022 11/26/19   [provider]  metoprolol  tartrate (LOPRESSOR ) 100 MG tablet TAKE 1 TABLET BY MOUTH TWICE DAILY 12/25/19 12/22/21  Kroeger, Krista M., PA-C  nystatin (MYCOSTATIN/NYSTOP) powder Apply 1 application topically 2 (two) times daily as needed (yeast).  Patient not taking: Reported on 07/26/2023 11/27/19   [provider]  OZEMPIC, 0.25 OR 0.5 MG/DOSE, 2 MG/3ML SOPN Inject into the skin. Patient not taking: Reported on 07/26/2023    [provider]  sertraline (ZOLOFT) 50 MG tablet Take 50 mg by mouth daily. Patient not taking: Reported on 07/26/2023 11/02/19   [provider]  valsartan-hydrochlorothiazide (DIOVAN-HCT) 160-25 MG tablet Take 1 tablet by mouth daily. Patient not taking: Reported on 07/26/2023    [provider]    Scheduled Meds:  Chlorhexidine  Gluconate Cloth  6 each Topical QHS   enoxaparin (LOVENOX) injection  1 mg/kg Subcutaneous BID   insulin aspart  0-6 Units Subcutaneous TID WC   metoprolol  tartrate  12.5 mg Oral BID   Continuous Infusions:  PRN Meds: acetaminophen  **OR** acetaminophen , albuterol , melatonin, ondansetron  **OR** ondansetron  (ZOFRAN ) IV, mouth rinse  Allergies:    Allergies  Allergen Reactions   Morphine And Codeine Nausea Only   Other     hydocan cough syrup causes nausea   Penicillins Rash    Tolerated Cephalosporin 12/28/2019.     Social History:   Social History   Socioeconomic History   Marital status: Divorced    Spouse name: Not on file   Number of children: Not on file   Years of education: Not on file   Highest education level: Not on file  Occupational History   Not on file  Tobacco Use   Smoking status: Former    Current packs/day: 0.00    Average packs/day: 0.8  packs/day for 53.0 years (39.8 ttl pk-yrs)    Types: Cigarettes    Start date: 02/27/1963    Quit date: 02/27/2016    Years since quitting: 7.4   Smokeless tobacco: Never  Vaping Use   Vaping status: Never Used  Substance and Sexual Activity   Alcohol use: Yes    Comment: rare   Drug use: No   Sexual activity: Not on file    Comment: Hysterectomy  Other Topics Concern   Not on file  Social History Narrative   Not on file  Social Drivers of Corporate investment banker Strain: Not on file  Food Insecurity: No Food Insecurity (07/25/2023)   Hunger Vital Sign    Worried About Running Out of Food in the Last Year: Never true    Ran Out of Food in the Last Year: Never true  Transportation Needs: No Transportation Needs (07/25/2023)   PRAPARE - Administrator, Civil Service (Medical): No    Lack of Transportation (Non-Medical): No  Physical Activity: Not on file  Stress: Not on file  Social Connections: Socially Isolated (07/25/2023)   Social Connection and Isolation Panel [NHANES]    Frequency of Communication with Friends and Family: More than three times a week    Frequency of Social Gatherings with Friends and Family: Three times a week    Attends Religious Services: Never    Active Member of Clubs or Organizations: No    Attends Banker Meetings: Never    Marital Status: Divorced  Catering manager Violence: Not At Risk (07/25/2023)   Humiliation, Afraid, Rape, and Kick questionnaire    Fear of Current or Ex-Partner: No    Emotionally Abused: No    Physically Abused: No    Sexually Abused: No    Family History:    Family History  Problem Relation Age of Onset   Cancer Mother        ovarian cancer   Hypertension Father    Diabetes Father    Heart attack Father    Cancer Father        testicular   CAD Father    Heart disease Father    Hyperlipidemia Father    Heart attack Brother 56   CAD Brother    Heart disease Brother    CAD Paternal  Grandfather    Hypertension Sister    Cancer Other    Hyperlipidemia Other    Heart disease Other      ROS:  Please see the history of present illness.   All other ROS reviewed and negative.     Physical Exam/Data: Vitals:   07/26/23 0315 07/26/23 0445 07/26/23 0755 07/26/23 0800  BP: (!) 95/48 (!) 108/55  (!) 109/44  Pulse: 75 74  80  Resp: 17 19  (!) 22  Temp: 97.9 F (36.6 C)  97.8 F (36.6 C)   TempSrc: Oral  Axillary   SpO2: 95% 93%  95%  Weight:      Height:        Intake/Output Summary (Last 24 hours) at 07/26/2023 1037 Last data filed at 07/26/2023 0800 Gross per 24 hour  Intake 113.19 ml  Output 200 ml  Net -86.81 ml      07/25/2023   10:00 PM 07/25/2023    6:21 PM 12/21/2022    1:17 PM  Last 3 Weights  Weight (lbs) 228 lb 9.9 oz 231 lb 225 lb 11.2 oz  Weight (kg) 103.7 kg 104.781 kg 102.377 kg     Body mass index is 43.2 kg/m.  General: Overweight elderly female appearing older than stated age. On 2 L [place]. Does not require oxygen at home. HEENT: normal Neck: no JVD Vascular: No carotid bruits; Distal pulses 2+ bilaterally Cardiac:  normal S1, S2; rate tachycardic rhythm irregularly irregular; no murmur  Lungs:  clear to auscultation bilaterally, no wheezing, rhonchi or rales  Abd: soft, nontender, no hepatomegaly  Ext: no edema Musculoskeletal:  No deformities Skin: warm and dry  Neuro:  no focal abnormalities noted Psych:  Normal affect   EKG:  The EKG was personally reviewed and demonstrates:  A fib with RVR rate 146, and a RBBB Telemetry:  Telemetry was personally reviewed and demonstrates:  Was initially in a fib with RVR rates decreased from 140's to 90's. At 3:15 converted to sinus, converted back to a fib at 5 am. Converted to sinus at about 7:45 and at about 11 AM appeared to be going back into A-fib.  Relevant CV Studies: Echo pending  Laboratory Data: High Sensitivity Troponin:   Recent Labs  Lab 07/26/23 0625  TROPONINIHS 25*      Chemistry Recent Labs  Lab 07/25/23 1820 07/26/23 0309  NA 140 138  K 3.8 3.4*  CL 100 104  CO2 27 27  GLUCOSE 125* 163*  BUN 26* 24*  CREATININE 1.17* 0.97  CALCIUM 9.3 8.6*  MG 1.9 2.1  GFRNONAA 48* >60  ANIONGAP 13 7    Recent Labs  Lab 07/25/23 1820  PROT 6.7  ALBUMIN 3.9  AST 13*  ALT 5  ALKPHOS 72  BILITOT 0.4   Lipids No results for input(s): "CHOL", "TRIG", "HDL", "LABVLDL", "LDLCALC", "CHOLHDL" in the last 168 hours.  Hematology Recent Labs  Lab 07/25/23 1820 07/26/23 0309  WBC 14.1* 13.5*  RBC 4.64 4.58  HGB 12.0 11.7*  HCT 38.7 38.6  MCV 83.4 84.3  MCH 25.9* 25.5*  MCHC 31.0 30.3  RDW 16.3* 16.3*  PLT 304 274   Thyroid   Recent Labs  Lab 07/26/23 0309  TSH 1.307    BNP Recent Labs  Lab 07/25/23 1820  PROBNP 2,279.0*    DDimer No results for input(s): "DDIMER" in the last 168 hours.  Radiology/Studies:  DG Chest Port 1 View Result Date: 07/25/2023 CLINICAL DATA:  Shortness of breath EXAM: PORTABLE CHEST 1 VIEW COMPARISON:  11/14/2017 FINDINGS: Heart and mediastinal contours are within normal limits. No focal opacities or effusions. No acute bony abnormality. IMPRESSION: No active disease. Electronically Signed   By: Janeece Mechanic M.D.   On: 07/25/2023 20:09     Assessment and Plan:  Sarah Miller is a 78 y.o. female with a hx of coronary artery disease, right bundle branch block, obstructive sleep apnea, anxiety, depression, prior tobacco use, COPD, early menopause, hyperlipidemia, and diabetes mellitus who is being seen 07/26/2023 for the evaluation of atrial fibrillation at the request of Katrina Parma DO.  New onset atrial fibrillation Recent falls Hypokalemia CHA2DS2-VASc Score = 6 [CHF History: 0, HTN History: 1, Diabetes History: 1, Stroke History: 0, Vascular Disease History: 1, Age Score: 2, Gender Score: 1].  Therefore, the patient's annual risk of stroke is 9.7 %.    Is symptomatic and has fatigue, shortness of breath, and  palpitations. Was started on IV Cardizem, Lovenox. This morning Cardizem was held due to hypotension and metoprolol  tartrate 12.5 mg BID. and rates have improved from the 140s to the 90s.  This morning has been converting in and out of atrial fibrillation. -TSH normal, magnesium normal, hypokalemia potassium 3.4 on 07/26/2023.  Has received an oral potassium. Echo pending. - Stop IV Lovenox and start Eliquis 5 mg BID - Is a poor candidate for cardioversion due to spontaneous conversion in and out of A-fib. - Due to how symptomatic she has been, low blood pressures and multiple spontaneous conversations start IV and bolus amiodarone. - Is a poor candidate for starting DOAC due to having 1 mechanical fall and 1 near fall in the past 2 to 3 weeks and the  patient lives alone. - Plan for outpatient follow-up with A-fib clinic.  Shortness of breath Elevated BNP 2279 Chest x-ray showed no focal opacities or effusions. Appeared euvolemic on exam Has a history of COPD but is requiring 2LNC that is no on at home.    Obstructive sleep apnea - Has not used CPAP for several years is currently working on trying to get mask fit. - consider inpatient CPAP.   Coronary artery disease Right bundle branch block Hyperlipidemia Elevated high-sensitivity troponins 42>38 Coronary CTA in 2021 showed minimal nonobstructive plaque in LAD and Lcx, but moderate plaque was found in proximal RCA.  Denies having chest pain. - Suspect that elevated and flat troponins are secondary to demand ischemia from A-fib with RVR - Continue atorvastatin 10 mg daily.   History of tobacco use COPD -Quit smoking about 6 years ago  Risk Assessment/Risk Scores:         CHA2DS2-VASc Score = 6   This indicates a 9.7% annual risk of stroke. The patient's score is based upon: CHF History: 0 HTN History: 1 Diabetes History: 1 Stroke History: 0 Vascular Disease History: 1 Age Score: 2 Gender Score: 1    For questions or  updates, please contact [facility] Please consult [email or web address] for contact info under    Signed, Melita Springer, PA-C  07/26/2023 10:37 AM

## 2023-07-26 NOTE — TOC Initial Note (Signed)
 Transition of Care [facility]) - Initial/Assessment Note    Patient Details  Name: Sarah Miller MRN: [id number] Date of Birth: 1945-11-27  [facility]) CM/SW Contact:    Amaryllis Junior, LCSW Phone Number: 07/26/2023, 10:01 AM  Clinical Narrative:                 Pt from home alone. Pt currently on 2L of O2. PT eval pending. Pt continues medical workup. TOC following for dc needs.     Barriers to Discharge: Continued Medical Work up   Patient Goals and CMS Choice Patient states their goals for this hospitalization and ongoing recovery are:: return home   Choice offered to / list presented to : NA      Expected Discharge Plan and Services In-house Referral: NA Discharge Planning Services: NA   Living arrangements for the past 2 months: Single Family Home                 DME Arranged: N/A DME Agency: NA       HH Arranged: NA HH Agency: NA        Prior Living Arrangements/Services Living arrangements for the past 2 months: Single Family Home Lives with:: Self Patient language and need for interpreter reviewed:: Yes Do you feel safe going back to the place where you live?: Yes      Need for Family Participation in Patient Care: Yes (Comment) Care giver support system in place?: Yes (comment)   Criminal Activity/Legal Involvement Pertinent to Current Situation/Hospitalization: No - Comment as needed  Activities of Daily Living   ADL Screening (condition at time of admission) Independently performs ADLs?: Yes (appropriate for developmental age) Is the patient deaf or have difficulty hearing?: No Does the patient have difficulty seeing, even when wearing glasses/contacts?: No Does the patient have difficulty concentrating, remembering, or making decisions?: Yes  Permission Sought/Granted                  Emotional Assessment Appearance:: Appears stated age Attitude/Demeanor/Rapport: Engaged Affect (typically observed): Accepting Orientation: :  Oriented to Self, Oriented to Place, Oriented to  Time, Oriented to Situation Alcohol / Substance Use: Not Applicable Psych Involvement: No (comment)  Admission diagnosis:  Rapid atrial fibrillation (HCC) [I48.91] Atrial fibrillation with RVR (HCC) [I48.91] Patient Active Problem List   Diagnosis Date Noted   DM (diabetes mellitus) (HCC) 07/26/2023   Rapid atrial fibrillation (HCC) 07/25/2023   COPD (chronic obstructive pulmonary disease) (HCC)    Aortic atherosclerosis (HCC)    Coronary artery calcification seen on CAT scan    DOE (dyspnea on exertion) 02/03/2019   Morbid obesity due to excess calories (HCC) 02/03/2019   Upper airway cough syndrome vs cough variant asthma 04/18/2018   RBBB 03/19/2013   PCP:  Victorio Grave, MD Pharmacy:   [id number] - [place], [place] - [address] [place] [address] Phone: [phone] Fax: [phone]  [place] [facility] [address] [place] [address] Phone: [phone] Fax: [phone]     Social Drivers of Health (SDOH) Social History: SDOH Screenings   Food Insecurity: No Food Insecurity (07/25/2023)  Housing: Low Risk  (07/25/2023)  Transportation Needs: No Transportation Needs (07/25/2023)  Utilities: Not At Risk (07/25/2023)  Social Connections: Socially Isolated (07/25/2023)  Tobacco Use: Medium Risk (12/21/2022)   SDOH Interventions:     Readmission Risk Interventions     No data to display

## 2023-07-26 NOTE — Telephone Encounter (Signed)
 Pharmacy Patient Advocate Encounter  Insurance verification completed.    The patient is insured through [employer]. Patient has Medicare and is not eligible for a copay card, but may be able to apply for patient assistance or Medicare RX Payment Plan (Patient Must reach out to their plan, if eligible for payment plan), if available.    Ran test claim for Eliquis and the current 30 day co-pay is $0.00.   This test claim was processed through [facility]- copay amounts may vary at other pharmacies due to pharmacy/plan contracts, or as the patient moves through the different stages of their insurance plan.

## 2023-07-26 NOTE — Plan of Care (Signed)
  Problem: Education: Goal: Knowledge of General Education information will improve Description: Including pain rating scale, medication(s)/side effects and non-pharmacologic comfort measures 07/26/2023 0725 by Tanja Fang, RN Outcome: Progressing 07/26/2023 0725 by Tanja Fang, RN Outcome: Progressing   Problem: Health Behavior/Discharge Planning: Goal: Ability to manage health-related needs will improve 07/26/2023 0725 by Tanja Fang, RN Outcome: Progressing 07/26/2023 0725 by Tanja Fang, RN Outcome: Progressing

## 2023-07-26 NOTE — H&P (Signed)
 History and Physical    Patient: Sarah Miller:[id number] DOB: 07/14/1945 DOA: 07/25/2023 DOS: the patient was seen and examined on 07/26/2023 PCP: Victorio Grave, MD  Patient coming from: Home  Chief Complaint:  Chief Complaint  Patient presents with   Shortness of Breath   HPI: Sarah Miller is a 78 y.o. female with medical history significant for type 2 diabetes mellitus, obstructive sleep apnea, COPD, hypertension, and microcytic anemia was sent to the ED from clinic because she was found to be in A-fib with RVR. The patient had an episode 2 days ago where she became quite short of breath.  Since that time she has had persistent shortness of breath, extreme fatigue, and dizziness.  She has also felt some strong fluttery heartbeats in her neck.  She denies any chest pain or fevers.  She went to her outpatient doctors office today because of the symptoms and was sent to the ED when they found that her heart was racing.  The patient has no history of heart trouble.   In the emergency department she was found to be in atrial fibrillation with RVR.  She required diltiazem drip to control her heart rate.  She was admitted to [facility] because of this.   Review of Systems: As mentioned in the history of present illness. All other systems reviewed and are negative. Past Medical History:  Diagnosis Date   Allergic rhinitis    Anxiety    Aortic atherosclerosis (HCC)    Cancer (HCC)    h/o melanoma '78 and 79   Cervical arthritis    Dr Jolinda Necessary, S/P injection with good results.    COPD (chronic obstructive pulmonary disease) (HCC)    with emphysema   Coronary artery calcification seen on CAT scan    Depression    and anxiety   Diabetes mellitus without complication (HCC)    Early menopause    Frozen shoulder    H/O frozen shoulder and rotator cuff tendonititis, L s/p injection   GERD (gastroesophageal reflux disease)    PONV (postoperative nausea and  vomiting)    Sleep apnea    Vitamin D deficiency    Past Surgical History:  Procedure Laterality Date   ABDOMINAL HYSTERECTOMY     CATARACT EXTRACTION W/ INTRAOCULAR LENS IMPLANT Bilateral    FRACTURE SURGERY Left    wrist   MELANOMA EXCISION     REVERSE SHOULDER ARTHROPLASTY Left 12/28/2019   Procedure: REVERSE SHOULDER ARTHROPLASTY;  Surgeon: Micheline Ahr, MD;  Location: [facility];  Service: Orthopedics;  Laterality: Left;   Social History:  reports that she quit smoking about 7 years ago. Her smoking use included cigarettes. She started smoking about 60 years ago. She has a 39.8 pack-year smoking history. She has never used smokeless tobacco. She reports current alcohol use. She reports that she does not use drugs.  Allergies  Allergen Reactions   Morphine And Codeine Nausea Only   Other     hydocan cough syrup causes nausea   Penicillins Rash    Tolerated Cephalosporin 12/28/2019.     Family History  Problem Relation Age of Onset   Cancer Mother        ovarian cancer   Hypertension Father    Diabetes Father    Heart attack Father    Cancer Father        testicular   CAD Father    Heart disease Father    Hyperlipidemia Father  Heart attack Brother 34   CAD Brother    Heart disease Brother    CAD Paternal Grandfather    Hypertension Sister    Cancer Other    Hyperlipidemia Other    Heart disease Other     Prior to Admission medications   Medication Sig Start Date End Date Taking? Authorizing Provider  ARNUITY ELLIPTA  200 MCG/ACT AEPB Inhale 1 puff into the lungs daily. 11/13/19   [provider]  atorvastatin (LIPITOR) 10 MG tablet Take 10 mg by mouth daily. 09/02/17   [provider]  buPROPion (WELLBUTRIN XL) 300 MG 24 hr tablet Take 300 mg by mouth daily.     [provider]  ferrous sulfate 325 (65 FE) MG EC tablet Take 325 mg by mouth 2 (two) times daily with a meal.    [provider]  fluticasone  (FLONASE ) 50 MCG/ACT nasal  spray SPRAY 2 SPRAYS INTO EACH NOSTRIL EVERY DAY 03/17/20   Olalere, Ona Bidding A, MD  Insulin Glargine (BASAGLAR KWIKPEN) 100 UNIT/ML Inject 45 Units into the skin daily. 10/28/19   [provider]  loratadine (CLARITIN) 10 MG tablet Take 10 mg by mouth daily. Patient not taking: Reported on 03/23/2022 11/26/19   [provider]  metFORMIN (GLUCOPHAGE-XR) 500 MG 24 hr tablet Take 500 mg by mouth in the morning and at bedtime. 500 mg in morning and 500 mg at night 10/16/17   [provider]  metoprolol  tartrate (LOPRESSOR ) 100 MG tablet TAKE 1 TABLET BY MOUTH TWICE DAILY 12/25/19 12/22/21  Kroeger, Krista M., PA-C  montelukast  (SINGULAIR ) 10 MG tablet TAKE 1 TABLET BY MOUTH EVERYDAY AT BEDTIME 02/08/20   Wert, Michael B, MD  nystatin (MYCOSTATIN/NYSTOP) powder Apply 1 application topically 2 (two) times daily as needed (yeast).  11/27/19   [provider]  OZEMPIC, 0.25 OR 0.5 MG/DOSE, 2 MG/3ML SOPN Inject into the skin.    [provider]  sertraline (ZOLOFT) 50 MG tablet Take 50 mg by mouth daily. 11/02/19   [provider]  valsartan-hydrochlorothiazide (DIOVAN-HCT) 160-25 MG tablet Take 1 tablet by mouth daily.    [provider]    Physical Exam: Vitals:   07/25/23 2100 07/25/23 2115 07/25/23 2200 07/25/23 2332  BP: 97/72 102/68 (!) 133/93   Pulse: (!) 111 (!) 109 (!) 109   Resp: 20 (!) 24 14   Temp: 98.5 F (36.9 C)  98.5 F (36.9 C) 98 F (36.7 C)  TempSrc: Oral  Oral Oral  SpO2: 93% 93% 94%   Weight:   103.7 kg   Height:   5\' 1"  (1.549 m)    Physical Exam:  General: No acute distress, well developed, well nourished HEENT: Normocephalic, atraumatic, PERRL Cardiovascular: irregular rhythm. Distal pulses intact. Pulmonary: Normal pulmonary effort, diminished breath sounds Gastrointestinal: Nondistended abdomen, soft, non-tender, hypoactive bowel sounds Musculoskeletal:Normal ROM, no lower ext edema Lymphadenopathy: No  cervical LAD. Neuro: No focal deficits noted, AAOx3. PSYCH: Attentive and cooperative  Data Reviewed:  Results for orders placed or performed during the hospital encounter of 07/25/23 (from the past 24 hours)  CBC with Differential     Status: Abnormal   Collection Time: 07/25/23  6:20 PM  Result Value Ref Range   WBC 14.1 (H) 4.0 - 10.5 K/uL   RBC 4.64 3.87 - 5.11 MIL/uL   Hemoglobin 12.0 12.0 - 15.0 g/dL   HCT 16.1 09.6 - 04.5 %   MCV 83.4 80.0 - 100.0 fL   MCH 25.9 (L) 26.0 - 34.0  pg   MCHC 31.0 30.0 - 36.0 g/dL   RDW 16.1 (H) 09.6 - 04.5 %   Platelets 304 150 - 400 K/uL   nRBC 0.0 0.0 - 0.2 %   Neutrophils Relative % 73 %   Neutro Abs 10.3 (H) 1.7 - 7.7 K/uL   Lymphocytes Relative 17 %   Lymphs Abs 2.4 0.7 - 4.0 K/uL   Monocytes Relative 7 %   Monocytes Absolute 1.0 0.1 - 1.0 K/uL   Eosinophils Relative 2 %   Eosinophils Absolute 0.2 0.0 - 0.5 K/uL   Basophils Relative 0 %   Basophils Absolute 0.1 0.0 - 0.1 K/uL   Immature Granulocytes 1 %   Abs Immature Granulocytes 0.08 (H) 0.00 - 0.07 K/uL  Troponin T, High Sensitivity     Status: Abnormal   Collection Time: 07/25/23  6:20 PM  Result Value Ref Range   Troponin T High Sensitivity 42 (H) <19 ng/L  Comprehensive metabolic panel     Status: Abnormal   Collection Time: 07/25/23  6:20 PM  Result Value Ref Range   Sodium 140 135 - 145 mmol/L   Potassium 3.8 3.5 - 5.1 mmol/L   Chloride 100 98 - 111 mmol/L   CO2 27 22 - 32 mmol/L   Glucose, Bld 125 (H) 70 - 99 mg/dL   BUN 26 (H) 8 - 23 mg/dL   Creatinine, Ser 4.09 (H) 0.44 - 1.00 mg/dL   Calcium 9.3 8.9 - 81.1 mg/dL   Total Protein 6.7 6.5 - 8.1 g/dL   Albumin 3.9 3.5 - 5.0 g/dL   AST 13 (L) 15 - 41 U/L   ALT 5 0 - 44 U/L   Alkaline Phosphatase 72 38 - 126 U/L   Total Bilirubin 0.4 0.0 - 1.2 mg/dL   GFR, Estimated 48 (L) >60 mL/min   Anion gap 13 5 - 15  Pro Brain natriuretic peptide     Status: Abnormal   Collection Time: 07/25/23  6:20 PM  Result Value Ref  Range   Pro Brain Natriuretic Peptide 2,279.0 (H) <300.0 pg/mL  Magnesium     Status: None   Collection Time: 07/25/23  6:20 PM  Result Value Ref Range   Magnesium 1.9 1.7 - 2.4 mg/dL  TSH     Status: None   Collection Time: 07/25/23  6:20 PM  Result Value Ref Range   TSH 2.470 0.350 - 4.500 uIU/mL  Troponin T, High Sensitivity     Status: Abnormal   Collection Time: 07/25/23  8:49 PM  Result Value Ref Range   Troponin T High Sensitivity 38 (H) <19 ng/L  Glucose, capillary     Status: Abnormal   Collection Time: 07/25/23 11:59 PM  Result Value Ref Range   Glucose-Capillary 190 (H) 70 - 99 mg/dL     Assessment and Plan: New onset atrial fibrillation with rapid ventricular response -Continue Cardizem drip as needed - Start Lovenox 1mg /kg - Echocardiogram,  ordered - Consider cardiology consult in a.m. - BNP is elevated, consider Lasix if sob does not improve  2. DMT2-  - Hold Metformin - She missed lunch and dinner today.  Last BS= 124. May hold evening insulin  3. H/o anemia - Hgb stable at 12.   Check stool Hemoccult since anticoagulation will be started  4. COPD -no wheezing at present.  Her shortness of breath was likely because of the atrial fibrillation.  - Will order as needed albuterol  nebs  5. OSA - CPAP?   Advance  Care Planning:   Code Status: Full Code  The patient names her daughter as her surrogate decision maker and she wants to be full code. Consults: None yet  Family Communication: Daughter at bedside  Severity of Illness: The appropriate patient status for this patient is INPATIENT. Inpatient status is judged to be reasonable and necessary in order to provide the required intensity of service to ensure the patient's safety. The patient's presenting symptoms, physical exam findings, and initial radiographic and laboratory data in the context of their chronic comorbidities is felt to place them at high risk for further clinical deterioration. Furthermore,  it is not anticipated that the patient will be medically stable for discharge from the hospital within 2 midnights of admission.   * I certify that at the point of admission it is my clinical judgment that the patient will require inpatient hospital care spanning beyond 2 midnights from the point of admission due to high intensity of service, high risk for further deterioration and high frequency of surveillance required.*  Author: Willadean Hark, MD 07/26/2023 12:03 AM  For on call review [email or web address].

## 2023-07-26 NOTE — H&P (Incomplete)
 History and Physical    Patient: Sarah Miller:[id number] DOB: [birth year] DOA: 07/25/2023 DOS: the patient was seen and examined on 07/26/2023 PCP: Victorio Grave, MD  Patient coming from: {Point_of_Origin:[address]}  Chief Complaint:  Chief Complaint  Patient presents with  . Shortness of Breath   HPI: Sarah Miller is a 78 y.o. female with medical history significant of ***  Review of Systems: {ROS_Text:26778} Past Medical History:  Diagnosis Date  . Allergic rhinitis   . Anxiety   . Aortic atherosclerosis (HCC)   . Cancer [facility])    h/o melanoma '78 and 79  . Cervical arthritis    Dr Jolinda Necessary, S/P injection with good results.   Aaron Aas COPD (chronic obstructive pulmonary disease) (HCC)    with emphysema  . Coronary artery calcification seen on CAT scan   . Depression    and anxiety  . Diabetes mellitus without complication (HCC)   . Early menopause   . Frozen shoulder    H/O frozen shoulder and rotator cuff tendonititis, L s/p injection  . GERD (gastroesophageal reflux disease)   . PONV (postoperative nausea and vomiting)   . Sleep apnea   . Vitamin D deficiency    Past Surgical History:  Procedure Laterality Date  . ABDOMINAL HYSTERECTOMY    . CATARACT EXTRACTION W/ INTRAOCULAR LENS IMPLANT Bilateral   . FRACTURE SURGERY Left    wrist  . MELANOMA EXCISION    . REVERSE SHOULDER ARTHROPLASTY Left 12/28/2019   Procedure: REVERSE SHOULDER ARTHROPLASTY;  Surgeon: Micheline Ahr, MD;  Location: [facility];  Service: Orthopedics;  Laterality: Left;   Social History:  reports that she quit smoking about 7 years ago. Her smoking use included cigarettes. She started smoking about 60 years ago. She has a 39.8 pack-year smoking history. She has never used smokeless tobacco. She reports current alcohol use. She reports that she does not use drugs.  Allergies  Allergen Reactions  . Morphine And Codeine Nausea Only  . Other     hydocan cough syrup causes nausea  . Penicillins  Rash    Tolerated Cephalosporin 12/28/2019.     Family History  Problem Relation Age of Onset  . Cancer Mother        ovarian cancer  . Hypertension Father   . Diabetes Father   . Heart attack Father   . Cancer Father        testicular  . CAD Father   . Heart disease Father   . Hyperlipidemia Father   . Heart attack Brother 52  . CAD Brother   . Heart disease Brother   . CAD Paternal Grandfather   . Hypertension Sister   . Cancer Other   . Hyperlipidemia Other   . Heart disease Other     Prior to Admission medications   Medication Sig Start Date End Date Taking? Authorizing Provider  ARNUITY ELLIPTA  200 MCG/ACT AEPB Inhale 1 puff into the lungs daily. 11/13/19   [provider]  atorvastatin (LIPITOR) 10 MG tablet Take 10 mg by mouth daily. 09/02/17   [provider]  buPROPion (WELLBUTRIN XL) 300 MG 24 hr tablet Take 300 mg by mouth daily.     [provider]  ferrous sulfate 325 (65 FE) MG EC tablet Take 325 mg by mouth 2 (two) times daily with a meal.    [provider]  fluticasone  (FLONASE ) 50 MCG/ACT nasal spray SPRAY 2 SPRAYS INTO EACH NOSTRIL EVERY DAY 03/17/20   Olalere, Adewale A,  MD  Insulin Glargine (BASAGLAR KWIKPEN) 100 UNIT/ML Inject 45 Units into the skin daily. 10/28/19   [provider]  loratadine (CLARITIN) 10 MG tablet Take 10 mg by mouth daily. Patient not taking: Reported on 03/23/2022 11/26/19   [provider]  metFORMIN (GLUCOPHAGE-XR) 500 MG 24 hr tablet Take 500 mg by mouth in the morning and at bedtime. 500 mg in morning and 500 mg at night 10/16/17   [provider]  metoprolol  tartrate (LOPRESSOR ) 100 MG tablet TAKE 1 TABLET BY MOUTH TWICE DAILY 12/25/19 12/22/21  Kroeger, Krista M., PA-C  montelukast  (SINGULAIR ) 10 MG tablet TAKE 1 TABLET BY MOUTH EVERYDAY AT BEDTIME 02/08/20   Wert, Michael B, MD  nystatin (MYCOSTATIN/NYSTOP) powder Apply 1 application topically 2 (two) times daily as needed  (yeast).  11/27/19   [provider]  OZEMPIC, 0.25 OR 0.5 MG/DOSE, 2 MG/3ML SOPN Inject into the skin.    [provider]  sertraline (ZOLOFT) 50 MG tablet Take 50 mg by mouth daily. 11/02/19   [provider]  valsartan-hydrochlorothiazide (DIOVAN-HCT) 160-25 MG tablet Take 1 tablet by mouth daily.    [provider]    Physical Exam: Vitals:   07/25/23 2100 07/25/23 2115 07/25/23 2200 07/25/23 2332  BP: 97/72 102/68 (!) 133/93   Pulse: (!) 111 (!) 109 (!) 109   Resp: 20 (!) 24 14   Temp: 98.5 F (36.9 C)  98.5 F (36.9 C) 98 F (36.7 C)  TempSrc: Oral  Oral Oral  SpO2: 93% 93% 94%   Weight:   103.7 kg   Height:   5\' 1"  (1.549 m)    *** Data Reviewed: {Tip this will not be part of the note when signed- Document your independent interpretation of telemetry tracing, EKG, lab, Radiology test or any other diagnostic tests. Add any new diagnostic test ordered today. (Optional):26781} {Results:26384}  Assessment and Plan: No notes have been filed under this hospital service. Service: Hospitalist     Advance Care Planning:   Code Status: Full Code ***  Consults: ***  Family Communication: ***  Severity of Illness: {Observation/Inpatient:21159}  Author: Willadean Hark, MD 07/26/2023 12:03 AM  For on call review [email or web address].

## 2023-07-26 NOTE — Progress Notes (Addendum)
 Patient admitted after midnight, please see H&P.  Here with a fib with RVR-- cards consult appreciated.  Awaiting echo for further adjustment of medications. PT eval placed with orthostatics ordered Katrina Parma DO

## 2023-07-26 NOTE — Progress Notes (Signed)
*  PRELIMINARY RESULTS* Echocardiogram 2D Echocardiogram has been performed.  Charlestine Rookstool D Velva Molinari 07/26/2023, 4:03 PM

## 2023-07-26 NOTE — Discharge Instructions (Signed)

## 2023-07-27 ENCOUNTER — Encounter (HOSPITAL_COMMUNITY): Payer: Self-pay | Admitting: Internal Medicine

## 2023-07-27 DIAGNOSIS — I4891 Unspecified atrial fibrillation: Secondary | ICD-10-CM | POA: Diagnosis not present

## 2023-07-27 LAB — BASIC METABOLIC PANEL WITH GFR
Anion gap: 6 (ref 5–15)
BUN: 27 mg/dL — ABNORMAL HIGH (ref 8–23)
CO2: 28 mmol/L (ref 22–32)
Calcium: 8.6 mg/dL — ABNORMAL LOW (ref 8.9–10.3)
Chloride: 102 mmol/L (ref 98–111)
Creatinine, Ser: 0.9 mg/dL (ref 0.44–1.00)
GFR, Estimated: >60 mL/min (ref >60)
Glucose, Bld: 184 mg/dL — ABNORMAL HIGH (ref 70–99)
Potassium: 3.8 mmol/L (ref 3.5–5.1)
Sodium: 136 mmol/L (ref 135–145)

## 2023-07-27 LAB — LIPID PANEL
Cholesterol: 126 mg/dL (ref 0–200)
HDL: 38 mg/dL — ABNORMAL LOW (ref >40)
LDL Cholesterol: 54 mg/dL (ref 0–99)
Total CHOL/HDL Ratio: 3.3 ratio
Triglycerides: 168 mg/dL — ABNORMAL HIGH (ref <150)
VLDL: 34 mg/dL (ref 0–40)

## 2023-07-27 LAB — GLUCOSE, CAPILLARY
Glucose-Capillary: 189 mg/dL — ABNORMAL HIGH (ref 70–99)
Glucose-Capillary: 214 mg/dL — ABNORMAL HIGH (ref 70–99)
Glucose-Capillary: 163 mg/dL — ABNORMAL HIGH (ref 70–99)
Glucose-Capillary: 211 mg/dL — ABNORMAL HIGH (ref 70–99)

## 2023-07-27 LAB — CBC
HCT: 35.9 % — ABNORMAL LOW (ref 36.0–46.0)
Hemoglobin: 10.8 g/dL — ABNORMAL LOW (ref 12.0–15.0)
MCH: 25.8 pg — ABNORMAL LOW (ref 26.0–34.0)
MCHC: 30.1 g/dL (ref 30.0–36.0)
MCV: 85.9 fL (ref 80.0–100.0)
Platelets: 246 10*3/uL (ref 150–400)
RBC: 4.18 MIL/uL (ref 3.87–5.11)
RDW: 16.2 % — ABNORMAL HIGH (ref 11.5–15.5)
WBC: 10.6 10*3/uL — ABNORMAL HIGH (ref 4.0–10.5)
nRBC: 0 % (ref 0.0–0.2)

## 2023-07-27 MED ORDER — AMIODARONE HCL 200 MG PO TABS
400.0000 mg | ORAL_TABLET | Freq: Two times a day (BID) | ORAL | Status: DC
  Administered 2023-07-27 – 2023-07-29 (×5): 400 mg via ORAL
  Filled 2023-07-27 (×5): qty 2

## 2023-07-27 MED ORDER — BISACODYL 10 MG RE SUPP
10.0000 mg | Freq: Once | RECTAL | Status: DC
  Filled 2023-07-27: qty 1

## 2023-07-27 MED ORDER — DESVENLAFAXINE SUCCINATE ER 25 MG PO TB24: 25.0000 mg | ORAL_TABLET | Freq: Every day | ORAL | Status: DC

## 2023-07-27 MED ORDER — POLYETHYLENE GLYCOL 3350 17 G PO PACK
17.0000 g | PACK | Freq: Every day | ORAL | Status: DC
  Administered 2023-07-27 – 2023-07-28 (×2): 17 g via ORAL
  Filled 2023-07-27 (×3): qty 1

## 2023-07-27 MED ORDER — VENLAFAXINE HCL ER 37.5 MG PO CP24
37.5000 mg | ORAL_CAPSULE | Freq: Every day | ORAL | Status: DC
  Administered 2023-07-27 – 2023-07-28 (×2): 37.5 mg via ORAL
  Filled 2023-07-27 (×3): qty 1

## 2023-07-27 MED ORDER — NON FORMULARY: 25.0000 mg | Freq: Every day | Status: DC

## 2023-07-27 NOTE — Progress Notes (Signed)
 PROGRESS NOTE    Sarah Miller  ZOX:[id number] DOB: 07-06-1945 DOA: 07/25/2023 PCP: Victorio Grave, MD    Brief Narrative:  Sarah Miller is a 78 y.o. female with medical history significant for type 2 diabetes mellitus, obstructive sleep apnea, COPD, hypertension, and microcytic anemia was sent to the ED from clinic because she was found to be in A-fib with RVR. The patient had an episode 2 days ago where she became quite short of breath.  Since that time she has had persistent shortness of breath, extreme fatigue, and dizziness.  She has also felt some strong fluttery heartbeats in her neck.    Assessment and Plan: New onset atrial fibrillation with rapid ventricular response - Started low-dose metoprolol  - Placed on amiodarone  for low blood pressure - Eliquis  - Echocardiogram - Appreciate cardiology consultation  Leukocytosis - Trending down - No sign of infection  DMT2-  - Hold Metformin - Sliding scale insulin     H/o anemia - Hgb stable at 12.   -Trend Check stool Hemoccult since anticoagulation will be started   COPD -no wheezing at present.  Her shortness of breath was likely because of the atrial fibrillation   OSA -outpatient follow-up   Constipation - Bowel regimen with suppository  Obesity Estimated body mass index is 43.2 kg/m as calculated from the following:   Height as of this encounter: 5\' 1"  (1.549 m).   Weight as of this encounter: 103.7 kg.     DVT prophylaxis:  apixaban  (ELIQUIS ) tablet 5 mg    Code Status: Full Code Family Communication:   Disposition Plan:  Level of care: Progressive Status is: Inpatient     Consultants:     Subjective: No shortness of breath, having some constipation  Objective: Vitals:   07/26/23 1844 07/26/23 2155 07/27/23 0251 07/27/23 0602  BP: 104/67 103/60 (!) 119/57 106/64  Pulse: 73 77 71 73  Resp: 20 20 18 16   Temp: 99.1 F (37.3 C) 98.5 F (36.9 C) 97.9 F (36.6 C) 98 F (36.7 C)   TempSrc: Oral Oral Oral Oral  SpO2: 98% 96% 96% 95%  Weight:      Height:        Intake/Output Summary (Last 24 hours) at 07/27/2023 1012 Last data filed at 07/27/2023 0355 Gross per 24 hour  Intake 549.97 ml  Output 350 ml  Net 199.97 ml   Filed Weights   07/25/23 1821 07/25/23 2200  Weight: 104.8 kg 103.7 kg    Examination:   General: Appearance:    Obese female in no acute distress     Lungs:     respirations unlabored  Heart:    Normal heart rate.  Irregular   MS:   All extremities are intact.    Neurologic:   Awake, alert       Data Reviewed: I have personally reviewed following labs and imaging studies  CBC: Recent Labs  Lab 07/25/23 1820 07/26/23 0309 07/27/23 0557  WBC 14.1* 13.5* 10.6*  NEUTROABS 10.3*  --   --   HGB 12.0 11.7* 10.8*  HCT 38.7 38.6 35.9*  MCV 83.4 84.3 85.9  PLT 304 274 246   Basic Metabolic Panel: Recent Labs  Lab 07/25/23 1820 07/26/23 0309 07/27/23 0557  NA 140 138 136  K 3.8 3.4* 3.8  CL 100 104 102  CO2 27 27 28   GLUCOSE 125* 163* 184*  BUN 26* 24* 27*  CREATININE 1.17* 0.97 0.90  CALCIUM 9.3 8.6* 8.6*  MG 1.9 2.1  --  GFR: Estimated Creatinine Clearance: 59.9 mL/min (by C-G formula based on SCr of 0.9 mg/dL). Liver Function Tests: Recent Labs  Lab 07/25/23 1820  AST 13*  ALT 5  ALKPHOS 72  BILITOT 0.4  PROT 6.7  ALBUMIN 3.9   No results for input(s): "LIPASE", "AMYLASE" in the last 168 hours. No results for input(s): "AMMONIA" in the last 168 hours. Coagulation Profile: No results for input(s): "INR", "PROTIME" in the last 168 hours. Cardiac Enzymes: No results for input(s): "CKTOTAL", "CKMB", "CKMBINDEX", "TROPONINI" in the last 168 hours. BNP (last 3 results) Recent Labs    07/25/23 1820  PROBNP 2,279.0*   HbA1C: Recent Labs    07/26/23 0309  HGBA1C 6.9*   CBG: Recent Labs  Lab 07/26/23 0750 07/26/23 1213 07/26/23 1619 07/26/23 2151 07/27/23 0743  GLUCAP 133* 169* 180* 215* 189*    Lipid Profile: Recent Labs    07/27/23 0557  CHOL 126  HDL 38*  LDLCALC 54  TRIG 409*  CHOLHDL 3.3   Thyroid  Function Tests: Recent Labs    07/26/23 0309  TSH 1.307   Anemia Panel: No results for input(s): "VITAMINB12", "FOLATE", "FERRITIN", "TIBC", "IRON", "RETICCTPCT" in the last 72 hours. Sepsis Labs: No results for input(s): "PROCALCITON", "LATICACIDVEN" in the last 168 hours.  Recent Results (from the past 240 hours)  MRSA Next Gen by PCR, Nasal     Status: None   Collection Time: 07/25/23 10:24 PM   Specimen: Nasal Mucosa; Nasal Swab  Result Value Ref Range Status   MRSA by PCR Next Gen NOT DETECTED NOT DETECTED Final    Comment: (NOTE) The GeneXpert MRSA Assay (FDA approved for NASAL specimens only), is one component of a comprehensive MRSA colonization surveillance program. It is not intended to diagnose MRSA infection nor to guide or monitor treatment for MRSA infections. Test performance is not FDA approved in patients less than 61 years old. Performed at [facility], [address], [place], [place] [address]          Radiology Studies: ECHOCARDIOGRAM COMPLETE Result Date: 07/26/2023    ECHOCARDIOGRAM REPORT   Patient Name:   [name] Date of Exam: 07/26/2023 Medical Rec #:  [id number]       Height:       61.0 in Accession #:    [id number]      Weight:       228.6 lb Date of Birth:  11/05/1945       BSA:          1.999 m Patient Age:    77 years        BP:           108/55 mmHg Patient Gender: F               HR:           79 bpm. Exam Location:  Inpatient Procedure: 2D Echo, Cardiac Doppler and Color Doppler (Both Spectral and Color            Flow Doppler were utilized during procedure). Indications:    I48.91* Unspeicified atrial fibrillation  History:        Patient has prior history of Echocardiogram examinations, most                 recent 04/17/2013.  Sonographer:    Andrena Bang Referring Phys: [phone] CLAUDIA CLAIBORNE  IMPRESSIONS  1. Left ventricular ejection fraction, by estimation, is 60 to 65%. The left ventricle has normal  function. The left ventricle has no regional wall motion abnormalities. Left ventricular diastolic parameters are consistent with Grade I diastolic dysfunction (impaired relaxation).  2. Right ventricular systolic function is mildly reduced. The right ventricular size is normal. There is moderately elevated pulmonary artery systolic pressure. The estimated right ventricular systolic pressure is 55.3 mmHg.  3. The mitral valve is normal in structure. No evidence of mitral valve regurgitation. No evidence of mitral stenosis.  4. Tricuspid valve regurgitation is moderate.  5. The aortic valve is normal in structure. Aortic valve regurgitation is not visualized. No aortic stenosis is present.  6. The inferior vena cava is dilated in size with >50% respiratory variability, suggesting right atrial pressure of 8 mmHg. FINDINGS  Left Ventricle: Left ventricular ejection fraction, by estimation, is 60 to 65%. The left ventricle has normal function. The left ventricle has no regional wall motion abnormalities. Definity  contrast agent was given IV to delineate the left ventricular  endocardial borders. The left ventricular internal cavity size was normal in size. There is no left ventricular hypertrophy. Left ventricular diastolic parameters are consistent with Grade I diastolic dysfunction (impaired relaxation). Right Ventricle: The right ventricular size is normal. No increase in right ventricular wall thickness. Right ventricular systolic function is mildly reduced. There is moderately elevated pulmonary artery systolic pressure. The tricuspid regurgitant velocity is 3.44 m/s, and with an assumed right atrial pressure of 8 mmHg, the estimated right ventricular systolic pressure is 55.3 mmHg. Left Atrium: Left atrial size was normal in size. Right Atrium: Right atrial size was normal in size. Pericardium: There is  no evidence of pericardial effusion. Mitral Valve: The mitral valve is normal in structure. No evidence of mitral valve regurgitation. No evidence of mitral valve stenosis. Tricuspid Valve: The tricuspid valve is normal in structure. Tricuspid valve regurgitation is moderate . No evidence of tricuspid stenosis. Aortic Valve: The aortic valve is normal in structure. Aortic valve regurgitation is not visualized. No aortic stenosis is present. Aortic valve mean gradient measures 7.0 mmHg. Aortic valve peak gradient measures 13.4 mmHg. Pulmonic Valve: The pulmonic valve was normal in structure. Pulmonic valve regurgitation is not visualized. No evidence of pulmonic stenosis. Aorta: The aortic root is normal in size and structure. Venous: The inferior vena cava is dilated in size with greater than 50% respiratory variability, suggesting right atrial pressure of 8 mmHg. IAS/Shunts: No atrial level shunt detected by color flow Doppler.  LEFT VENTRICLE PLAX 2D LVIDd:         4.40 cm      Diastology LVIDs:         3.30 cm      LV e' medial:    6.96 cm/s LV PW:         1.20 cm      LV E/e' medial:  13.3 LV IVS:        0.80 cm      LV e' lateral:   5.44 cm/s LVOT diam:     1.80 cm      LV E/e' lateral: 17.0 LVOT Area:     2.54 cm  LV Volumes (MOD) LV vol d, MOD A2C: 127.0 ml LV vol d, MOD A4C: 109.0 ml LV vol s, MOD A2C: 42.9 ml LV vol s, MOD A4C: 35.6 ml LV SV MOD A2C:     84.1 ml LV SV MOD A4C:     109.0 ml LV SV MOD BP:      78.0 ml RIGHT VENTRICLE RV S prime:  10.00 cm/s TAPSE (M-mode): 1.8 cm LEFT ATRIUM             Index LA diam:        3.50 cm 1.75 cm/m LA Vol (A2C):   45.3 ml 22.66 ml/m LA Vol (A4C):   28.2 ml 14.11 ml/m LA Biplane Vol: 36.9 ml 18.46 ml/m  AORTIC VALVE AV Vmax:      183.00 cm/s AV Vmean:     124.000 cm/s AV VTI:       0.363 m AV Peak Grad: 13.4 mmHg AV Mean Grad: 7.0 mmHg  AORTA Ao Asc diam: 3.20 cm MITRAL VALVE               TRICUSPID VALVE MV Area (PHT): 3.95 cm    TR Peak grad:   47.3 mmHg  MV Decel Time: 192 msec    TR Vmax:        344.00 cm/s MV E velocity: 92.50 cm/s MV A velocity: 90.40 cm/s  SHUNTS MV E/A ratio:  1.02        Systemic Diam: 1.80 cm Arta Lark Electronically signed by Arta Lark Signature Date/Time: 07/26/2023/4:27:54 PM    Final    DG Chest Port 1 View Result Date: 07/25/2023 CLINICAL DATA:  Shortness of breath EXAM: PORTABLE CHEST 1 VIEW COMPARISON:  11/14/2017 FINDINGS: Heart and mediastinal contours are within normal limits. No focal opacities or effusions. No acute bony abnormality. IMPRESSION: No active disease. Electronically Signed   By: Janeece Mechanic M.D.   On: 07/25/2023 20:09        Scheduled Meds:  apixaban   5 mg Oral BID   bisacodyl  10 mg Rectal Once   buPROPion   300 mg Oral Daily   Chlorhexidine  Gluconate Cloth  6 each Topical QHS   desvenlafaxine  25 mg Oral Daily   insulin  aspart  0-6 Units Subcutaneous TID WC   metoprolol  tartrate  12.5 mg Oral BID   Continuous Infusions:  amiodarone  30 mg/hr (07/27/23 0004)     LOS: 1 day    Time spent: 55 minutes spent on chart review, discussion with nursing staff, consultants, updating family and interview/physical exam; more than 50% of that time was spent in counseling and/or coordination of care.    Enrigue Harvard, DO [facility] Available via Epic secure chat 7am-7pm After these hours, please refer to coverage provider listed on [email or web address] 07/27/2023, 10:12 AM

## 2023-07-27 NOTE — Evaluation (Signed)
 Occupational Therapy Evaluation Patient Details Name: Sarah Miller MRN: [id number] DOB: 05-30-1945 Today's Date: 07/27/2023   History of Present Illness   78 yr old female admitted with Afib, fatigue, DOE. PMH: COPD, CAD, DM, OSA, anemia, TSA 2021, falls, obesity     Clinical Impressions The pt is currently presenting with the below listed deficits (see OT problem list). She reported having ~5 falls over the past year, due to clumsiness. She also reported feelings of mild shortness of breath with activity, as well as compromised endurance. During the session, she required approximately CGA for tasks, including sit to stand, performing a toilet transfer at bathroom level, and for grooming at the sink. She would benefit from education on energy conservation strategies during self-care tasks. OT will continue to follow the pt for services in the acute care setting. The pt declines home health therapy services.      If plan is discharge home, recommend the following:   Help with stairs or ramp for entrance;Assistance with cooking/housework     Functional Status Assessment   Patient has had a recent decline in their functional status and demonstrates the ability to make significant improvements in function in a reasonable and predictable amount of time.     Equipment Recommendations   Tub/shower bench     Recommendations for Other Services         Precautions/Restrictions   Precautions Precautions: Fall Restrictions Weight Bearing Restrictions Per Provider Order: No     Mobility Bed Mobility Overal bed mobility: Needs Assistance Bed Mobility: Sit to Supine, Supine to Sit     Supine to sit: Supervision, Used rails, HOB elevated Sit to supine: Supervision        Transfers Overall transfer level: Needs assistance Equipment used: Rolling walker (2 wheels) Transfers: Sit to/from Stand Sit to Stand: Contact guard assist                  Balance      Sitting balance-Leahy Scale: Good         Standing balance comment: CGA              ADL either performed or assessed with clinical judgement   ADL Overall ADL's : Needs assistance/impaired Eating/Feeding: Independent;Sitting   Grooming: Contact guard assist;Standing Grooming Details (indicate cue type and reason): she performed hand washing at the sink.         Upper Body Dressing : Set up;Sitting   Lower Body Dressing: Minimal assistance   Toilet Transfer: Contact guard assist;Rolling walker (2 wheels);Ambulation Toilet Transfer Details (indicate cue type and reason): at bathroom level Toileting- Clothing Manipulation and Hygiene: Contact guard assist;Sit to/from stand               Vision   Additional Comments: SHe correctly read the time deoicted on the wall clock.            Pertinent Vitals/Pain Pain Assessment Pain Assessment: No/denies pain     Extremity/Trunk Assessment Upper Extremity Assessment Upper Extremity Assessment: Overall WFL for tasks assessed;Right hand dominant   Lower Extremity Assessment Lower Extremity Assessment: Overall WFL for tasks assessed   Cervical / Trunk Assessment Cervical / Trunk Assessment: Normal   Communication Communication Communication: No apparent difficulties   Cognition Arousal: Alert Behavior During Therapy: WFL for tasks assessed/performed      Following commands: Intact                  Home Living Family/patient expects to be  discharged to:: Private residence Living Arrangements: Alone Available Help at Discharge: Family;Available PRN/intermittently Type of Home: House Home Access: Stairs to enter [employer] of Steps: 2 Entrance Stairs-Rails: None Home Layout: One level     Bathroom Shower/Tub: Tub/shower unit         Home Equipment: Rollator (4 wheels);Toilet riser   Additional Comments: 3 wheeled walker      Prior Functioning/Environment Prior Level of  Function : Independent/Modified Independent             Mobility Comments: She used a rollator for ambulation. ADLs Comments: Her daughter assisted with cleaning, cooking, and laundry. The pt also required supervision for tub transfers, otherwise she managed ADLs without assistance. She did limited driving.    OT Problem List: Decreased activity tolerance;Impaired balance (sitting and/or standing);Cardiopulmonary status limiting activity;Decreased knowledge of use of DME or AE   OT Treatment/Interventions: Self-care/ADL training;Therapeutic exercise;Therapeutic activities;Energy conservation;DME and/or AE instruction;Patient/family education;Balance training      OT Goals(Current goals can be found in the care plan section)   Acute Rehab OT Goals OT Goal Formulation: With patient/family Time For Goal Achievement: 08/10/23 Potential to Achieve Goals: Good ADL Goals Pt Will Perform Grooming: with modified independence;standing Pt Will Perform Lower Body Dressing: with modified independence;sitting/lateral leans;sit to/from stand Pt Will Transfer to Toilet: with modified independence;ambulating Pt Will Perform Toileting - Clothing Manipulation and hygiene: with modified independence;sit to/from stand   OT Frequency:  Min 2X/week       AM-PAC OT "6 Clicks" Daily Activity     Outcome Measure Help from another person eating meals?: None Help from another person taking care of personal grooming?: None Help from another person toileting, which includes using toliet, bedpan, or urinal?: A Little Help from another person bathing (including washing, rinsing, drying)?: A Little Help from another person to put on and taking off regular upper body clothing?: A Little Help from another person to put on and taking off regular lower body clothing?: A Little 6 Click Score: 20   End of Session Equipment Utilized During Treatment: Rolling walker (2 wheels) Nurse Communication: Mobility  status  Activity Tolerance: Patient limited by fatigue Patient left: in bed;with call bell/phone within reach;with bed alarm set;with family/visitor present  OT Visit Diagnosis: Unsteadiness on feet (R26.81);History of falling (Z91.81);Dizziness and giddiness (R42)                Time: 6962-9528 OT Time Calculation (min): 23 min Charges:  OT General Charges $OT Visit: 1 Visit OT Evaluation $OT Eval Moderate Complexity: 1 Mod    Shanan Fitzpatrick L Aleza Pew, OTR/L 07/27/2023, 5:38 PM

## 2023-07-27 NOTE — Progress Notes (Addendum)
 Progress Note  Patient Name: Sarah Miller Date of Encounter: 07/27/2023  Primary Cardiologist:   Gaylyn Keas, MD   Subjective   She feels well.  She seems to be maintaining NSR.    Inpatient Medications    Scheduled Meds:  apixaban   5 mg Oral BID   bisacodyl  10 mg Rectal Once   buPROPion   300 mg Oral Daily   Chlorhexidine  Gluconate Cloth  6 each Topical QHS   desvenlafaxine  25 mg Oral Daily   insulin  aspart  0-6 Units Subcutaneous TID WC   metoprolol  tartrate  12.5 mg Oral BID   polyethylene glycol  17 g Oral Daily   Continuous Infusions:  amiodarone  30 mg/hr (07/27/23 0004)   PRN Meds: acetaminophen  **OR** acetaminophen , albuterol , melatonin, ondansetron  **OR** ondansetron  (ZOFRAN ) IV, mouth rinse   Vital Signs    Vitals:   07/26/23 1844 07/26/23 2155 07/27/23 0251 07/27/23 0602  BP: 104/67 103/60 (!) 119/57 106/64  Pulse: 73 77 71 73  Resp: 20 20 18 16   Temp: 99.1 F (37.3 C) 98.5 F (36.9 C) 97.9 F (36.6 C) 98 F (36.7 C)  TempSrc: Oral Oral Oral Oral  SpO2: 98% 96% 96% 95%  Weight:      Height:        Intake/Output Summary (Last 24 hours) at 07/27/2023 1105 Last data filed at 07/27/2023 0355 Gross per 24 hour  Intake 549.97 ml  Output 350 ml  Net 199.97 ml   Filed Weights   07/25/23 1821 07/25/23 2200  Weight: 104.8 kg 103.7 kg    Telemetry    NSR - Personally Reviewed  ECG    NA - Personally Reviewed  Physical Exam   GEN: No acute distress.   Neck: No  JVD Cardiac: RRR, no murmurs, rubs, or gallops.  Respiratory: Clear  to auscultation bilaterally. GI: Soft, nontender, non-distended  MS: No  edema; No deformity. Neuro:  Nonfocal  Psych: Normal affect   Labs    Chemistry Recent Labs  Lab 07/25/23 1820 07/26/23 0309 07/27/23 0557  NA 140 138 136  K 3.8 3.4* 3.8  CL 100 104 102  CO2 27 27 28   GLUCOSE 125* 163* 184*  BUN 26* 24* 27*  CREATININE 1.17* 0.97 0.90  CALCIUM 9.3 8.6* 8.6*  PROT 6.7  --   --   ALBUMIN  3.9  --   --   AST 13*  --   --   ALT 5  --   --   ALKPHOS 72  --   --   BILITOT 0.4  --   --   GFRNONAA 48* >60 >60  ANIONGAP 13 7 6      Hematology Recent Labs  Lab 07/25/23 1820 07/26/23 0309 07/27/23 0557  WBC 14.1* 13.5* 10.6*  RBC 4.64 4.58 4.18  HGB 12.0 11.7* 10.8*  HCT 38.7 38.6 35.9*  MCV 83.4 84.3 85.9  MCH 25.9* 25.5* 25.8*  MCHC 31.0 30.3 30.1  RDW 16.3* 16.3* 16.2*  PLT 304 274 246    Cardiac EnzymesNo results for input(s): "TROPONINI" in the last 168 hours. No results for input(s): "TROPIPOC" in the last 168 hours.   BNP Recent Labs  Lab 07/25/23 1820  PROBNP 2,279.0*     DDimer No results for input(s): "DDIMER" in the last 168 hours.   Radiology    ECHOCARDIOGRAM COMPLETE Result Date: 07/26/2023    ECHOCARDIOGRAM REPORT   Patient Name:   SHANECA ORNE Date of Exam: 07/26/2023 Medical  Rec #:  [id number]       Height:       61.0 in Accession #:    [id number]      Weight:       228.6 lb Date of Birth:  12/04/1945       BSA:          1.999 m Patient Age:    78 years        BP:           108/55 mmHg Patient Gender: F               HR:           79 bpm. Exam Location:  Inpatient Procedure: 2D Echo, Cardiac Doppler and Color Doppler (Both Spectral and Color            Flow Doppler were utilized during procedure). Indications:    I48.91* Unspeicified atrial fibrillation  History:        Patient has prior history of Echocardiogram examinations, most                 recent 04/17/2013.  Sonographer:    Andrena Bang Referring Phys: [phone] CLAUDIA CLAIBORNE IMPRESSIONS  1. Left ventricular ejection fraction, by estimation, is 60 to 65%. The left ventricle has normal function. The left ventricle has no regional wall motion abnormalities. Left ventricular diastolic parameters are consistent with Grade I diastolic dysfunction (impaired relaxation).  2. Right ventricular systolic function is mildly reduced. The right ventricular size is normal. There is moderately elevated pulmonary  artery systolic pressure. The estimated right ventricular systolic pressure is 55.3 mmHg.  3. The mitral valve is normal in structure. No evidence of mitral valve regurgitation. No evidence of mitral stenosis.  4. Tricuspid valve regurgitation is moderate.  5. The aortic valve is normal in structure. Aortic valve regurgitation is not visualized. No aortic stenosis is present.  6. The inferior vena cava is dilated in size with >50% respiratory variability, suggesting right atrial pressure of 8 mmHg. FINDINGS  Left Ventricle: Left ventricular ejection fraction, by estimation, is 60 to 65%. The left ventricle has normal function. The left ventricle has no regional wall motion abnormalities. Definity  contrast agent was given IV to delineate the left ventricular  endocardial borders. The left ventricular internal cavity size was normal in size. There is no left ventricular hypertrophy. Left ventricular diastolic parameters are consistent with Grade I diastolic dysfunction (impaired relaxation). Right Ventricle: The right ventricular size is normal. No increase in right ventricular wall thickness. Right ventricular systolic function is mildly reduced. There is moderately elevated pulmonary artery systolic pressure. The tricuspid regurgitant velocity is 3.44 m/s, and with an assumed right atrial pressure of 8 mmHg, the estimated right ventricular systolic pressure is 55.3 mmHg. Left Atrium: Left atrial size was normal in size. Right Atrium: Right atrial size was normal in size. Pericardium: There is no evidence of pericardial effusion. Mitral Valve: The mitral valve is normal in structure. No evidence of mitral valve regurgitation. No evidence of mitral valve stenosis. Tricuspid Valve: The tricuspid valve is normal in structure. Tricuspid valve regurgitation is moderate . No evidence of tricuspid stenosis. Aortic Valve: The aortic valve is normal in structure. Aortic valve regurgitation is not visualized. No aortic  stenosis is present. Aortic valve mean gradient measures 7.0 mmHg. Aortic valve peak gradient measures 13.4 mmHg. Pulmonic Valve: The pulmonic valve was normal in structure. Pulmonic valve regurgitation is not visualized. No evidence of pulmonic stenosis. Aorta:  The aortic root is normal in size and structure. Venous: The inferior vena cava is dilated in size with greater than 50% respiratory variability, suggesting right atrial pressure of 8 mmHg. IAS/Shunts: No atrial level shunt detected by color flow Doppler.  LEFT VENTRICLE PLAX 2D LVIDd:         4.40 cm      Diastology LVIDs:         3.30 cm      LV e' medial:    6.96 cm/s LV PW:         1.20 cm      LV E/e' medial:  13.3 LV IVS:        0.80 cm      LV e' lateral:   5.44 cm/s LVOT diam:     1.80 cm      LV E/e' lateral: 17.0 LVOT Area:     2.54 cm  LV Volumes (MOD) LV vol d, MOD A2C: 127.0 ml LV vol d, MOD A4C: 109.0 ml LV vol s, MOD A2C: 42.9 ml LV vol s, MOD A4C: 35.6 ml LV SV MOD A2C:     84.1 ml LV SV MOD A4C:     109.0 ml LV SV MOD BP:      78.0 ml RIGHT VENTRICLE RV S prime:     10.00 cm/s TAPSE (M-mode): 1.8 cm LEFT ATRIUM             Index LA diam:        3.50 cm 1.75 cm/m LA Vol (A2C):   45.3 ml 22.66 ml/m LA Vol (A4C):   28.2 ml 14.11 ml/m LA Biplane Vol: 36.9 ml 18.46 ml/m  AORTIC VALVE AV Vmax:      183.00 cm/s AV Vmean:     124.000 cm/s AV VTI:       0.363 m AV Peak Grad: 13.4 mmHg AV Mean Grad: 7.0 mmHg  AORTA Ao Asc diam: 3.20 cm MITRAL VALVE               TRICUSPID VALVE MV Area (PHT): 3.95 cm    TR Peak grad:   47.3 mmHg MV Decel Time: 192 msec    TR Vmax:        344.00 cm/s MV E velocity: 92.50 cm/s MV A velocity: 90.40 cm/s  SHUNTS MV E/A ratio:  1.02        Systemic Diam: 1.80 cm Arta Lark Electronically signed by Arta Lark Signature Date/Time: 07/26/2023/4:27:54 PM    Final    DG Chest Port 1 View Result Date: 07/25/2023 CLINICAL DATA:  Shortness of breath EXAM: PORTABLE CHEST 1 VIEW COMPARISON:  11/14/2017 FINDINGS:  Heart and mediastinal contours are within normal limits. No focal opacities or effusions. No acute bony abnormality. IMPRESSION: No active disease. Electronically Signed   By: Janeece Mechanic M.D.   On: 07/25/2023 20:09    Cardiac Studies   See above  Patient Profile     78 y.o. female  with a hx of coronary artery disease, right bundle branch block, obstructive sleep apnea, anxiety, depression, prior tobacco use, COPD, early menopause, hyperlipidemia, hypertension, and diabetes mellitus who is being seen 07/26/2023 for the evaluation of atrial fibrillation at the request of Katrina Parma DO.   Assessment & Plan    Atrial fibrillation: This is new onset.  CHA2DS2-VASc is 6.  Eliquis  started.  She has had paroxysms that she does not need cardioversion.  She was treated with bolus and IV of amiodarone .  She is a poor long-term candidate for anticoagulation we can consider other management posthospitalization.  When discharged it will be on DOAC. I will change to PO amiodarone  today.    Shortness of breath: Probably multifactorial with chronic lung disease.  Elevated troponin: No suspicion of acute coronary syndrome.  No ischemia workup this admission.  For questions or updates, please contact [facility] Please consult [email or web address] for contact info under Cardiology/STEMI.   Signed, Eilleen Grates, MD  07/27/2023, 11:05 AM

## 2023-07-27 NOTE — Evaluation (Signed)
 Physical Therapy Evaluation Patient Details Name: Sarah Miller MRN: [id number] DOB: 1946/01/26 Today's Date: 07/27/2023  History of Present Illness  78 yo female admitted with Afib, fatigue, DOE. Hx of COPD, CAD, DM, OSA, anemia, TSA 2021, falls, obesity  Clinical Impression  On eval, pt was Min A for mobility. She walked ~75 feet with a RW. Pt tolerated activity fairly well. HR 85 bpm, O2 95% on RA, dyspnea 2/4 with ambulation. Will plan to follow pt during this hospital stay. Discussed d/c plan-pt plans to return to her home where she lives alone. She could benefit from HHPT f/u if agreeable.         If plan is discharge home, recommend the following: A little help with walking and/or transfers;A little help with bathing/dressing/bathroom;Assistance with cooking/housework;Assist for transportation;Help with stairs or ramp for entrance   Can travel by private vehicle        Equipment Recommendations None recommended by PT  Recommendations for Other Services       Functional Status Assessment Patient has had a recent decline in their functional status and demonstrates the ability to make significant improvements in function in a reasonable and predictable amount of time.     Precautions / Restrictions Precautions Precautions: Fall Restrictions Weight Bearing Restrictions Per Provider Order: No      Mobility  Bed Mobility Overal bed mobility: Needs Assistance Bed Mobility: Supine to Sit, Sit to Supine     Supine to sit: Min assist, HOB elevated Sit to supine: Contact guard assist, HOB elevated, Used rails   General bed mobility comments: HHA for trunk to upright. Increased time.    Transfers Overall transfer level: Needs assistance Equipment used: Rolling walker (2 wheels) Transfers: Sit to/from Stand Sit to Stand: Contact guard assist           General transfer comment: Cues for safety, hand placement.    Ambulation/Gait Ambulation/Gait assistance: Min  assist Gait Distance (Feet): 75 Feet Assistive device: Rolling walker (2 wheels) Gait Pattern/deviations: Step-through pattern, Decreased stride length       General Gait Details: Intermittent assist to steady but mostly CGA. Tolerated distance well. HR 85 bpm, O2 95% on RA, dyspnea 2/4.  Stairs            Wheelchair Mobility     Tilt Bed    Modified Rankin (Stroke Patients Only)       Balance Overall balance assessment: Needs assistance         Standing balance support: Bilateral upper extremity supported, Reliant on assistive device for balance Standing balance-Leahy Scale: Poor                               Pertinent Vitals/Pain Pain Assessment Pain Assessment: No/denies pain    Home Living Family/patient expects to be discharged to:: Private residence Living Arrangements: Alone Available Help at Discharge: Family;Available PRN/intermittently Type of Home: House Home Access: Stairs to enter Entrance Stairs-Rails: None Entrance Stairs-Number of Steps: 2   Home Layout: One level Home Equipment: Rollator (4 wheels) Additional Comments: 3 wheeled walker    Prior Function Prior Level of Function : Independent/Modified Independent                     Extremity/Trunk Assessment   Upper Extremity Assessment Upper Extremity Assessment: Defer to OT evaluation    Lower Extremity Assessment Lower Extremity Assessment: Generalized weakness    Cervical / Trunk Assessment  Cervical / Trunk Assessment: Normal  Communication   Communication Communication: No apparent difficulties    Cognition Arousal: Alert Behavior During Therapy: WFL for tasks assessed/performed   PT - Cognitive impairments: No apparent impairments                         Following commands: Intact       Cueing Cueing Techniques: Verbal cues     General Comments      Exercises     Assessment/Plan    PT Assessment Patient needs continued PT  services  PT Problem List Decreased strength;Decreased range of motion;Decreased activity tolerance;Decreased balance;Decreased mobility;Decreased knowledge of use of DME       PT Treatment Interventions DME instruction;Gait training;Functional mobility training;Therapeutic activities;Therapeutic exercise;Patient/family education;Balance training    PT Goals (Current goals can be found in the Care Plan section)  Acute Rehab PT Goals Patient Stated Goal: home PT Goal Formulation: With patient/family Time For Goal Achievement: 08/10/23 Potential to Achieve Goals: Good    Frequency Min 3X/week     Co-evaluation               AM-PAC PT "6 Clicks" Mobility  Outcome Measure Help needed turning from your back to your side while in a flat bed without using bedrails?: A Little Help needed moving from lying on your back to sitting on the side of a flat bed without using bedrails?: A Little Help needed moving to and from a bed to a chair (including a wheelchair)?: A Little Help needed standing up from a chair using your arms (e.g., wheelchair or bedside chair)?: A Little Help needed to walk in hospital room?: A Little Help needed climbing 3-5 steps with a railing? : A Little 6 Click Score: 18    End of Session Equipment Utilized During Treatment: Gait belt Activity Tolerance: Patient tolerated treatment well Patient left: in bed;with call bell/phone within reach;with bed alarm set;with family/visitor present   PT Visit Diagnosis: Muscle weakness (generalized) (M62.81);Difficulty in walking, not elsewhere classified (R26.2);History of falling (Z91.81)    Time: 8469-6295 PT Time Calculation (min) (ACUTE ONLY): 15 min   Charges:   PT Evaluation $PT Eval Low Complexity: 1 Low   PT General Charges $$ ACUTE PT VISIT: 1 Visit            Tanda Falter, PT Acute Rehabilitation  Office: [phone]

## 2023-07-27 NOTE — Progress Notes (Signed)
 Mobility Specialist - Progress Note   07/27/23 1400  Mobility  Activity Transferred from bed to chair  Level of Assistance Contact guard assist, steadying assist  Assistive Device None  Range of Motion/Exercises Active Assistive  Activity Response Tolerated well  Mobility Referral Yes  Mobility visit 1 Mobility  Mobility Specialist Start Time (ACUTE ONLY) 1345  Mobility Specialist Stop Time (ACUTE ONLY) 1350  Mobility Specialist Time Calculation (min) (ACUTE ONLY) 5 min   Pt was found getting up from recliner chair. Assisted with transfer to bed and was left with all needs met. Call bell in reach and NT in room.  Lorna Rose,  Mobility Specialist Can be reached via Secure Chat

## 2023-07-28 DIAGNOSIS — I4891 Unspecified atrial fibrillation: Secondary | ICD-10-CM | POA: Diagnosis not present

## 2023-07-28 LAB — CBC
HCT: 36.3 % (ref 36.0–46.0)
Hemoglobin: 11 g/dL — ABNORMAL LOW (ref 12.0–15.0)
MCH: 25.8 pg — ABNORMAL LOW (ref 26.0–34.0)
MCHC: 30.3 g/dL (ref 30.0–36.0)
MCV: 85.2 fL (ref 80.0–100.0)
Platelets: 242 10*3/uL (ref 150–400)
RBC: 4.26 MIL/uL (ref 3.87–5.11)
RDW: 15.9 % — ABNORMAL HIGH (ref 11.5–15.5)
WBC: 12.7 10*3/uL — ABNORMAL HIGH (ref 4.0–10.5)
nRBC: 0 % (ref 0.0–0.2)

## 2023-07-28 LAB — BASIC METABOLIC PANEL WITH GFR
Anion gap: 7 (ref 5–15)
BUN: 21 mg/dL (ref 8–23)
CO2: 27 mmol/L (ref 22–32)
Calcium: 8.8 mg/dL — ABNORMAL LOW (ref 8.9–10.3)
Chloride: 103 mmol/L (ref 98–111)
Creatinine, Ser: 0.93 mg/dL (ref 0.44–1.00)
GFR, Estimated: >60 mL/min (ref >60)
Glucose, Bld: 191 mg/dL — ABNORMAL HIGH (ref 70–99)
Potassium: 4 mmol/L (ref 3.5–5.1)
Sodium: 137 mmol/L (ref 135–145)

## 2023-07-28 LAB — GLUCOSE, CAPILLARY
Glucose-Capillary: 181 mg/dL — ABNORMAL HIGH (ref 70–99)
Glucose-Capillary: 156 mg/dL — ABNORMAL HIGH (ref 70–99)
Glucose-Capillary: 156 mg/dL — ABNORMAL HIGH (ref 70–99)
Glucose-Capillary: 171 mg/dL — ABNORMAL HIGH (ref 70–99)

## 2023-07-28 MED ORDER — FUROSEMIDE 20 MG PO TABS
20.0000 mg | ORAL_TABLET | Freq: Every day | ORAL | Status: DC
  Administered 2023-07-28: 20 mg via ORAL
  Filled 2023-07-28 (×2): qty 1

## 2023-07-28 NOTE — TOC Progression Note (Addendum)
 Transition of Care [facility]) - Progression Note    Patient Details  Name: Sarah Miller MRN: [id number] Date of Birth: [birth year]  [facility] CM/SW Contact  Levie Ream, RN Phone Number: 07/28/2023, 10:00 AM  Clinical Narrative:    Orders received for HHPT; spoke w/ pt and family in room; pt agreed to recc service; she does not have an agency preference; referral given to Shelvy Dickens at [place]; awaiting decision.  [phone]- referral given to [facility] at [facility]; awaiting decision.  [phone]- notified by Shelvy Dickens at [place] that agency can provide service; pt notified; agency contact info placed in follow up provider section of d/c instructions.   Barriers to Discharge: Continued Medical Work up  Expected Discharge Plan and Services In-house Referral: NA Discharge Planning Services: NA   Living arrangements for the past 2 months: Single Family Home                 DME Arranged: N/A DME Agency: NA       HH Arranged: NA HH Agency: NA         Social Determinants of Health (SDOH) Interventions SDOH Screenings   Food Insecurity: No Food Insecurity (07/25/2023)  Housing: Low Risk  (07/25/2023)  Transportation Needs: No Transportation Needs (07/25/2023)  Utilities: Not At Risk (07/25/2023)  Social Connections: Socially Isolated (07/25/2023)  Tobacco Use: Medium Risk (07/27/2023)    Readmission Risk Interventions     No data to display

## 2023-07-28 NOTE — Progress Notes (Signed)
 Progress Note  Patient Name: Sarah Miller Date of Encounter: 07/28/2023  Primary Cardiologist:   Gaylyn Keas, MD   Subjective   Breathing still not at baseline with ambulation.  No chest pain.  No palpitations.   Inpatient Medications    Scheduled Meds:  amiodarone   400 mg Oral BID   apixaban   5 mg Oral BID   bisacodyl  10 mg Rectal Once   buPROPion   300 mg Oral Daily   Chlorhexidine  Gluconate Cloth  6 each Topical QHS   insulin  aspart  0-6 Units Subcutaneous TID WC   metoprolol  tartrate  12.5 mg Oral BID   polyethylene glycol  17 g Oral Daily   venlafaxine  XR  37.5 mg Oral Q breakfast   Continuous Infusions:  PRN Meds: acetaminophen  **OR** acetaminophen , albuterol , melatonin, ondansetron  **OR** ondansetron  (ZOFRAN ) IV, mouth rinse   Vital Signs    Vitals:   07/26/23 2155 07/27/23 0251 07/27/23 0602 07/27/23 1157  BP: 103/60 (!) 119/57 106/64 128/62  Pulse: 77 71 73 70  Resp: 20 18 16 20   Temp: 98.5 F (36.9 C) 97.9 F (36.6 C) 98 F (36.7 C) 98.2 F (36.8 C)  TempSrc: Oral Oral Oral Oral  SpO2: 96% 96% 95% 96%  Weight:      Height:        Intake/Output Summary (Last 24 hours) at 07/28/2023 0925 Last data filed at 07/27/2023 2100 Gross per 24 hour  Intake 120 ml  Output 400 ml  Net -280 ml   Filed Weights   07/25/23 1821 07/25/23 2200  Weight: 104.8 kg 103.7 kg    Telemetry    PAF, rate controlled  - Personally Reviewed  ECG    NA - Personally Reviewed  Physical Exam   GEN: No acute distress.   Neck: No  JVD Cardiac: Irregular RR, no murmurs, rubs, or gallops.  Respiratory: Clear  to auscultation bilaterally. GI: Soft, nontender, non-distended  MS: No  edema; No deformity. Neuro:  Nonfocal  Psych: Normal affect   Labs    Chemistry Recent Labs  Lab 07/25/23 1820 07/26/23 0309 07/27/23 0557 07/28/23 0506  NA 140 138 136 137  K 3.8 3.4* 3.8 4.0  CL 100 104 102 103  CO2 27 27 28 27   GLUCOSE 125* 163* 184* 191*  BUN 26* 24*  27* 21  CREATININE 1.17* 0.97 0.90 0.93  CALCIUM 9.3 8.6* 8.6* 8.8*  PROT 6.7  --   --   --   ALBUMIN 3.9  --   --   --   AST 13*  --   --   --   ALT 5  --   --   --   ALKPHOS 72  --   --   --   BILITOT 0.4  --   --   --   GFRNONAA 48* >60 >60 >60  ANIONGAP 13 7 6 7      Hematology Recent Labs  Lab 07/26/23 0309 07/27/23 0557 07/28/23 0506  WBC 13.5* 10.6* 12.7*  RBC 4.58 4.18 4.26  HGB 11.7* 10.8* 11.0*  HCT 38.6 35.9* 36.3  MCV 84.3 85.9 85.2  MCH 25.5* 25.8* 25.8*  MCHC 30.3 30.1 30.3  RDW 16.3* 16.2* 15.9*  PLT 274 246 242    Cardiac EnzymesNo results for input(s): "TROPONINI" in the last 168 hours. No results for input(s): "TROPIPOC" in the last 168 hours.   BNP Recent Labs  Lab 07/25/23 1820  PROBNP 2,279.0*     DDimer  No results for input(s): "DDIMER" in the last 168 hours.   Radiology    ECHOCARDIOGRAM COMPLETE Result Date: 07/26/2023    ECHOCARDIOGRAM REPORT   Patient Name:   Sarah TONKINSON Date of Exam: 07/26/2023 Medical Rec #:  [id number]       Height:       61.0 in Accession #:    [id number]      Weight:       228.6 lb Date of Birth:  04/29/1945       BSA:          1.999 m Patient Age:    78 years        BP:           108/55 mmHg Patient Gender: F               HR:           79 bpm. Exam Location:  Inpatient Procedure: 2D Echo, Cardiac Doppler and Color Doppler (Both Spectral and Color            Flow Doppler were utilized during procedure). Indications:    I48.91* Unspeicified atrial fibrillation  History:        Patient has prior history of Echocardiogram examinations, most                 recent 04/17/2013.  Sonographer:    Andrena Bang Referring Phys: [phone] CLAUDIA CLAIBORNE IMPRESSIONS  1. Left ventricular ejection fraction, by estimation, is 60 to 65%. The left ventricle has normal function. The left ventricle has no regional wall motion abnormalities. Left ventricular diastolic parameters are consistent with Grade I diastolic dysfunction (impaired  relaxation).  2. Right ventricular systolic function is mildly reduced. The right ventricular size is normal. There is moderately elevated pulmonary artery systolic pressure. The estimated right ventricular systolic pressure is 55.3 mmHg.  3. The mitral valve is normal in structure. No evidence of mitral valve regurgitation. No evidence of mitral stenosis.  4. Tricuspid valve regurgitation is moderate.  5. The aortic valve is normal in structure. Aortic valve regurgitation is not visualized. No aortic stenosis is present.  6. The inferior vena cava is dilated in size with >50% respiratory variability, suggesting right atrial pressure of 8 mmHg. FINDINGS  Left Ventricle: Left ventricular ejection fraction, by estimation, is 60 to 65%. The left ventricle has normal function. The left ventricle has no regional wall motion abnormalities. Definity  contrast agent was given IV to delineate the left ventricular  endocardial borders. The left ventricular internal cavity size was normal in size. There is no left ventricular hypertrophy. Left ventricular diastolic parameters are consistent with Grade I diastolic dysfunction (impaired relaxation). Right Ventricle: The right ventricular size is normal. No increase in right ventricular wall thickness. Right ventricular systolic function is mildly reduced. There is moderately elevated pulmonary artery systolic pressure. The tricuspid regurgitant velocity is 3.44 m/s, and with an assumed right atrial pressure of 8 mmHg, the estimated right ventricular systolic pressure is 55.3 mmHg. Left Atrium: Left atrial size was normal in size. Right Atrium: Right atrial size was normal in size. Pericardium: There is no evidence of pericardial effusion. Mitral Valve: The mitral valve is normal in structure. No evidence of mitral valve regurgitation. No evidence of mitral valve stenosis. Tricuspid Valve: The tricuspid valve is normal in structure. Tricuspid valve regurgitation is moderate . No  evidence of tricuspid stenosis. Aortic Valve: The aortic valve is normal in structure. Aortic valve regurgitation is not visualized.  No aortic stenosis is present. Aortic valve mean gradient measures 7.0 mmHg. Aortic valve peak gradient measures 13.4 mmHg. Pulmonic Valve: The pulmonic valve was normal in structure. Pulmonic valve regurgitation is not visualized. No evidence of pulmonic stenosis. Aorta: The aortic root is normal in size and structure. Venous: The inferior vena cava is dilated in size with greater than 50% respiratory variability, suggesting right atrial pressure of 8 mmHg. IAS/Shunts: No atrial level shunt detected by color flow Doppler.  LEFT VENTRICLE PLAX 2D LVIDd:         4.40 cm      Diastology LVIDs:         3.30 cm      LV e' medial:    6.96 cm/s LV PW:         1.20 cm      LV E/e' medial:  13.3 LV IVS:        0.80 cm      LV e' lateral:   5.44 cm/s LVOT diam:     1.80 cm      LV E/e' lateral: 17.0 LVOT Area:     2.54 cm  LV Volumes (MOD) LV vol d, MOD A2C: 127.0 ml LV vol d, MOD A4C: 109.0 ml LV vol s, MOD A2C: 42.9 ml LV vol s, MOD A4C: 35.6 ml LV SV MOD A2C:     84.1 ml LV SV MOD A4C:     109.0 ml LV SV MOD BP:      78.0 ml RIGHT VENTRICLE RV S prime:     10.00 cm/s TAPSE (M-mode): 1.8 cm LEFT ATRIUM             Index LA diam:        3.50 cm 1.75 cm/m LA Vol (A2C):   45.3 ml 22.66 ml/m LA Vol (A4C):   28.2 ml 14.11 ml/m LA Biplane Vol: 36.9 ml 18.46 ml/m  AORTIC VALVE AV Vmax:      183.00 cm/s AV Vmean:     124.000 cm/s AV VTI:       0.363 m AV Peak Grad: 13.4 mmHg AV Mean Grad: 7.0 mmHg  AORTA Ao Asc diam: 3.20 cm MITRAL VALVE               TRICUSPID VALVE MV Area (PHT): 3.95 cm    TR Peak grad:   47.3 mmHg MV Decel Time: 192 msec    TR Vmax:        344.00 cm/s MV E velocity: 92.50 cm/s MV A velocity: 90.40 cm/s  SHUNTS MV E/A ratio:  1.02        Systemic Diam: 1.80 cm Arta Lark Electronically signed by Arta Lark Signature Date/Time: 07/26/2023/4:27:54 PM    Final      Cardiac Studies   See above.    Patient Profile     78 y.o. female with a hx of coronary artery disease, right bundle branch block, obstructive sleep apnea, anxiety, depression, prior tobacco use, COPD, early menopause, hyperlipidemia, hypertension, and diabetes mellitus who is being seen 07/26/2023 for the evaluation of atrial fibrillation at the request of Katrina Parma DO.   Assessment & Plan    Atrial fibrillation: Still with paroxysms.  I will plan the Amio at 400 mg bid x 2 weeks then 400 daily x 2 weeks then 200 mg.  Eliquis  and beta blocker as on MAR.  PRN Lasix 20 mg .  We will arrange follow up in or [facility] clinic.  Shortness of breath:     She did have an elevated BNP but no edema no CXR or exam.  There is some mild ankle edema.  I might suggest Lasix 20 mg PO daily for 3 day post discharge and prn.  Needs salt and fluid restriction.      Elevated troponin:   No suspicion for acute coronary syndrome.     For questions or updates, please contact [facility] Please consult [email or web address] for contact info under Cardiology/STEMI.   Signed, Eilleen Grates, MD  07/28/2023, 9:25 AM

## 2023-07-28 NOTE — Progress Notes (Signed)
 PROGRESS NOTE    [name]  NFA:[id number] DOB: [birth year] DOA: 07/25/2023 PCP: Victorio Grave, MD    Brief Narrative:  Sarah Miller is a 78 y.o. female with medical history significant for type 2 diabetes mellitus, obstructive sleep apnea, COPD, hypertension, and microcytic anemia was sent to the ED from clinic because she was found to be in A-fib with RVR. The patient had an episode 2 days ago where she became quite short of breath.  Since that time she has had persistent shortness of breath, extreme fatigue, and dizziness.  She has also felt some strong fluttery heartbeats in her neck.    Assessment and Plan: New onset atrial fibrillation with rapid ventricular response - Started low-dose metoprolol -- will hold for low BP - Placed on amiodarone  for low blood pressure - Eliquis  - Echocardiogram- EF 65 % - Appreciate cardiology consultation  Hypoxia -lasix PO x 1 -home O2 eval again in the AM  Leukocytosis - No sign of infection  DMT2-  - Hold Metformin - Sliding scale insulin     H/o anemia - Hgb stable at 12.   -Trend    COPD -no wheezing at present.  Her shortness of breath was likely because of the atrial fibrillation   OSA -outpatient follow-up   Constipation - Bowel regimen with suppository  Obesity Estimated body mass index is 43.2 kg/m as calculated from the following:   Height as of this encounter: 5\' 1"  (1.549 m).   Weight as of this encounter: 103.7 kg.     DVT prophylaxis:  apixaban  (ELIQUIS ) tablet 5 mg    Code Status: Full Code Family Communication: at bedside  Disposition Plan:  Level of care: Progressive Status is: Inpatient     Consultants:  cards   Subjective: Had some nausea this AM  Objective: Vitals:   07/27/23 1157 07/28/23 0900 07/28/23 0915 07/28/23 1124  BP: 128/62 (P) 107/66  (!) 102/57  Pulse: 70 (P) 70  68  Resp: 20 (P) 18  20  Temp: 98.2 F (36.8 C)   98.4 F (36.9 C)  TempSrc: Oral   Oral  SpO2: 96%  (P) 91% (P) 96% 98%  Weight:      Height:        Intake/Output Summary (Last 24 hours) at 07/28/2023 1229 Last data filed at 07/27/2023 2100 Gross per 24 hour  Intake --  Output 400 ml  Net -400 ml   Filed Weights   07/25/23 1821 07/25/23 2200  Weight: 104.8 kg 103.7 kg    Examination:   General: Appearance:    Obese female in no acute distress     Lungs:     respirations unlabored, diminished  Heart:    Normal heart rate.     MS:   All extremities are intact.    Neurologic:   Awake, alert       Data Reviewed: I have personally reviewed following labs and imaging studies  CBC: Recent Labs  Lab 07/25/23 1820 07/26/23 0309 07/27/23 0557 07/28/23 0506  WBC 14.1* 13.5* 10.6* 12.7*  NEUTROABS 10.3*  --   --   --   HGB 12.0 11.7* 10.8* 11.0*  HCT 38.7 38.6 35.9* 36.3  MCV 83.4 84.3 85.9 85.2  PLT 304 274 246 242   Basic Metabolic Panel: Recent Labs  Lab 07/25/23 1820 07/26/23 0309 07/27/23 0557 07/28/23 0506  NA 140 138 136 137  K 3.8 3.4* 3.8 4.0  CL 100 104 102 103  CO2 27 27 28  27  GLUCOSE 125* 163* 184* 191*  BUN 26* 24* 27* 21  CREATININE 1.17* 0.97 0.90 0.93  CALCIUM 9.3 8.6* 8.6* 8.8*  MG 1.9 2.1  --   --    GFR: Estimated Creatinine Clearance: 57.9 mL/min (by C-G formula based on SCr of 0.93 mg/dL). Liver Function Tests: Recent Labs  Lab 07/25/23 1820  AST 13*  ALT 5  ALKPHOS 72  BILITOT 0.4  PROT 6.7  ALBUMIN 3.9   No results for input(s): "LIPASE", "AMYLASE" in the last 168 hours. No results for input(s): "AMMONIA" in the last 168 hours. Coagulation Profile: No results for input(s): "INR", "PROTIME" in the last 168 hours. Cardiac Enzymes: No results for input(s): "CKTOTAL", "CKMB", "CKMBINDEX", "TROPONINI" in the last 168 hours. BNP (last 3 results) Recent Labs    07/25/23 1820  PROBNP 2,279.0*   HbA1C: Recent Labs    07/26/23 0309  HGBA1C 6.9*   CBG: Recent Labs  Lab 07/27/23 1159 07/27/23 1616 07/27/23 2146  07/28/23 0720 07/28/23 1120  GLUCAP 214* 163* 211* 181* 156*   Lipid Profile: Recent Labs    07/27/23 0557  CHOL 126  HDL 38*  LDLCALC 54  TRIG 914*  CHOLHDL 3.3   Thyroid  Function Tests: Recent Labs    07/26/23 0309  TSH 1.307   Anemia Panel: No results for input(s): "VITAMINB12", "FOLATE", "FERRITIN", "TIBC", "IRON", "RETICCTPCT" in the last 72 hours. Sepsis Labs: No results for input(s): "PROCALCITON", "LATICACIDVEN" in the last 168 hours.  Recent Results (from the past 240 hours)  MRSA Next Gen by PCR, Nasal     Status: None   Collection Time: 07/25/23 10:24 PM   Specimen: Nasal Mucosa; Nasal Swab  Result Value Ref Range Status   MRSA by PCR Next Gen NOT DETECTED NOT DETECTED Final    Comment: (NOTE) The GeneXpert MRSA Assay (FDA approved for NASAL specimens only), is one component of a comprehensive MRSA colonization surveillance program. It is not intended to diagnose MRSA infection nor to guide or monitor treatment for MRSA infections. Test performance is not FDA approved in patients less than 56 years old. Performed at [facility], [address], [place], [place] [address]          Radiology Studies: ECHOCARDIOGRAM COMPLETE Result Date: 07/26/2023    ECHOCARDIOGRAM REPORT   Patient Name:   Sarah Miller Date of Exam: 07/26/2023 Medical Rec #:  [id number]       Height:       61.0 in Accession #:    [id number]      Weight:       228.6 lb Date of Birth:  1945/04/19       BSA:          1.999 m Patient Age:    77 years        BP:           108/55 mmHg Patient Gender: F               HR:           79 bpm. Exam Location:  Inpatient Procedure: 2D Echo, Cardiac Doppler and Color Doppler (Both Spectral and Color            Flow Doppler were utilized during procedure). Indications:    I48.91* Unspeicified atrial fibrillation  History:        Patient has prior history of Echocardiogram examinations, most  recent 04/17/2013.  Sonographer:     Andrena Bang Referring Phys: [phone] CLAUDIA CLAIBORNE IMPRESSIONS  1. Left ventricular ejection fraction, by estimation, is 60 to 65%. The left ventricle has normal function. The left ventricle has no regional wall motion abnormalities. Left ventricular diastolic parameters are consistent with Grade I diastolic dysfunction (impaired relaxation).  2. Right ventricular systolic function is mildly reduced. The right ventricular size is normal. There is moderately elevated pulmonary artery systolic pressure. The estimated right ventricular systolic pressure is 55.3 mmHg.  3. The mitral valve is normal in structure. No evidence of mitral valve regurgitation. No evidence of mitral stenosis.  4. Tricuspid valve regurgitation is moderate.  5. The aortic valve is normal in structure. Aortic valve regurgitation is not visualized. No aortic stenosis is present.  6. The inferior vena cava is dilated in size with >50% respiratory variability, suggesting right atrial pressure of 8 mmHg. FINDINGS  Left Ventricle: Left ventricular ejection fraction, by estimation, is 60 to 65%. The left ventricle has normal function. The left ventricle has no regional wall motion abnormalities. Definity  contrast agent was given IV to delineate the left ventricular  endocardial borders. The left ventricular internal cavity size was normal in size. There is no left ventricular hypertrophy. Left ventricular diastolic parameters are consistent with Grade I diastolic dysfunction (impaired relaxation). Right Ventricle: The right ventricular size is normal. No increase in right ventricular wall thickness. Right ventricular systolic function is mildly reduced. There is moderately elevated pulmonary artery systolic pressure. The tricuspid regurgitant velocity is 3.44 m/s, and with an assumed right atrial pressure of 8 mmHg, the estimated right ventricular systolic pressure is 55.3 mmHg. Left Atrium: Left atrial size was normal in size. Right Atrium: Right  atrial size was normal in size. Pericardium: There is no evidence of pericardial effusion. Mitral Valve: The mitral valve is normal in structure. No evidence of mitral valve regurgitation. No evidence of mitral valve stenosis. Tricuspid Valve: The tricuspid valve is normal in structure. Tricuspid valve regurgitation is moderate . No evidence of tricuspid stenosis. Aortic Valve: The aortic valve is normal in structure. Aortic valve regurgitation is not visualized. No aortic stenosis is present. Aortic valve mean gradient measures 7.0 mmHg. Aortic valve peak gradient measures 13.4 mmHg. Pulmonic Valve: The pulmonic valve was normal in structure. Pulmonic valve regurgitation is not visualized. No evidence of pulmonic stenosis. Aorta: The aortic root is normal in size and structure. Venous: The inferior vena cava is dilated in size with greater than 50% respiratory variability, suggesting right atrial pressure of 8 mmHg. IAS/Shunts: No atrial level shunt detected by color flow Doppler.  LEFT VENTRICLE PLAX 2D LVIDd:         4.40 cm      Diastology LVIDs:         3.30 cm      LV e' medial:    6.96 cm/s LV PW:         1.20 cm      LV E/e' medial:  13.3 LV IVS:        0.80 cm      LV e' lateral:   5.44 cm/s LVOT diam:     1.80 cm      LV E/e' lateral: 17.0 LVOT Area:     2.54 cm  LV Volumes (MOD) LV vol d, MOD A2C: 127.0 ml LV vol d, MOD A4C: 109.0 ml LV vol s, MOD A2C: 42.9 ml LV vol s, MOD A4C: 35.6 ml LV SV MOD A2C:  84.1 ml LV SV MOD A4C:     109.0 ml LV SV MOD BP:      78.0 ml RIGHT VENTRICLE RV S prime:     10.00 cm/s TAPSE (M-mode): 1.8 cm LEFT ATRIUM             Index LA diam:        3.50 cm 1.75 cm/m LA Vol (A2C):   45.3 ml 22.66 ml/m LA Vol (A4C):   28.2 ml 14.11 ml/m LA Biplane Vol: 36.9 ml 18.46 ml/m  AORTIC VALVE AV Vmax:      183.00 cm/s AV Vmean:     124.000 cm/s AV VTI:       0.363 m AV Peak Grad: 13.4 mmHg AV Mean Grad: 7.0 mmHg  AORTA Ao Asc diam: 3.20 cm MITRAL VALVE               TRICUSPID VALVE  MV Area (PHT): 3.95 cm    TR Peak grad:   47.3 mmHg MV Decel Time: 192 msec    TR Vmax:        344.00 cm/s MV E velocity: 92.50 cm/s MV A velocity: 90.40 cm/s  SHUNTS MV E/A ratio:  1.02        Systemic Diam: 1.80 cm Arta Lark Electronically signed by Arta Lark Signature Date/Time: 07/26/2023/4:27:54 PM    Final         Scheduled Meds:  amiodarone   400 mg Oral BID   apixaban   5 mg Oral BID   bisacodyl  10 mg Rectal Once   buPROPion   300 mg Oral Daily   Chlorhexidine  Gluconate Cloth  6 each Topical QHS   furosemide  20 mg Oral Daily   insulin  aspart  0-6 Units Subcutaneous TID WC   polyethylene glycol  17 g Oral Daily   venlafaxine  XR  37.5 mg Oral Q breakfast   Continuous Infusions:     LOS: 2 days    Time spent: 55 minutes spent on chart review, discussion with nursing staff, consultants, updating family and interview/physical exam; more than 50% of that time was spent in counseling and/or coordination of care.    Enrigue Harvard, DO [facility] Available via Epic secure chat 7am-7pm After these hours, please refer to coverage provider listed on [email or web address] 07/28/2023, 12:29 PM

## 2023-07-28 NOTE — Progress Notes (Signed)
 Mobility Specialist - Progress Note Nurse requested Mobility Specialist to perform oxygen saturation test with pt which includes removing pt from oxygen both at rest and while ambulating.  Below are the results from that testing.     Patient Saturations on Room Air at Rest = spO2 91%  Patient Saturations on Room Air while Ambulating = sp02 87% .  Rested and performed pursed lip breathing for 1 minute with sp02 at 89%.  Patient Saturations on 2 Liters of oxygen while Ambulating = sp02 96%  At end of testing pt left in room on 2  Liters of oxygen.  Reported results to nurse.     07/28/23 1200  Mobility  Activity Ambulated with assistance in hallway  Level of Assistance Minimal assist, patient does 75% or more  Assistive Device Front wheel walker  Distance Ambulated (ft) 80 ft  Range of Motion/Exercises Active  Activity Response Tolerated well  Mobility Referral Yes  Mobility visit 1 Mobility  Mobility Specialist Start Time (ACUTE ONLY) 1130  Mobility Specialist Stop Time (ACUTE ONLY) 1145  Mobility Specialist Time Calculation (min) (ACUTE ONLY) 15 min   Pt was found in bed and agreeable to ambulate. Stated feeling a little SOB during ambulation and took x1 brief standing rest break due to SPO2 87%. Increased when placed on 2LNC. At EOS returned to bed with all needs met. Call bell in reach and daughter in room.  Lorna Rose,  Mobility Specialist Can be reached via Secure Chat

## 2023-07-29 ENCOUNTER — Other Ambulatory Visit (HOSPITAL_COMMUNITY): Payer: Self-pay

## 2023-07-29 ENCOUNTER — Other Ambulatory Visit (HOSPITAL_BASED_OUTPATIENT_CLINIC_OR_DEPARTMENT_OTHER): Payer: Self-pay

## 2023-07-29 DIAGNOSIS — I4891 Unspecified atrial fibrillation: Secondary | ICD-10-CM | POA: Diagnosis not present

## 2023-07-29 LAB — GLUCOSE, CAPILLARY: Glucose-Capillary: 172 mg/dL — ABNORMAL HIGH (ref 70–99)

## 2023-07-29 MED ORDER — FUROSEMIDE 20 MG PO TABS
20.0000 mg | ORAL_TABLET | Freq: Every day | ORAL | 0 refills | Status: AC | PRN
  Filled 2023-07-29: qty 30, 30d supply, fill #0

## 2023-07-29 MED ORDER — BASAGLAR KWIKPEN 100 UNIT/ML ~~LOC~~ SOPN: 20.0000 [IU] | PEN_INJECTOR | Freq: Every day | SUBCUTANEOUS | Status: AC

## 2023-07-29 MED ORDER — APIXABAN 5 MG PO TABS
5.0000 mg | ORAL_TABLET | Freq: Two times a day (BID) | ORAL | 1 refills | Status: DC
  Filled 2023-07-29: qty 60, 30d supply, fill #0

## 2023-07-29 MED ORDER — AMIODARONE HCL 200 MG PO TABS
ORAL_TABLET | ORAL | 0 refills | Status: DC
  Filled 2023-07-29: qty 100, 44d supply, fill #0

## 2023-07-29 NOTE — TOC Transition Note (Signed)
 Transition of Care [facility]) - Discharge Note   Patient Details  Name: Sarah Miller MRN: [id number] Date of Birth: 11/29/1945  [facility] CM/SW Contact:  Ruben Corolla, RN Phone Number: 07/29/2023, 9:38 AM   Clinical Narrative: Spoke to dtr Aron Lard contact person for [facility] to call [phone] rep angela aware. No home 02 needed or ordered. No further CM needs.      Final next level of care: Home w Home Health Services Barriers to Discharge: No Barriers Identified   Patient Goals and CMS Choice Patient states their goals for this hospitalization and ongoing recovery are:: Home CMS Medicare.gov Compare Post Acute Care list provided to:: Patient Represenative (must comment) (Camille(dtr)) Choice offered to / list presented to : Adult Children [place] ownership interest in [facility].provided to:: Adult Children    Discharge Placement                       Discharge Plan and Services Additional resources added to the After Visit Summary for   In-house Referral: NA Discharge Planning Services: CM Consult            DME Arranged: N/A DME Agency: NA       HH Arranged: PT HH Agency: [facility] Date [facility] Agency Contacted: 07/29/23 Time HH Agency Contacted: [phone] Representative spoke with at [facility] Agency: [place] at [place]  Social Drivers of Health (SDOH) Interventions SDOH Screenings   Food Insecurity: No Food Insecurity (07/25/2023)  Housing: Low Risk  (07/25/2023)  Transportation Needs: No Transportation Needs (07/25/2023)  Utilities: Not At Risk (07/25/2023)  Social Connections: Socially Isolated (07/25/2023)  Tobacco Use: Medium Risk (07/27/2023)     Readmission Risk Interventions     No data to display

## 2023-07-29 NOTE — Progress Notes (Signed)
 AVS reviewed w/Aliciana  & daughter. Amiodarone  taper reviewed, dates for taper schedule written on AVS med section. PIV removed as noted. [facility] tele notified via phone by this RN of pt d/c- tel removed. Pt dressing for d/c to home. Home w/ daughter.

## 2023-07-29 NOTE — Discharge Summary (Signed)
 Physician Discharge Summary  Sarah Miller Miller:[id number] DOB: [birth year] DOA: 07/25/2023  PCP: Victorio Grave, MD  Admit date: 07/25/2023 Discharge date: 07/29/2023  Admitted From:  Discharge disposition: Home   Recommendations for Outpatient Follow-Up:   Cardiology follow-up-on Eliquis  for now, amiodarone  taper Home health Titrate up insulin  if needed As needed Lasix   Discharge Diagnosis:   Principal Problem:   Rapid atrial fibrillation [facility]) Active Problems:   Morbid obesity due to excess calories (HCC)   COPD (chronic obstructive pulmonary disease) (HCC)   DM (diabetes mellitus) (HCC)   Atrial fibrillation with RVR (HCC)    Discharge Condition: Improved.  Diet recommendation:Carbohydrate-modified.  Wound care: None.  Code status: Full.   History of Present Illness:   Sarah Miller is a 78 y.o. female with medical history significant for type 2 diabetes mellitus, obstructive sleep apnea, COPD, hypertension, and microcytic anemia was sent to the ED from clinic because she was found to be in A-fib with RVR. The patient had an episode 2 days ago where she became quite short of breath.  Since that time she has had persistent shortness of breath, extreme fatigue, and dizziness.  She has also felt some strong fluttery heartbeats in her neck.  She denies any chest pain or fevers.  She went to her outpatient doctors office today because of the symptoms and was sent to the ED when they found that her heart was racing.  The patient has no history of heart trouble.   In the emergency department she was found to be in atrial fibrillation with RVR.  She required diltiazem  drip to control her heart rate.  She was [facility] because of this.   [facility] Course by Problem:   New onset atrial fibrillation with rapid ventricular response  - Placed on amiodarone  for low blood pressure - Eliquis  - Echocardiogram- EF 65 % - Appreciate  cardiology consultation   Hypoxia -lasix PO x 1 -Improvement in oxygen status, no need for home O2   Leukocytosis - No sign of infection   DMT2-  -Resume home meds    H/o anemia - Hgb stable at 12.   -Trend     COPD -no wheezing at present.  Her shortness of breath was likely because of the atrial fibrillation   OSA -outpatient follow-up   Constipation - Bowel regimen with suppository   Obesity Estimated body mass index is 43.2 kg/m as calculated from the following:   Height as of this encounter: 5\' 1"  (1.549 m).   Weight as of this encounter: 103.7 kg.     Medical Consultants:   Cardiology   Discharge Exam:   Vitals:   07/29/23 0822 07/29/23 0825  BP:    Pulse:    Resp:    Temp:    SpO2: 92% 90%   Vitals:   07/29/23 0402 07/29/23 0820 07/29/23 0822 07/29/23 0825  BP: 108/61     Pulse: 76     Resp: 19     Temp: 98 F (36.7 C)     TempSrc: Oral     SpO2: 94% (!) 87% 92% 90%  Weight:      Height:        General exam: Appears calm and comfortable.   The results of significant diagnostics from this hospitalization (including imaging, microbiology, ancillary and laboratory) are listed below for reference.     Procedures and Diagnostic Studies:   ECHOCARDIOGRAM COMPLETE Result Date: 07/26/2023  ECHOCARDIOGRAM REPORT   Patient Name:   Sarah Miller Date of Exam: 07/26/2023 Medical Rec #:  [id number]       Height:       61.0 in Accession #:    [id number]      Weight:       228.6 lb Date of Birth:  22-Jun-1945       BSA:          1.999 m Patient Age:    77 years        BP:           108/55 mmHg Patient Gender: F               HR:           79 bpm. Exam Location:  Inpatient Procedure: 2D Echo, Cardiac Doppler and Color Doppler (Both Spectral and Color            Flow Doppler were utilized during procedure). Indications:    I48.91* Unspeicified atrial fibrillation  History:        Patient has prior history of Echocardiogram examinations, most                  recent 04/17/2013.  Sonographer:    Andrena Bang Referring Phys: [phone] CLAUDIA CLAIBORNE IMPRESSIONS  1. Left ventricular ejection fraction, by estimation, is 60 to 65%. The left ventricle has normal function. The left ventricle has no regional wall motion abnormalities. Left ventricular diastolic parameters are consistent with Grade I diastolic dysfunction (impaired relaxation).  2. Right ventricular systolic function is mildly reduced. The right ventricular size is normal. There is moderately elevated pulmonary artery systolic pressure. The estimated right ventricular systolic pressure is 55.3 mmHg.  3. The mitral valve is normal in structure. No evidence of mitral valve regurgitation. No evidence of mitral stenosis.  4. Tricuspid valve regurgitation is moderate.  5. The aortic valve is normal in structure. Aortic valve regurgitation is not visualized. No aortic stenosis is present.  6. The inferior vena cava is dilated in size with >50% respiratory variability, suggesting right atrial pressure of 8 mmHg. FINDINGS  Left Ventricle: Left ventricular ejection fraction, by estimation, is 60 to 65%. The left ventricle has normal function. The left ventricle has no regional wall motion abnormalities. Definity  contrast agent was given IV to delineate the left ventricular  endocardial borders. The left ventricular internal cavity size was normal in size. There is no left ventricular hypertrophy. Left ventricular diastolic parameters are consistent with Grade I diastolic dysfunction (impaired relaxation). Right Ventricle: The right ventricular size is normal. No increase in right ventricular wall thickness. Right ventricular systolic function is mildly reduced. There is moderately elevated pulmonary artery systolic pressure. The tricuspid regurgitant velocity is 3.44 m/s, and with an assumed right atrial pressure of 8 mmHg, the estimated right ventricular systolic pressure is 55.3 mmHg. Left Atrium: Left atrial size was normal  in size. Right Atrium: Right atrial size was normal in size. Pericardium: There is no evidence of pericardial effusion. Mitral Valve: The mitral valve is normal in structure. No evidence of mitral valve regurgitation. No evidence of mitral valve stenosis. Tricuspid Valve: The tricuspid valve is normal in structure. Tricuspid valve regurgitation is moderate . No evidence of tricuspid stenosis. Aortic Valve: The aortic valve is normal in structure. Aortic valve regurgitation is not visualized. No aortic stenosis is present. Aortic valve mean gradient measures 7.0 mmHg. Aortic valve peak gradient measures 13.4 mmHg. Pulmonic Valve: The pulmonic valve  was normal in structure. Pulmonic valve regurgitation is not visualized. No evidence of pulmonic stenosis. Aorta: The aortic root is normal in size and structure. Venous: The inferior vena cava is dilated in size with greater than 50% respiratory variability, suggesting right atrial pressure of 8 mmHg. IAS/Shunts: No atrial level shunt detected by color flow Doppler.  LEFT VENTRICLE PLAX 2D LVIDd:         4.40 cm      Diastology LVIDs:         3.30 cm      LV e' medial:    6.96 cm/s LV PW:         1.20 cm      LV E/e' medial:  13.3 LV IVS:        0.80 cm      LV e' lateral:   5.44 cm/s LVOT diam:     1.80 cm      LV E/e' lateral: 17.0 LVOT Area:     2.54 cm  LV Volumes (MOD) LV vol d, MOD A2C: 127.0 ml LV vol d, MOD A4C: 109.0 ml LV vol s, MOD A2C: 42.9 ml LV vol s, MOD A4C: 35.6 ml LV SV MOD A2C:     84.1 ml LV SV MOD A4C:     109.0 ml LV SV MOD BP:      78.0 ml RIGHT VENTRICLE RV S prime:     10.00 cm/s TAPSE (M-mode): 1.8 cm LEFT ATRIUM             Index LA diam:        3.50 cm 1.75 cm/m LA Vol (A2C):   45.3 ml 22.66 ml/m LA Vol (A4C):   28.2 ml 14.11 ml/m LA Biplane Vol: 36.9 ml 18.46 ml/m  AORTIC VALVE AV Vmax:      183.00 cm/s AV Vmean:     124.000 cm/s AV VTI:       0.363 m AV Peak Grad: 13.4 mmHg AV Mean Grad: 7.0 mmHg  AORTA Ao Asc diam: 3.20 cm MITRAL VALVE                TRICUSPID VALVE MV Area (PHT): 3.95 cm    TR Peak grad:   47.3 mmHg MV Decel Time: 192 msec    TR Vmax:        344.00 cm/s MV E velocity: 92.50 cm/s MV A velocity: 90.40 cm/s  SHUNTS MV E/A ratio:  1.02        Systemic Diam: 1.80 cm Arta Lark Electronically signed by Arta Lark Signature Date/Time: 07/26/2023/4:27:54 PM    Final    DG Chest Port 1 View Result Date: 07/25/2023 CLINICAL DATA:  Shortness of breath EXAM: PORTABLE CHEST 1 VIEW COMPARISON:  11/14/2017 FINDINGS: Heart and mediastinal contours are within normal limits. No focal opacities or effusions. No acute bony abnormality. IMPRESSION: No active disease. Electronically Signed   By: Janeece Mechanic M.D.   On: 07/25/2023 20:09     Labs:   Basic Metabolic Panel: Recent Labs  Lab 07/25/23 1820 07/26/23 0309 07/27/23 0557 07/28/23 0506  NA 140 138 136 137  K 3.8 3.4* 3.8 4.0  CL 100 104 102 103  CO2 27 27 28 27   GLUCOSE 125* 163* 184* 191*  BUN 26* 24* 27* 21  CREATININE 1.17* 0.97 0.90 0.93  CALCIUM 9.3 8.6* 8.6* 8.8*  MG 1.9 2.1  --   --    GFR Estimated Creatinine Clearance: 57.9 mL/min (by C-G formula based  on SCr of 0.93 mg/dL). Liver Function Tests: Recent Labs  Lab 07/25/23 1820  AST 13*  ALT 5  ALKPHOS 72  BILITOT 0.4  PROT 6.7  ALBUMIN 3.9   No results for input(s): "LIPASE", "AMYLASE" in the last 168 hours. No results for input(s): "AMMONIA" in the last 168 hours. Coagulation profile No results for input(s): "INR", "PROTIME" in the last 168 hours.  CBC: Recent Labs  Lab 07/25/23 1820 07/26/23 0309 07/27/23 0557 07/28/23 0506  WBC 14.1* 13.5* 10.6* 12.7*  NEUTROABS 10.3*  --   --   --   HGB 12.0 11.7* 10.8* 11.0*  HCT 38.7 38.6 35.9* 36.3  MCV 83.4 84.3 85.9 85.2  PLT 304 274 246 242   Cardiac Enzymes: No results for input(s): "CKTOTAL", "CKMB", "CKMBINDEX", "TROPONINI" in the last 168 hours. BNP: Invalid input(s): "POCBNP" CBG: Recent Labs  Lab 07/28/23 0720  07/28/23 1120 07/28/23 1603 07/28/23 2108 07/29/23 0720  GLUCAP 181* 156* 156* 171* 172*   D-Dimer No results for input(s): "DDIMER" in the last 72 hours. Hgb A1c No results for input(s): "HGBA1C" in the last 72 hours. Lipid Profile Recent Labs    07/27/23 0557  CHOL 126  HDL 38*  LDLCALC 54  TRIG 161*  CHOLHDL 3.3   Thyroid  function studies No results for input(s): "TSH", "T4TOTAL", "T3FREE", "THYROIDAB" in the last 72 hours.  Invalid input(s): "FREET3" Anemia work up No results for input(s): "VITAMINB12", "FOLATE", "FERRITIN", "TIBC", "IRON", "RETICCTPCT" in the last 72 hours. Microbiology Recent Results (from the past 240 hours)  MRSA Next Gen by PCR, Nasal     Status: None   Collection Time: 07/25/23 10:24 PM   Specimen: Nasal Mucosa; Nasal Swab  Result Value Ref Range Status   MRSA by PCR Next Gen NOT DETECTED NOT DETECTED Final    Comment: (NOTE) The GeneXpert MRSA Assay (FDA approved for NASAL specimens only), is one component of a comprehensive MRSA colonization surveillance program. It is not intended to diagnose MRSA infection nor to guide or monitor treatment for MRSA infections. Test performance is not FDA approved in patients less than 35 years old. Performed at [facility], [address] [place], [place] [address]      Discharge Instructions:   Discharge Instructions     Diet Carb Modified   Complete by: As directed    Discharge instructions   Complete by: As directed    Would get pulse oximeter Can use apple watch to monitor a fib (not 100% accurate but still good) Lasix as needed Cardiology follow up   Increase activity slowly   Complete by: As directed       Allergies as of 07/29/2023       Reactions   Morphine And Codeine Nausea Only   Other    hydocan cough syrup causes nausea   Penicillins Rash   Tolerated Cephalosporin 12/28/2019.        Medication List     STOP taking these medications    Arnuity  Ellipta 200 MCG/ACT Aepb Generic drug: Fluticasone  Furoate   celecoxib 200 MG capsule Commonly known as: CELEBREX   loratadine 10 MG tablet Commonly known as: CLARITIN   metoprolol  tartrate 100 MG tablet Commonly known as: LOPRESSOR    nystatin powder Commonly known as: MYCOSTATIN/NYSTOP   Ozempic (0.25 or 0.5 MG/DOSE) 2 MG/3ML Sopn Generic drug: Semaglutide(0.25 or 0.5MG /DOS)   sertraline 50 MG tablet Commonly known as: ZOLOFT   valsartan-hydrochlorothiazide 160-12.5 MG tablet Commonly known as: DIOVAN-HCT  valsartan-hydrochlorothiazide 160-25 MG tablet Commonly known as: DIOVAN-HCT       TAKE these medications    albuterol  108 (90 Base) MCG/ACT inhaler Commonly known as: VENTOLIN  HFA Inhale 1-2 puffs into the lungs every 6 (six) hours as needed for wheezing or shortness of breath.   amiodarone  200 MG tablet Commonly known as: PACERONE  Amio at 400 mg bid x 2 weeks then 400 daily x 2 weeks then 200 mg   apixaban  5 MG Tabs tablet Commonly known as: ELIQUIS  Take 1 tablet (5 mg total) by mouth 2 (two) times daily.   atorvastatin 10 MG tablet Commonly known as: LIPITOR Take 10 mg by mouth daily.   Basaglar KwikPen 100 UNIT/ML Inject 20 Units into the skin at bedtime. What changed: how much to take   buPROPion  300 MG 24 hr tablet Commonly known as: WELLBUTRIN  XL Take 300 mg by mouth daily.   desvenlafaxine 25 MG 24 hr tablet Commonly known as: PRISTIQ Take 25 mg by mouth daily.   ferrous sulfate 325 (65 FE) MG EC tablet Take 325 mg by mouth 2 (two) times daily with a meal.   fluticasone  50 MCG/ACT nasal spray Commonly known as: FLONASE  SPRAY 2 SPRAYS INTO EACH NOSTRIL EVERY DAY   furosemide 20 MG tablet Commonly known as: LASIX Take 1 tablet (20 mg total) by mouth daily as needed for fluid or edema.   metFORMIN 500 MG 24 hr tablet Commonly known as: GLUCOPHAGE-XR Take 500 mg by mouth in the morning and at bedtime. 500 mg in morning and 500 mg at  night   montelukast  10 MG tablet Commonly known as: SINGULAIR  TAKE 1 TABLET BY MOUTH EVERYDAY AT BEDTIME   Mounjaro 2.5 MG/0.5ML Pen Generic drug: tirzepatide Inject 2.5 mg into the skin once a week.   ondansetron  4 MG tablet Commonly known as: ZOFRAN  Take 4 mg by mouth every 8 (eight) hours as needed for nausea or vomiting.        Follow-up Information     [facility] [place], [place] Follow up.   Contact information: [address] [place] [address] [phone]         Carie Charity, NP Follow up on 08/12/2023.   Specialty: Cardiology Why: Cardiology [facility] Follow-up on 08/12/2023 at 8:25 AM. Contact information: [address] [place] [address] [phone]         Victorio Grave, MD Follow up in 1 week(s).   Specialty: Family Medicine Contact information: [address], [address] [place] [address] [phone]                  Time coordinating discharge: 45 minutes  Signed:  Enrigue Harvard DO  [facility] 07/29/2023, 8:57 AM

## 2023-07-29 NOTE — Progress Notes (Signed)
 SATURATION QUALIFICATIONS: (This note is used to comply with regulatory documentation for home oxygen)  Patient Saturations on Room Air at Rest = 87%  Patient Saturations on Room Air while Ambulating = 92%  Did not apply oxygen while ambulating because it stayed 90-92% the majority of the time with one brief drop to 87%. Pt was not symptomatic.  Please briefly explain why patient needs home oxygen:

## 2023-07-30 DIAGNOSIS — Z7901 Long term (current) use of anticoagulants: Secondary | ICD-10-CM | POA: Diagnosis not present

## 2023-07-30 DIAGNOSIS — Z794 Long term (current) use of insulin: Secondary | ICD-10-CM | POA: Diagnosis not present

## 2023-07-30 DIAGNOSIS — Z7984 Long term (current) use of oral hypoglycemic drugs: Secondary | ICD-10-CM | POA: Diagnosis not present

## 2023-07-30 DIAGNOSIS — I10 Essential (primary) hypertension: Secondary | ICD-10-CM | POA: Diagnosis not present

## 2023-07-30 DIAGNOSIS — Z7985 Long-term (current) use of injectable non-insulin antidiabetic drugs: Secondary | ICD-10-CM | POA: Diagnosis not present

## 2023-07-30 DIAGNOSIS — E119 Type 2 diabetes mellitus without complications: Secondary | ICD-10-CM | POA: Diagnosis not present

## 2023-07-30 DIAGNOSIS — I4891 Unspecified atrial fibrillation: Secondary | ICD-10-CM | POA: Diagnosis not present

## 2023-07-30 DIAGNOSIS — I4510 Unspecified right bundle-branch block: Secondary | ICD-10-CM | POA: Diagnosis not present

## 2023-07-30 DIAGNOSIS — D649 Anemia, unspecified: Secondary | ICD-10-CM | POA: Diagnosis not present

## 2023-07-30 DIAGNOSIS — J449 Chronic obstructive pulmonary disease, unspecified: Secondary | ICD-10-CM | POA: Diagnosis not present

## 2023-08-02 DIAGNOSIS — J449 Chronic obstructive pulmonary disease, unspecified: Secondary | ICD-10-CM | POA: Diagnosis not present

## 2023-08-02 DIAGNOSIS — D649 Anemia, unspecified: Secondary | ICD-10-CM | POA: Diagnosis not present

## 2023-08-02 DIAGNOSIS — I4510 Unspecified right bundle-branch block: Secondary | ICD-10-CM | POA: Diagnosis not present

## 2023-08-02 DIAGNOSIS — E119 Type 2 diabetes mellitus without complications: Secondary | ICD-10-CM | POA: Diagnosis not present

## 2023-08-02 DIAGNOSIS — I4891 Unspecified atrial fibrillation: Secondary | ICD-10-CM | POA: Diagnosis not present

## 2023-08-02 DIAGNOSIS — I10 Essential (primary) hypertension: Secondary | ICD-10-CM | POA: Diagnosis not present

## 2023-08-06 ENCOUNTER — Other Ambulatory Visit: Payer: Self-pay

## 2023-08-06 DIAGNOSIS — J449 Chronic obstructive pulmonary disease, unspecified: Secondary | ICD-10-CM | POA: Diagnosis not present

## 2023-08-06 DIAGNOSIS — Z87891 Personal history of nicotine dependence: Secondary | ICD-10-CM

## 2023-08-06 DIAGNOSIS — I4891 Unspecified atrial fibrillation: Secondary | ICD-10-CM | POA: Diagnosis not present

## 2023-08-06 DIAGNOSIS — I4510 Unspecified right bundle-branch block: Secondary | ICD-10-CM | POA: Diagnosis not present

## 2023-08-06 DIAGNOSIS — D649 Anemia, unspecified: Secondary | ICD-10-CM | POA: Diagnosis not present

## 2023-08-06 DIAGNOSIS — Z122 Encounter for screening for malignant neoplasm of respiratory organs: Secondary | ICD-10-CM

## 2023-08-06 DIAGNOSIS — E119 Type 2 diabetes mellitus without complications: Secondary | ICD-10-CM | POA: Diagnosis not present

## 2023-08-06 DIAGNOSIS — I10 Essential (primary) hypertension: Secondary | ICD-10-CM | POA: Diagnosis not present

## 2023-08-08 DIAGNOSIS — D649 Anemia, unspecified: Secondary | ICD-10-CM | POA: Diagnosis not present

## 2023-08-08 DIAGNOSIS — I10 Essential (primary) hypertension: Secondary | ICD-10-CM | POA: Diagnosis not present

## 2023-08-08 DIAGNOSIS — I4510 Unspecified right bundle-branch block: Secondary | ICD-10-CM | POA: Diagnosis not present

## 2023-08-08 DIAGNOSIS — J449 Chronic obstructive pulmonary disease, unspecified: Secondary | ICD-10-CM | POA: Diagnosis not present

## 2023-08-08 DIAGNOSIS — E119 Type 2 diabetes mellitus without complications: Secondary | ICD-10-CM | POA: Diagnosis not present

## 2023-08-08 DIAGNOSIS — I4891 Unspecified atrial fibrillation: Secondary | ICD-10-CM | POA: Diagnosis not present

## 2023-08-08 NOTE — Progress Notes (Signed)
 Cardiology Clinic Note   Patient Name: ILIYANA Miller Date of Encounter: 08/12/2023  Primary Care Provider:  Victorio Grave, MD Primary Cardiologist:  Gaylyn Keas, MD  Patient Profile    Sarah Miller 78 year old female presents to the clinic today for follow-up of her atrial fibrillation.  Past Medical History    Past Medical History:  Diagnosis Date   Allergic rhinitis    Anxiety    Aortic atherosclerosis (HCC)    Cancer (HCC)    h/o melanoma '78 and 79   Cervical arthritis    Dr Jolinda Necessary, S/P injection with good results.    COPD (chronic obstructive pulmonary disease) (HCC)    with emphysema   Coronary artery calcification seen on CAT scan    Depression    and anxiety   Diabetes mellitus without complication (HCC)    Early menopause    Frozen shoulder    H/O frozen shoulder and rotator cuff tendonititis, L s/p injection   GERD (gastroesophageal reflux disease)    PONV (postoperative nausea and vomiting)    Sleep apnea    Vitamin D deficiency    Past Surgical History:  Procedure Laterality Date   ABDOMINAL HYSTERECTOMY     CATARACT EXTRACTION W/ INTRAOCULAR LENS IMPLANT Bilateral    FRACTURE SURGERY Left    wrist   MELANOMA EXCISION     REVERSE SHOULDER ARTHROPLASTY Left 12/28/2019   Procedure: REVERSE SHOULDER ARTHROPLASTY;  Surgeon: Micheline Ahr, MD;  Location: [facility];  Service: Orthopedics;  Laterality: Left;    Allergies  Allergies  Allergen Reactions   Morphine And Codeine Nausea Only   Other     hydocan cough syrup causes nausea   Penicillins Rash    Tolerated Cephalosporin 12/28/2019.     History of Present Illness    Sarah Miller has a PMH of OSA, COPD, hypertension, microcytic anemia, type 2 diabetes, and atrial fibrillation with RVR.  She presented to the emergency department on 07/25/2023 and was discharged on 07/29/2023.  She reported an episode 2 days prior where she became short of breath.  Since her episode she noted  persistent shortness of breath, extreme fatigue, and dizziness.  She also noted fluttering type heartbeats in her neck.  She denied chest pain and fevers.  She had presented to her primary care physician's office and was sent to the emergency department.  She denied history of heart issues.  She was noted to be in atrial fibrillation with RVR.  She was initiated on diltiazem  gtt. for rate control.  She was admitted to [facility] emergency department.  She also was placed on amiodarone  and apixaban .  Her echocardiogram showed an LVEF of 65%.  She presents to the clinic today for follow-up evaluation and states she is feeling somewhat better today.  She continues to have shortness of breath.  This appears to be chronic.  She presents with her daughter.  We reviewed her recent emergency department visit and recommendations.  She will not be a good candidate for apixaban  due to her history of falls and instability.  She routinely uses a walker.  She is cardiac unaware.  Her EKG today shows sinus rhythm 71 bpm.  We discussed possibility of Watchman implant.  I will give her the lower extremity support stocking sheet, instructions for avoiding triggers for A-fib, place referral to EP, and plan follow-up in 3 to 4 months.  Today she denies chest pain, increased shortness of breath, lower extremity  edema, fatigue, palpitations, melena, hematuria, hemoptysis, diaphoresis, weakness, presyncope, syncope, orthopnea, and PND.    Home Medications    Prior to Admission medications   Medication Sig Start Date End Date Taking? Authorizing Provider  albuterol  (VENTOLIN  HFA) 108 (90 Base) MCG/ACT inhaler Inhale 1-2 puffs into the lungs every 6 (six) hours as needed for wheezing or shortness of breath. 07/23/23   [provider]  amiodarone  (PACERONE ) 200 MG tablet Take 2 tablets (400 mg total) 2 (two) times daily for 14 days, THEN 2 tablets (400 mg total) daily for 14 days, THEN 1 tablet (200 mg  total) daily for 16 days. 07/29/23 09/11/23  Enrigue Harvard, DO  apixaban  (ELIQUIS ) 5 MG TABS tablet Take 1 tablet (5 mg total) by mouth 2 (two) times daily. 07/29/23   Vann, Jessica U, DO  atorvastatin (LIPITOR) 10 MG tablet Take 10 mg by mouth daily. 09/02/17   [provider]  buPROPion  (WELLBUTRIN  XL) 300 MG 24 hr tablet Take 300 mg by mouth daily.     [provider]  desvenlafaxine  (PRISTIQ ) 25 MG 24 hr tablet Take 25 mg by mouth daily. 07/08/23   [provider]  ferrous sulfate 325 (65 FE) MG EC tablet Take 325 mg by mouth 2 (two) times daily with a meal.    [provider]  fluticasone  (FLONASE ) 50 MCG/ACT nasal spray SPRAY 2 SPRAYS INTO EACH NOSTRIL EVERY DAY 03/17/20   Myer Artis A, MD  furosemide  (LASIX ) 20 MG tablet Take 1 tablet (20 mg total) by mouth daily as needed for fluid or edema. 07/29/23   Vann, Jessica U, DO  Insulin  Glargine (BASAGLAR  KWIKPEN) 100 UNIT/ML Inject 20 Units into the skin at bedtime. 07/29/23   Vann, Jessica U, DO  metFORMIN (GLUCOPHAGE-XR) 500 MG 24 hr tablet Take 500 mg by mouth in the morning and at bedtime. 500 mg in morning and 500 mg at night 10/16/17   [provider]  montelukast  (SINGULAIR ) 10 MG tablet TAKE 1 TABLET BY MOUTH EVERYDAY AT BEDTIME 02/08/20   Wert, Michael B, MD  MOUNJARO 2.5 MG/0.5ML Pen Inject 2.5 mg into the skin once a week. 07/09/23   [provider]  ondansetron  (ZOFRAN ) 4 MG tablet Take 4 mg by mouth every 8 (eight) hours as needed for nausea or vomiting. 07/19/23   [provider]    Family History    Family History  Problem Relation Age of Onset   Cancer Mother        ovarian cancer   Hypertension Father    Diabetes Father    Heart attack Father    Cancer Father        testicular   CAD Father    Heart disease Father    Hyperlipidemia Father    Heart attack Brother 42   CAD Brother    Heart disease Brother    CAD Paternal Grandfather    Hypertension Sister     Cancer Other    Hyperlipidemia Other    Heart disease Other    She indicated that her mother is deceased. She indicated that her father is deceased. She indicated that both of her sisters are alive. She indicated that her brother is deceased. She indicated that her maternal grandmother is deceased. She indicated that her maternal grandfather is deceased. She indicated that her paternal grandmother is deceased. She indicated that her paternal grandfather is deceased. She indicated that the status of her other is unknown.  Social History  Social History   Socioeconomic History   Marital status: Divorced    Spouse name: Not on file   Number of children: Not on file   Years of education: Not on file   Highest education level: Not on file  Occupational History   Not on file  Tobacco Use   Smoking status: Former    Current packs/day: 0.00    Average packs/day: 0.8 packs/day for 53.0 years (39.8 ttl pk-yrs)    Types: Cigarettes    Start date: 02/27/1963    Quit date: 02/27/2016    Years since quitting: 7.4   Smokeless tobacco: Never  Vaping Use   Vaping status: Never Used  Substance and Sexual Activity   Alcohol use: Yes    Comment: rare   Drug use: No   Sexual activity: Not on file    Comment: Hysterectomy  Other Topics Concern   Not on file  Social History Narrative   Not on file   Social Drivers of Health   Financial Resource Strain: Not on file  Food Insecurity: No Food Insecurity (07/25/2023)   Hunger Vital Sign    Worried About Running Out of Food in the Last Year: Never true    Ran Out of Food in the Last Year: Never true  Transportation Needs: No Transportation Needs (07/25/2023)   PRAPARE - Administrator, Civil Service (Medical): No    Lack of Transportation (Non-Medical): No  Physical Activity: Not on file  Stress: Not on file  Social Connections: Socially Isolated (07/25/2023)   Social Connection and Isolation Panel    Frequency of Communication with  Friends and Family: More than three times a week    Frequency of Social Gatherings with Friends and Family: Three times a week    Attends Religious Services: Never    Active Member of Clubs or Organizations: No    Attends Banker Meetings: Never    Marital Status: Divorced  Catering manager Violence: Not At Risk (07/25/2023)   Humiliation, Afraid, Rape, and Kick questionnaire    Fear of Current or Ex-Partner: No    Emotionally Abused: No    Physically Abused: No    Sexually Abused: No     Review of Systems    General:  No chills, fever, night sweats or weight changes.  Cardiovascular:  No chest pain, dyspnea on exertion, edema, orthopnea, palpitations, paroxysmal nocturnal dyspnea. Dermatological: No rash, lesions/masses Respiratory: No cough, dyspnea Urologic: No hematuria, dysuria Abdominal:   No nausea, vomiting, diarrhea, bright red blood per rectum, melena, or hematemesis Neurologic:  No visual changes, wkns, changes in mental status. All other systems reviewed and are otherwise negative except as noted above.  Physical Exam    VS:  BP 110/80   Pulse 71   Ht 5' 1 (1.549 m)   Wt 228 lb 6.4 oz (103.6 kg)   SpO2 94%   BMI 43.16 kg/m  , BMI Body mass index is 43.16 kg/m. GEN: Well nourished, well developed, in no acute distress. HEENT: normal. Neck: Supple, no JVD, carotid bruits, or masses. Cardiac: RRR, no murmurs, rubs, or gallops. No clubbing, cyanosis, edema.  Radials/DP/PT 2+ and equal bilaterally.  Respiratory:  Respirations regular and unlabored, clear to auscultation bilaterally. GI: Soft, nontender, nondistended, BS + x 4. MS: no deformity or atrophy. Skin: warm and dry, no rash. Neuro:  Strength and sensation are intact. Psych: Normal affect.  Accessory Clinical Findings    Recent Labs: 07/25/2023: ALT 5; Pro  Brain Natriuretic Peptide 2,279.0 07/26/2023: Magnesium 2.1; TSH 1.307 07/28/2023: BUN 21; Creatinine, Ser 0.93; Hemoglobin 11.0;  Platelets 242; Potassium 4.0; Sodium 137   Recent Lipid Panel    Component Value Date/Time   CHOL 126 07/27/2023 0557   TRIG 168 (H) 07/27/2023 0557   HDL 38 (L) 07/27/2023 0557   CHOLHDL 3.3 07/27/2023 0557   VLDL 34 07/27/2023 0557   LDLCALC 54 07/27/2023 0557         ECG personally reviewed by me today- EKG Interpretation Date/Time:  Monday August 12 2023 08:38:25 EDT Ventricular Rate:  71 PR Interval:  178 QRS Duration:  124 QT Interval:  440 QTC Calculation: 478 R Axis:   -54  Text Interpretation: Normal sinus rhythm Right bundle branch block Left anterior fascicular block Bifascicular block When compared with ECG of 25-Jul-2023 17:59, PREVIOUS ECG IS PRESENT Confirmed by Lawana Pray [phone]) on 08/12/2023 8:46:52 AM    Echocardiogram 07/26/2023  IMPRESSIONS     1. Left ventricular ejection fraction, by estimation, is 60 to 65%. The  left ventricle has normal function. The left ventricle has no regional  wall motion abnormalities. Left ventricular diastolic parameters are  consistent with Grade I diastolic  dysfunction (impaired relaxation).   2. Right ventricular systolic function is mildly reduced. The right  ventricular size is normal. There is moderately elevated pulmonary artery  systolic pressure. The estimated right ventricular systolic pressure is  55.3 mmHg.   3. The mitral valve is normal in structure. No evidence of mitral valve  regurgitation. No evidence of mitral stenosis.   4. Tricuspid valve regurgitation is moderate.   5. The aortic valve is normal in structure. Aortic valve regurgitation is  not visualized. No aortic stenosis is present.   6. The inferior vena cava is dilated in size with >50% respiratory  variability, suggesting right atrial pressure of 8 mmHg.   FINDINGS   Left Ventricle: Left ventricular ejection fraction, by estimation, is 60  to 65%. The left ventricle has normal function. The left ventricle has no  regional wall motion  abnormalities. Definity  contrast agent was given IV  to delineate the left ventricular   endocardial borders. The left ventricular internal cavity size was normal  in size. There is no left ventricular hypertrophy. Left ventricular  diastolic parameters are consistent with Grade I diastolic dysfunction  (impaired relaxation).   Right Ventricle: The right ventricular size is normal. No increase in  right ventricular wall thickness. Right ventricular systolic function is  mildly reduced. There is moderately elevated pulmonary artery systolic  pressure. The tricuspid regurgitant  velocity is 3.44 m/s, and with an assumed right atrial pressure of 8 mmHg,  the estimated right ventricular systolic pressure is 55.3 mmHg.   Left Atrium: Left atrial size was normal in size.   Right Atrium: Right atrial size was normal in size.   Pericardium: There is no evidence of pericardial effusion.   Mitral Valve: The mitral valve is normal in structure. No evidence of  mitral valve regurgitation. No evidence of mitral valve stenosis.   Tricuspid Valve: The tricuspid valve is normal in structure. Tricuspid  valve regurgitation is moderate . No evidence of tricuspid stenosis.   Aortic Valve: The aortic valve is normal in structure. Aortic valve  regurgitation is not visualized. No aortic stenosis is present. Aortic  valve mean gradient measures 7.0 mmHg. Aortic valve peak gradient measures  13.4 mmHg.   Pulmonic Valve: The pulmonic valve was normal in structure. Pulmonic valve  regurgitation is not visualized. No evidence of pulmonic stenosis.   Aorta: The aortic root is normal in size and structure.   Venous: The inferior vena cava is dilated in size with greater than 50%  respiratory variability, suggesting right atrial pressure of 8 mmHg.   IAS/Shunts: No atrial level shunt detected by color flow Doppler.      Assessment & Plan   1.  Atrial fibrillation-EKG today shows sinus rhythm.  She  reports compliance with apixaban .  Denies bleeding issues.  Denies recent trauma.  Denies recent episodes of accelerated or irregular heartbeat.  Main symptom was increased shortness of breath and activity intolerance. Heart healthy low-sodium diet Avoid triggers caffeine, chocolate, EtOH, dehydration etc.-reviewed Continue amiodarone  as directed  Continue apixaban  Ep for watchman  OSA-reports compliance with CPAP.   Is in the process of getting a new CPAP device. Avoid supine sleeping Sleep hygiene instructions  Obesity-weight today 228 pounds. Reduced calorie diet Increase physical activity as tolerated Continue weight loss  COPD-breathing has returned to baseline. Increase physical activity as tolerated Continue current medical therapy  Type 2 diabetes-glucose 191 on 07/28/2023. Continue metformin Carb modified diet Follows with PCP  Disposition: Follow-up with Dr. Micael Adas or me in 3-4 months.   Chet Cota. Pravin Perezperez NP-C     08/12/2023, 8:48 AM [facility] [address] Office [phone] Fax [phone]    I spent 15 minutes examining this patient, reviewing medications, and using patient centered shared decision making involving their cardiac care.   I spent  20 minutes reviewing past medical history,  medications, and prior cardiac tests.

## 2023-08-12 ENCOUNTER — Encounter: Payer: Self-pay | Admitting: General Practice

## 2023-08-12 ENCOUNTER — Ambulatory Visit: Attending: General Practice | Admitting: General Practice

## 2023-08-12 VITALS — BP 110/80 | HR 71 | Ht 61.0 in | Wt 228.4 lb

## 2023-08-12 DIAGNOSIS — I2510 Atherosclerotic heart disease of native coronary artery without angina pectoris: Secondary | ICD-10-CM

## 2023-08-12 DIAGNOSIS — G4733 Obstructive sleep apnea (adult) (pediatric): Secondary | ICD-10-CM | POA: Diagnosis not present

## 2023-08-12 DIAGNOSIS — J438 Other emphysema: Secondary | ICD-10-CM

## 2023-08-12 DIAGNOSIS — I4891 Unspecified atrial fibrillation: Secondary | ICD-10-CM | POA: Diagnosis not present

## 2023-08-12 NOTE — Patient Instructions (Addendum)
 Medication Instructions:  Your physician recommends that you continue on your current medications as directed. Please refer to the Current Medication list given to you today. *If you need a refill on your cardiac medications before your next appointment, please call your pharmacy*  Lab Work: None ordered If you have labs (blood work) drawn today and your tests are completely normal, you will receive your results only by: MyChart Message (if you have MyChart) OR A paper copy in the mail If you have any lab test that is abnormal or we need to change your treatment, we will call you to review the results.  Testing/Procedures:  None ordered  Follow-Up: At [facility], you and your health needs are our priority.  As part of our continuing mission to provide you with exceptional heart care, our providers are all part of one team.  This team includes your primary Cardiologist (physician) and Advanced Practice Providers or APPs (Physician Assistants and Nurse Practitioners) who all work together to provide you with the care you need, when you need it.  Your next appointment:   3 month(s)  Provider:   Gaylyn Keas, MD or Lawana Pray, NP  You have been referred to Electrophysiologist to discuss watchman.     We recommend signing up for the patient portal called MyChart.  Sign up information is provided on this After Visit Summary.  MyChart is used to connect with patients for Virtual Visits (Telemedicine).  Patients are able to view lab/test results, encounter notes, upcoming appointments, etc.  Non-urgent messages can be sent to your provider as well.   To learn more about what you can do with MyChart, go to [email or web address].   Other Instructions: WEAR YOUR CPAP EVERY NIGHT  STAY HYDRATED  Please try to avoid these triggers: Do not use any products that have nicotine or tobacco in them. These include cigarettes, e-cigarettes, and chewing tobacco. If you need help  quitting, ask your doctor. Eat heart-healthy foods. Talk with your doctor about the right eating plan for you. Exercise regularly as told by your doctor. Stay hydrated Do not drink alcohol, Caffeine or chocolate. Lose weight if you are overweight. Do not use drugs, including cannabis

## 2023-08-13 DIAGNOSIS — D649 Anemia, unspecified: Secondary | ICD-10-CM | POA: Diagnosis not present

## 2023-08-13 DIAGNOSIS — I10 Essential (primary) hypertension: Secondary | ICD-10-CM | POA: Diagnosis not present

## 2023-08-13 DIAGNOSIS — I4891 Unspecified atrial fibrillation: Secondary | ICD-10-CM | POA: Diagnosis not present

## 2023-08-13 DIAGNOSIS — J449 Chronic obstructive pulmonary disease, unspecified: Secondary | ICD-10-CM | POA: Diagnosis not present

## 2023-08-13 DIAGNOSIS — E119 Type 2 diabetes mellitus without complications: Secondary | ICD-10-CM | POA: Diagnosis not present

## 2023-08-13 DIAGNOSIS — I4510 Unspecified right bundle-branch block: Secondary | ICD-10-CM | POA: Diagnosis not present

## 2023-08-14 ENCOUNTER — Emergency Department (HOSPITAL_COMMUNITY)
Admission: EM | Admit: 2023-08-14 | Discharge: 2023-08-15 | Disposition: A | Discharge status: Home / Self Care | Attending: Emergency Medicine | Admitting: Emergency Medicine

## 2023-08-14 ENCOUNTER — Emergency Department (HOSPITAL_COMMUNITY)

## 2023-08-14 DIAGNOSIS — Z794 Long term (current) use of insulin: Secondary | ICD-10-CM | POA: Insufficient documentation

## 2023-08-14 DIAGNOSIS — J449 Chronic obstructive pulmonary disease, unspecified: Secondary | ICD-10-CM | POA: Insufficient documentation

## 2023-08-14 DIAGNOSIS — W1809XA Striking against other object with subsequent fall, initial encounter: Secondary | ICD-10-CM | POA: Insufficient documentation

## 2023-08-14 DIAGNOSIS — S0003XA Contusion of scalp, initial encounter: Secondary | ICD-10-CM

## 2023-08-14 DIAGNOSIS — R110 Nausea: Secondary | ICD-10-CM | POA: Diagnosis not present

## 2023-08-14 DIAGNOSIS — W19XXXA Unspecified fall, initial encounter: Secondary | ICD-10-CM | POA: Diagnosis not present

## 2023-08-14 DIAGNOSIS — S0990XA Unspecified injury of head, initial encounter: Secondary | ICD-10-CM | POA: Diagnosis not present

## 2023-08-14 DIAGNOSIS — E119 Type 2 diabetes mellitus without complications: Secondary | ICD-10-CM | POA: Insufficient documentation

## 2023-08-14 DIAGNOSIS — I10 Essential (primary) hypertension: Secondary | ICD-10-CM | POA: Diagnosis not present

## 2023-08-14 DIAGNOSIS — S161XXA Strain of muscle, fascia and tendon at neck level, initial encounter: Secondary | ICD-10-CM | POA: Insufficient documentation

## 2023-08-14 DIAGNOSIS — I959 Hypotension, unspecified: Secondary | ICD-10-CM | POA: Diagnosis not present

## 2023-08-14 DIAGNOSIS — Z7901 Long term (current) use of anticoagulants: Secondary | ICD-10-CM | POA: Insufficient documentation

## 2023-08-14 DIAGNOSIS — S199XXA Unspecified injury of neck, initial encounter: Secondary | ICD-10-CM | POA: Diagnosis not present

## 2023-08-14 DIAGNOSIS — S50311A Abrasion of right elbow, initial encounter: Secondary | ICD-10-CM | POA: Diagnosis not present

## 2023-08-14 LAB — BASIC METABOLIC PANEL WITH GFR
Anion gap: 11 (ref 5–15)
BUN: 20 mg/dL (ref 8–23)
CO2: 26 mmol/L (ref 22–32)
Calcium: 8.8 mg/dL — ABNORMAL LOW (ref 8.9–10.3)
Chloride: 104 mmol/L (ref 98–111)
Creatinine, Ser: 1.22 mg/dL — ABNORMAL HIGH (ref 0.44–1.00)
GFR, Estimated: 46 mL/min — ABNORMAL LOW (ref >60)
Glucose, Bld: 150 mg/dL — ABNORMAL HIGH (ref 70–99)
Potassium: 4.4 mmol/L (ref 3.5–5.1)
Sodium: 141 mmol/L (ref 135–145)

## 2023-08-14 LAB — CBC
HCT: 37.8 % (ref 36.0–46.0)
Hemoglobin: 11.4 g/dL — ABNORMAL LOW (ref 12.0–15.0)
MCH: 25.8 pg — ABNORMAL LOW (ref 26.0–34.0)
MCHC: 30.2 g/dL (ref 30.0–36.0)
MCV: 85.5 fL (ref 80.0–100.0)
Platelets: 357 10*3/uL (ref 150–400)
RBC: 4.42 MIL/uL (ref 3.87–5.11)
RDW: 15.9 % — ABNORMAL HIGH (ref 11.5–15.5)
WBC: 14.2 10*3/uL — ABNORMAL HIGH (ref 4.0–10.5)
nRBC: 0 % (ref 0.0–0.2)

## 2023-08-14 LAB — I-STAT CHEM 8, ED
BUN: 24 mg/dL — ABNORMAL HIGH (ref 8–23)
Calcium, Ion: 1.1 mmol/L — ABNORMAL LOW (ref 1.15–1.40)
Chloride: 103 mmol/L (ref 98–111)
Creatinine, Ser: 1.2 mg/dL — ABNORMAL HIGH (ref 0.44–1.00)
Glucose, Bld: 145 mg/dL — ABNORMAL HIGH (ref 70–99)
HCT: 37 % (ref 36.0–46.0)
Hemoglobin: 12.6 g/dL (ref 12.0–15.0)
Potassium: 4.4 mmol/L (ref 3.5–5.1)
Sodium: 141 mmol/L (ref 135–145)
TCO2: 31 mmol/L (ref 22–32)

## 2023-08-14 MED ORDER — FENTANYL CITRATE PF 50 MCG/ML IJ SOSY
50.0000 ug | PREFILLED_SYRINGE | Freq: Once | INTRAMUSCULAR | Status: AC
  Administered 2023-08-14: 50 ug via INTRAVENOUS
  Filled 2023-08-14: qty 1

## 2023-08-14 NOTE — ED Triage Notes (Signed)
 Trauma Level 2 Fall on Blood Thinner - Eliquis    Pt arrives via [facility] from home following a mechanical fall. Sts she tripped and fell causing her to strike the back of her head. No LOC. Reports ongoing cervical pain. Hematoma to the back of the head. Started on elqiuis last week following dx of a fib. A&Ox4 on arrival. C spine held by washcloth placed by EMS

## 2023-08-14 NOTE — ED Notes (Signed)
 Trauma Response Nurse Documentation   Sarah Miller is a 78 y.o. female arriving to [facility] ED via [name]  On Eliquis  (apixaban ) daily. Trauma was activated as a Level 2 by [name] based on the following trauma criteria Elderly patients > 65 with head trauma on anti-coagulation (excluding ASA).  Patient cleared for CT by [name]. Pt transported to CT with trauma response nurse present to monitor. RN remained with the patient throughout their absence from the department for clinical observation.   GCS 15.  Trauma MD Arrival Time: N/A.  History   Past Medical History:  Diagnosis Date   Allergic rhinitis    Anxiety    Aortic atherosclerosis (HCC)    Cancer (HCC)    h/o melanoma '78 and 79   Cervical arthritis    [name], S/P injection with good results.    COPD (chronic obstructive pulmonary disease) (HCC)    with emphysema   Coronary artery calcification seen on CAT scan    Depression    and anxiety   Diabetes mellitus without complication (HCC)    Early menopause    Frozen shoulder    H/O frozen shoulder and rotator cuff tendonititis, L s/p injection   GERD (gastroesophageal reflux disease)    PONV (postoperative nausea and vomiting)    Sleep apnea    Vitamin D deficiency      Past Surgical History:  Procedure Laterality Date   ABDOMINAL HYSTERECTOMY     CATARACT EXTRACTION W/ INTRAOCULAR LENS IMPLANT Bilateral    FRACTURE SURGERY Left    wrist   MELANOMA EXCISION     REVERSE SHOULDER ARTHROPLASTY Left 12/28/2019   Procedure: REVERSE SHOULDER ARTHROPLASTY;  Surgeon: [name], MD;  Location: [facility];  Service: Orthopedics;  Laterality: Left;       Initial Focused Assessment (If applicable, or please see trauma documentation): Airway-- intact, no visible obstruction Breathing-- spontaneous, unlabored Circulation-- skin tear to right elbow, no bleeding noted  CT's Completed:   CT Head and CT C-Spine   Interventions:   See event summary  Plan for disposition:  Discharge home   Consults completed:  none at 0059.  Event Summary: Patient brought in by [facility] from home. Patient with a mechanical trip and fall this evening striking the back of her head. Patient newly started on eliquis . On arrival patient transferred from EMS stretcher to hospital stretcher. Manual BP obtained. Lab work obtained. Patient complaint of neck pain on exam. Patient to CT with TRN. CT head and c-spine completed. Patient back to room at this time.   MTP Summary (If applicable):  N/A  Bedside handoff with ED RN [name].    Bernardino Mayotte  Trauma Response RN  Please call TRN at [phone] for further assistance.

## 2023-08-14 NOTE — ED Provider Notes (Signed)
 [place] EMERGENCY DEPARTMENT AT [facility] Provider Note   CSN: [id number] Arrival date & time: 08/14/23  2210     Patient presents with: Trauma (FOT)   IDA MILBRATH is a 78 y.o. female with past medical history seen for diabetes, obesity, COPD, recent onset of A-fib who was started on Eliquis  who presents after mechanical fall with head, neck injury.  She reports feeling a goose egg back of her head.  Some nausea with EMS but no vomiting.  Scrape also noted to right elbow.  She denies any numbness, tingling.  No chest pain or presyncope prior to fall.  She reports that she is just clumsy.  Denies any double vision, confusion.   HPI     Prior to Admission medications   Medication Sig Start Date End Date Taking? Authorizing Provider  albuterol  (VENTOLIN  HFA) 108 (90 Base) MCG/ACT inhaler Inhale 1-2 puffs into the lungs every 6 (six) hours as needed for wheezing or shortness of breath. 07/23/23   [provider]  amiodarone  (PACERONE ) 200 MG tablet Take 2 tablets (400 mg total) 2 (two) times daily for 14 days, THEN 2 tablets (400 mg total) daily for 14 days, THEN 1 tablet (200 mg total) daily for 16 days. 07/29/23 09/11/23  Vann, Jessica U, DO  amiodarone  (PACERONE ) 200 MG tablet Take 200 mg by mouth 2 (two) times daily.    [provider]  apixaban  (ELIQUIS ) 5 MG TABS tablet Take 1 tablet (5 mg total) by mouth 2 (two) times daily. 07/29/23   Vann, Jessica U, DO  atorvastatin (LIPITOR) 10 MG tablet Take 10 mg by mouth daily. 09/02/17   [provider]  buPROPion  (WELLBUTRIN  XL) 300 MG 24 hr tablet Take 300 mg by mouth daily.     [provider]  desvenlafaxine  (PRISTIQ ) 25 MG 24 hr tablet Take 25 mg by mouth daily. 07/08/23   [provider]  ferrous sulfate 325 (65 FE) MG EC tablet Take 325 mg by mouth 2 (two) times daily with a meal.    [provider]  fluticasone  (FLONASE ) 50 MCG/ACT nasal spray SPRAY 2 SPRAYS INTO EACH  NOSTRIL EVERY DAY 03/17/20   Olalere, Ona Bidding A, MD  furosemide  (LASIX ) 20 MG tablet Take 1 tablet (20 mg total) by mouth daily as needed for fluid or edema. 07/29/23   Vann, Jessica U, DO  Insulin  Glargine (BASAGLAR  KWIKPEN) 100 UNIT/ML Inject 20 Units into the skin at bedtime. 07/29/23   Vann, Jessica U, DO  metFORMIN (GLUCOPHAGE-XR) 500 MG 24 hr tablet Take 500 mg by mouth in the morning and at bedtime. 500 mg in morning and 500 mg at night 10/16/17   [provider]  montelukast  (SINGULAIR ) 10 MG tablet TAKE 1 TABLET BY MOUTH EVERYDAY AT BEDTIME 02/08/20   Wert, Michael B, MD  MOUNJARO 2.5 MG/0.5ML Pen Inject 2.5 mg into the skin once a week. 07/09/23   [provider]  ondansetron  (ZOFRAN ) 4 MG tablet Take 4 mg by mouth every 8 (eight) hours as needed for nausea or vomiting. 07/19/23   [provider]  Georgianne Kirsten TEST test strip SMARTSIG:50 Daily 08/07/23   [provider]    Allergies: Morphine and codeine, Other, and Penicillins    Review of Systems  All other systems reviewed and are negative.   Updated Vital Signs BP (!) 126/50   Pulse 80   Temp 98.7 F (37.1 C) (Oral)   Resp (!) 21   SpO2 93%  Physical Exam Vitals and nursing note reviewed.  Constitutional:      General: She is not in acute distress.    Appearance: Normal appearance.  HENT:     Head: Normocephalic and atraumatic.   Eyes:     General:        Right eye: No discharge.        Left eye: No discharge.    Cardiovascular:     Rate and Rhythm: Normal rate and regular rhythm.     Pulses: Normal pulses.     Heart sounds: No murmur heard.    No friction rub. No gallop.     Comments: Intact pulses 2+ of bilateral radial, ulnar, as well as DP, PT pulses Pulmonary:     Effort: Pulmonary effort is normal.     Breath sounds: Normal breath sounds.  Abdominal:     General: Bowel sounds are normal.     Palpations: Abdomen is soft.   Musculoskeletal:     Comments: Moves all 4  limbs spontaneously, no step-off or deformity.  She is small abrasion of right elbow but normal range of motion of elbow.   Skin:    General: Skin is warm and dry.     Capillary Refill: Capillary refill takes less than 2 seconds.     Comments: Large posterior scalp hematoma not actively bleeding.   Neurological:     Mental Status: She is alert and oriented to person, place, and time.   Psychiatric:        Mood and Affect: Mood normal.        Behavior: Behavior normal.     (all labs ordered are listed, but only abnormal results are displayed) Labs Reviewed  CBC - Abnormal; Notable for the following components:      Result Value   WBC 14.2 (*)    Hemoglobin 11.4 (*)    MCH 25.8 (*)    RDW 15.9 (*)    All other components within normal limits  BASIC METABOLIC PANEL WITH GFR - Abnormal; Notable for the following components:   Glucose, Bld 150 (*)    Creatinine, Ser 1.22 (*)    Calcium 8.8 (*)    GFR, Estimated 46 (*)    All other components within normal limits  I-STAT CHEM 8, ED - Abnormal; Notable for the following components:   BUN 24 (*)    Creatinine, Ser 1.20 (*)    Glucose, Bld 145 (*)    Calcium, Ion 1.10 (*)    All other components within normal limits    EKG: None  Radiology: CT HEAD WO CONTRAST Result Date: 08/14/2023 CLINICAL DATA:  Head trauma, moderate-severe; Polytrauma, blunt EXAM: CT HEAD WITHOUT CONTRAST CT CERVICAL SPINE WITHOUT CONTRAST TECHNIQUE: Multidetector CT imaging of the head and cervical spine was performed following the standard protocol without intravenous contrast. Multiplanar CT image reconstructions of the cervical spine were also generated. RADIATION DOSE REDUCTION: This exam was performed according to the departmental dose-optimization program which includes automated exposure control, adjustment of the mA and/or kV according to patient size and/or use of iterative reconstruction technique. COMPARISON:  None Available. FINDINGS: CT HEAD  FINDINGS Brain: No evidence of acute infarction, hemorrhage, hydrocephalus, extra-axial collection or mass lesion/mass effect. Vascular: No hyperdense vessel. Skull: High right posterior scalp contusion.  No acute fracture. Sinuses/Orbits: Clear sinuses.  No acute orbital findings. Other: No mastoid effusions. CT CERVICAL SPINE FINDINGS Alignment: No substantial sagittal subluxation. Skull base and vertebrae: No evidence of acute fracture.  Soft tissues and spinal canal: No prevertebral fluid or swelling. No visible canal hematoma. Disc levels:  Severe multilevel degenerative change. Upper chest: Visualized lung apices are clear. IMPRESSION: 1. No evidence of acute intracranial abnormality. 2. No evidence of acute fracture or traumatic malalignment in the cervical spine. 3. High right posterior scalp contusion. Electronically Signed   By: Stevenson Elbe M.D.   On: 08/14/2023 22:38   CT CERVICAL SPINE WO CONTRAST Result Date: 08/14/2023 CLINICAL DATA:  Head trauma, moderate-severe; Polytrauma, blunt EXAM: CT HEAD WITHOUT CONTRAST CT CERVICAL SPINE WITHOUT CONTRAST TECHNIQUE: Multidetector CT imaging of the head and cervical spine was performed following the standard protocol without intravenous contrast. Multiplanar CT image reconstructions of the cervical spine were also generated. RADIATION DOSE REDUCTION: This exam was performed according to the departmental dose-optimization program which includes automated exposure control, adjustment of the mA and/or kV according to patient size and/or use of iterative reconstruction technique. COMPARISON:  None Available. FINDINGS: CT HEAD FINDINGS Brain: No evidence of acute infarction, hemorrhage, hydrocephalus, extra-axial collection or mass lesion/mass effect. Vascular: No hyperdense vessel. Skull: High right posterior scalp contusion.  No acute fracture. Sinuses/Orbits: Clear sinuses.  No acute orbital findings. Other: No mastoid effusions. CT CERVICAL SPINE FINDINGS  Alignment: No substantial sagittal subluxation. Skull base and vertebrae: No evidence of acute fracture. Soft tissues and spinal canal: No prevertebral fluid or swelling. No visible canal hematoma. Disc levels:  Severe multilevel degenerative change. Upper chest: Visualized lung apices are clear. IMPRESSION: 1. No evidence of acute intracranial abnormality. 2. No evidence of acute fracture or traumatic malalignment in the cervical spine. 3. High right posterior scalp contusion. Electronically Signed   By: Stevenson Elbe M.D.   On: 08/14/2023 22:38     Procedures   Medications Ordered in the ED  fentaNYL  (SUBLIMAZE ) injection 50 mcg (has no administration in time range)                                    Medical Decision Making Amount and/or Complexity of Data Reviewed Labs: ordered. Radiology: ordered.   This patient is a 78 y.o. female  who presents to the ED for concern of head injury without LOC.   Differential diagnoses prior to evaluation: The emergent differential diagnosis includes, but is not limited to,  epidural hematoma, subdural hematoma, skull fracture, subarachnoid hemorrhage, unstable cervical spine fracture, concussion vs other MSK injury . This is not an exhaustive differential.   Past Medical History / Co-morbidities / Social History: diabetes, obesity, COPD, recent onset of A-fib who was started on Eliquis   Additional history: Chart reviewed. Pertinent results include: Reviewed lab work, imaging from her recent hospitalization for Afib RVR  Physical Exam: Physical exam performed. The pertinent findings include:  Large posterior scalp hematoma not actively bleeding.   Moves all 4 limbs spontaneously, no step-off or deformity.  She is small abrasion of right elbow but normal range of motion of elbow.   Lab Tests/Imaging studies: I personally interpreted labs/imaging and the pertinent results include: CBC notable for leukocytosis, white blood cells 14.2, suspect  secondary to trauma.  Mild anemia, hemoglobin 11.4.  Normal platelet function.  BMP very mildly elevated creatinine, BUN from recent baseline, but otherwise overall unremarkable.  CT head, CT C-spine with large posterior scalp hematoma, no head or cervical spinal injury. I agree with the radiologist interpretation.  Disposition: After consideration of the diagnostic results and the  patients response to treatment, I feel that patient with some cervical strain, posterior scalp hematoma, no other acute injury.  Will plan to discharge with plan for Tylenol , muscle relaxant, given her unsteadiness, falls I recommend that she only take it at night.Aaron Aas   emergency department workup does not suggest an emergent condition requiring admission or immediate intervention beyond what has been performed at this time. The plan is: as above. The patient is safe for discharge and has been instructed to return immediately for worsening symptoms, change in symptoms or any other concerns.  Final diagnoses:  Fall, initial encounter  Hematoma of scalp, initial encounter  Strain of neck muscle, initial encounter    ED Discharge Orders     None          Stefan Edge 08/14/23 2345    Tegeler, Marine Sia, MD 08/15/23 0002

## 2023-08-14 NOTE — ED Notes (Signed)
 Pt transported to CT 1 with TRN Verdie Gladden

## 2023-08-14 NOTE — Discharge Instructions (Signed)
 Please use Tylenol  for pain.  You may use 1000 mg of Tylenol  every 6 hours.  Not to exceed 4 g of Tylenol  within 24 hours.  You can use the muscle relaxant I am prescribing in addition to the above to help with any breakthrough pain.  You can take it up to twice daily.  It is safe to take at night, but I would be cautious taking it during the day as it can cause some drowsiness.  Make sure that you are feeling awake and alert before you get behind the wheel of a car or operate a motor vehicle.  It is not a narcotic pain medication so you are able to take it if it is not making you drowsy and still pilot a vehicle or machinery safely. -- I would only take it at night if you're unsteady on your feet.

## 2023-08-14 NOTE — Progress Notes (Signed)
 Orthopedic Tech Progress Note Patient Details:  Sarah Miller [birth year] [id number]  Patient ID: Sarah Miller, female   DOB: 1945/10/22, 78 y.o.   MRN: [id number] LV2T not needed FOT   Sarah Miller 08/14/2023, 10:50 PM

## 2023-08-15 MED ORDER — METHOCARBAMOL 500 MG PO TABS: 500.0000 mg | ORAL_TABLET | Freq: Two times a day (BID) | ORAL | 0 refills | Status: DC

## 2023-08-20 DIAGNOSIS — E119 Type 2 diabetes mellitus without complications: Secondary | ICD-10-CM | POA: Diagnosis not present

## 2023-08-20 DIAGNOSIS — I4510 Unspecified right bundle-branch block: Secondary | ICD-10-CM | POA: Diagnosis not present

## 2023-08-20 DIAGNOSIS — J449 Chronic obstructive pulmonary disease, unspecified: Secondary | ICD-10-CM | POA: Diagnosis not present

## 2023-08-20 DIAGNOSIS — D649 Anemia, unspecified: Secondary | ICD-10-CM | POA: Diagnosis not present

## 2023-08-20 DIAGNOSIS — I10 Essential (primary) hypertension: Secondary | ICD-10-CM | POA: Diagnosis not present

## 2023-08-20 DIAGNOSIS — I4891 Unspecified atrial fibrillation: Secondary | ICD-10-CM | POA: Diagnosis not present

## 2023-08-23 DIAGNOSIS — M170 Bilateral primary osteoarthritis of knee: Secondary | ICD-10-CM | POA: Diagnosis not present

## 2023-08-23 DIAGNOSIS — M533 Sacrococcygeal disorders, not elsewhere classified: Secondary | ICD-10-CM | POA: Diagnosis not present

## 2023-08-24 ENCOUNTER — Other Ambulatory Visit (HOSPITAL_COMMUNITY): Payer: Self-pay

## 2023-08-26 ENCOUNTER — Inpatient Hospital Stay: Attending: Oncology

## 2023-08-26 ENCOUNTER — Encounter: Payer: Self-pay | Admitting: Nurse Practitioner

## 2023-08-26 ENCOUNTER — Ambulatory Visit (HOSPITAL_BASED_OUTPATIENT_CLINIC_OR_DEPARTMENT_OTHER)
Admission: RE | Admit: 2023-08-26 | Discharge: 2023-08-26 | Disposition: A | Discharge status: Home / Self Care | Source: Ambulatory Visit | Attending: Acute Care | Admitting: Acute Care

## 2023-08-26 ENCOUNTER — Inpatient Hospital Stay: Admitting: Nurse Practitioner

## 2023-08-26 VITALS — BP 130/72 | HR 71 | Temp 98.0°F | Resp 18 | Ht 61.0 in | Wt 227.7 lb

## 2023-08-26 DIAGNOSIS — I4891 Unspecified atrial fibrillation: Secondary | ICD-10-CM | POA: Diagnosis not present

## 2023-08-26 DIAGNOSIS — E119 Type 2 diabetes mellitus without complications: Secondary | ICD-10-CM | POA: Insufficient documentation

## 2023-08-26 DIAGNOSIS — Z87891 Personal history of nicotine dependence: Secondary | ICD-10-CM | POA: Insufficient documentation

## 2023-08-26 DIAGNOSIS — I2510 Atherosclerotic heart disease of native coronary artery without angina pectoris: Secondary | ICD-10-CM | POA: Insufficient documentation

## 2023-08-26 DIAGNOSIS — D509 Iron deficiency anemia, unspecified: Secondary | ICD-10-CM

## 2023-08-26 DIAGNOSIS — I10 Essential (primary) hypertension: Secondary | ICD-10-CM | POA: Diagnosis not present

## 2023-08-26 DIAGNOSIS — J449 Chronic obstructive pulmonary disease, unspecified: Secondary | ICD-10-CM | POA: Insufficient documentation

## 2023-08-26 DIAGNOSIS — G4733 Obstructive sleep apnea (adult) (pediatric): Secondary | ICD-10-CM | POA: Diagnosis not present

## 2023-08-26 DIAGNOSIS — Z122 Encounter for screening for malignant neoplasm of respiratory organs: Secondary | ICD-10-CM | POA: Diagnosis not present

## 2023-08-26 DIAGNOSIS — Z7901 Long term (current) use of anticoagulants: Secondary | ICD-10-CM | POA: Insufficient documentation

## 2023-08-26 LAB — CBC WITH DIFFERENTIAL (CANCER CENTER ONLY)
Abs Immature Granulocytes: 0.24 10*3/uL — ABNORMAL HIGH (ref 0.00–0.07)
Basophils Absolute: 0 10*3/uL (ref 0.0–0.1)
Basophils Relative: 0 %
Eosinophils Absolute: 0 10*3/uL (ref 0.0–0.5)
Eosinophils Relative: 0 %
HCT: 38.5 % (ref 36.0–46.0)
Hemoglobin: 11.8 g/dL — ABNORMAL LOW (ref 12.0–15.0)
Immature Granulocytes: 2 %
Lymphocytes Relative: 8 %
Lymphs Abs: 1.3 10*3/uL (ref 0.7–4.0)
MCH: 25.5 pg — ABNORMAL LOW (ref 26.0–34.0)
MCHC: 30.6 g/dL (ref 30.0–36.0)
MCV: 83.2 fL (ref 80.0–100.0)
Monocytes Absolute: 1.2 10*3/uL — ABNORMAL HIGH (ref 0.1–1.0)
Monocytes Relative: 7 %
Neutro Abs: 13.7 10*3/uL — ABNORMAL HIGH (ref 1.7–7.7)
Neutrophils Relative %: 83 %
Platelet Count: 383 10*3/uL (ref 150–400)
RBC: 4.63 MIL/uL (ref 3.87–5.11)
RDW: 15.7 % — ABNORMAL HIGH (ref 11.5–15.5)
WBC Count: 16.4 10*3/uL — ABNORMAL HIGH (ref 4.0–10.5)
nRBC: 0 % (ref 0.0–0.2)

## 2023-08-26 LAB — FERRITIN: Ferritin: 124 ng/mL (ref 11–307)

## 2023-08-26 NOTE — Progress Notes (Signed)
  [facility] OFFICE PROGRESS NOTE   Diagnosis: Anemia  INTERVAL HISTORY:   Sarah Miller returns for follow-up of anemia.  She was last seen 12/21/2022.  Hemoglobin at that time 11.9, MCV 80, ferritin 75.  She was hospitalized 07/25/2023 through 07/29/2023 with new onset rapid atrial fibrillation.  She is now on Eliquis .  She was seen in the emergency department 08/14/2023 after a fall.  Brain and cervical spine CTs showed no evidence of acute intracranial abnormality, fracture or traumatic malalignment of the cervical spine.  She was noted to have a high right posterior scalp contusion.  She discontinued oral iron about 6 months ago.  She denies bleeding.  She has chronic bilateral knee pain.  She reports cortisone injections 08/23/2023.  Objective:  Vital signs in last 24 hours:  Blood pressure 130/72, pulse 71, temperature 98 F (36.7 C), temperature source Temporal, resp. rate 18, height 5' 1 (1.549 m), weight 227 lb 11.2 oz (103.3 kg), SpO2 96%.   She was examined in the chair as she did not feel she could safely maneuver onto the exam table.  Resp: Distant breath sounds.  No respiratory distress. Cardio: Regular rate and rhythm. GI: Abdomen soft and nontender. Vascular: No leg edema.   Lab Results:  Lab Results  Component Value Date   WBC 16.4 (H) 08/26/2023   HGB 11.8 (L) 08/26/2023   HCT 38.5 08/26/2023   MCV 83.2 08/26/2023   PLT 383 08/26/2023   NEUTROABS 13.7 (H) 08/26/2023    Imaging:  No results found.  Medications: I have reviewed the patient's current medications.  Assessment/Plan: Microcytic anemia Upper endoscopy 07/26/2020-normal esophagus, erythematous mucosa in the gastric body and antrum, normal examined duodenum (duodenum biopsy showed small bowel mucosa with no significant pathologic changes, negative for features of celiac disease; stomach biopsy with chronic inactive gastritis, negative for H. pylori organisms) Colonoscopy  07/26/2020-five 4 to 6 mm polyps in the sigmoid colon, descending colon, transverse colon and hepatic flexure, diverticulosis in the sigmoid colon and in the descending colon, nonbleeding internal hemorrhoids (tubular adenomas x2, hyperplastic polyps, polypoid colonic mucosa, negative for dysplasia). Camera endoscopy 06/13/2021-normal COPD CAD Hypertension Diabetes Neutrophilia, present for several years  Disposition: Sarah Miller remains stable from a hematologic standpoint.  She has a mild normocytic anemia, ferritin is in normal range.  She has neutrophilia which has been present for several years.  No symptoms to suggest infection.  This may be related to the frequent steroid knee injections.  She will return for lab and follow-up in approximately 3 months.  This will be just prior to the next planned steroid injections.  We are available to see her sooner if needed.    Olam Ned ANP/GNP-BC   08/26/2023  1:47 PM

## 2023-08-27 ENCOUNTER — Other Ambulatory Visit: Payer: Self-pay

## 2023-08-27 ENCOUNTER — Emergency Department (HOSPITAL_COMMUNITY)

## 2023-08-27 ENCOUNTER — Emergency Department (HOSPITAL_COMMUNITY)
Admission: EM | Admit: 2023-08-27 | Discharge: 2023-08-28 | Disposition: A | Discharge status: Home / Self Care | Attending: Emergency Medicine | Admitting: Emergency Medicine

## 2023-08-27 ENCOUNTER — Encounter (HOSPITAL_COMMUNITY): Payer: Self-pay

## 2023-08-27 ENCOUNTER — Ambulatory Visit: Admitting: Cardiology

## 2023-08-27 DIAGNOSIS — J42 Unspecified chronic bronchitis: Secondary | ICD-10-CM | POA: Diagnosis not present

## 2023-08-27 DIAGNOSIS — W01198A Fall on same level from slipping, tripping and stumbling with subsequent striking against other object, initial encounter: Secondary | ICD-10-CM | POA: Insufficient documentation

## 2023-08-27 DIAGNOSIS — S199XXA Unspecified injury of neck, initial encounter: Secondary | ICD-10-CM | POA: Diagnosis not present

## 2023-08-27 DIAGNOSIS — S0990XA Unspecified injury of head, initial encounter: Secondary | ICD-10-CM | POA: Diagnosis not present

## 2023-08-27 DIAGNOSIS — I700 Atherosclerosis of aorta: Secondary | ICD-10-CM | POA: Diagnosis not present

## 2023-08-27 DIAGNOSIS — S299XXA Unspecified injury of thorax, initial encounter: Secondary | ICD-10-CM | POA: Diagnosis not present

## 2023-08-27 DIAGNOSIS — S0511XA Contusion of eyeball and orbital tissues, right eye, initial encounter: Secondary | ICD-10-CM | POA: Diagnosis not present

## 2023-08-27 DIAGNOSIS — S0083XA Contusion of other part of head, initial encounter: Secondary | ICD-10-CM | POA: Insufficient documentation

## 2023-08-27 DIAGNOSIS — I10 Essential (primary) hypertension: Secondary | ICD-10-CM | POA: Diagnosis not present

## 2023-08-27 DIAGNOSIS — W19XXXA Unspecified fall, initial encounter: Secondary | ICD-10-CM

## 2023-08-27 DIAGNOSIS — G319 Degenerative disease of nervous system, unspecified: Secondary | ICD-10-CM | POA: Diagnosis not present

## 2023-08-27 LAB — I-STAT CHEM 8, ED
BUN: 32 mg/dL — ABNORMAL HIGH (ref 8–23)
Calcium, Ion: 1.1 mmol/L — ABNORMAL LOW (ref 1.15–1.40)
Chloride: 104 mmol/L (ref 98–111)
Creatinine, Ser: 1.1 mg/dL — ABNORMAL HIGH (ref 0.44–1.00)
Glucose, Bld: 158 mg/dL — ABNORMAL HIGH (ref 70–99)
HCT: 42 % (ref 36.0–46.0)
Hemoglobin: 14.3 g/dL (ref 12.0–15.0)
Potassium: 4 mmol/L (ref 3.5–5.1)
Sodium: 141 mmol/L (ref 135–145)
TCO2: 25 mmol/L (ref 22–32)

## 2023-08-27 LAB — COMPREHENSIVE METABOLIC PANEL WITH GFR
ALT: 19 U/L (ref 0–44)
AST: 17 U/L (ref 15–41)
Albumin: 3.5 g/dL (ref 3.5–5.0)
Alkaline Phosphatase: 73 U/L (ref 38–126)
Anion gap: 8 (ref 5–15)
BUN: 31 mg/dL — ABNORMAL HIGH (ref 8–23)
CO2: 26 mmol/L (ref 22–32)
Calcium: 9.1 mg/dL (ref 8.9–10.3)
Chloride: 104 mmol/L (ref 98–111)
Creatinine, Ser: 1.06 mg/dL — ABNORMAL HIGH (ref 0.44–1.00)
GFR, Estimated: 55 mL/min — ABNORMAL LOW (ref >60)
Glucose, Bld: 164 mg/dL — ABNORMAL HIGH (ref 70–99)
Potassium: 4.2 mmol/L (ref 3.5–5.1)
Sodium: 138 mmol/L (ref 135–145)
Total Bilirubin: 0.5 mg/dL (ref 0.0–1.2)
Total Protein: 6.2 g/dL — ABNORMAL LOW (ref 6.5–8.1)

## 2023-08-27 LAB — PROTIME-INR
INR: 1.1 (ref 0.8–1.2)
Prothrombin Time: 15 s (ref 11.4–15.2)

## 2023-08-27 LAB — SAMPLE TO BLOOD BANK

## 2023-08-27 LAB — I-STAT CG4 LACTIC ACID, ED: Lactic Acid, Venous: 1.2 mmol/L (ref 0.5–1.9)

## 2023-08-27 LAB — CBC
HCT: 42.3 % (ref 36.0–46.0)
Hemoglobin: 13 g/dL (ref 12.0–15.0)
MCH: 26 pg (ref 26.0–34.0)
MCHC: 30.7 g/dL (ref 30.0–36.0)
MCV: 84.6 fL (ref 80.0–100.0)
Platelets: 392 10*3/uL (ref 150–400)
RBC: 5 MIL/uL (ref 3.87–5.11)
RDW: 15.6 % — ABNORMAL HIGH (ref 11.5–15.5)
WBC: 19.2 10*3/uL — ABNORMAL HIGH (ref 4.0–10.5)
nRBC: 0 % (ref 0.0–0.2)

## 2023-08-27 LAB — ETHANOL: Alcohol, Ethyl (B): <15 mg/dL (ref <15)

## 2023-08-27 NOTE — ED Provider Notes (Incomplete)
 [place] EMERGENCY DEPARTMENT AT [facility] Provider Note   CSN: [id number] Arrival date & time: 08/27/23  2243     History Chief Complaint  Patient presents with  . Fall    HPI Sarah Miller is a 78 y.o. female presenting for [facility] after reaching over to grab something.   Patient's recorded medical, surgical, social, medication list and allergies were reviewed in the Snapshot window as part of the initial history.   Review of Systems   Review of Systems  Constitutional:  Negative for chills and fever.  HENT:  Negative for ear pain and sore throat.   Eyes:  Negative for pain and visual disturbance.  Respiratory:  Positive for chest tightness. Negative for cough and shortness of breath.   Cardiovascular:  Negative for chest pain and palpitations.  Gastrointestinal:  Negative for abdominal pain and vomiting.  Genitourinary:  Negative for dysuria and hematuria.  Musculoskeletal:  Negative for arthralgias and back pain.  Skin:  Negative for color change and rash.  Neurological:  Negative for seizures and syncope.  All other systems reviewed and are negative.   Physical Exam Updated Vital Signs BP (!) 136/91   Pulse (!) 112   Temp 98.3 F (36.8 C) (Temporal)   Resp (!) 28   Ht 5' 1 (1.549 m)   Wt 103.3 kg   LMP  (LMP Unknown)   SpO2 99%   BMI 43.02 kg/m  Physical Exam Vitals and nursing note reviewed.  Constitutional:      General: She is not in acute distress.    Appearance: She is well-developed.  HENT:     Head: Normocephalic and atraumatic.  Eyes:     Conjunctiva/sclera: Conjunctivae normal.  Cardiovascular:     Rate and Rhythm: Normal rate and regular rhythm.     Heart sounds: No murmur heard. Pulmonary:     Effort: Pulmonary effort is normal. No respiratory distress.     Breath sounds: Normal breath sounds.  Abdominal:     General: There is no distension.     Palpations: Abdomen is soft.     Tenderness: There is no abdominal tenderness.  There is no right CVA tenderness or left CVA tenderness.  Musculoskeletal:        General: Signs of injury (bruising and hematoma to right forehead) present. No swelling or tenderness. Normal range of motion.     Cervical back: Neck supple.  Skin:    General: Skin is warm and dry.  Neurological:     General: No focal deficit present.     Mental Status: She is alert and oriented to person, place, and time. Mental status is at baseline.     Cranial Nerves: No cranial nerve deficit.      ED Course/ Medical Decision Making/ A&P    Procedures Procedures   Medications Ordered in ED Medications - No data to display Medical Decision Making:    TONYE TANCREDI is a 78 y.o. female who presented to the ED today with a high mechanisma trauma, detailed above.    By institutional and departmental policy this was activated as a level 2 trauma. Patient placed on continuous vitals and telemetry monitoring while in ED which was reviewed periodically.   Given this mechanism of trauma, a full physical exam was performed.  Reviewed and confirmed nursing documentation for past medical history, family history, social history.    Initial Assessment/Plan:   I was called emergently to patient's bedside for a primary survey.  Primary survey: Airway intact.  BL breath sounds present.   Circulation established with WNL BP, 2 large bore IVs, and radial/femoral pulses.   Disability evaluation negative. No obvious disability requiring intervention.   Patient fully exposed and all injuries were noted, any penetrating injuries were labeled with radiopaque markers.  No emergent interventions took place in the primary survey.    Patient stable for CXR that demonstrated no traumatic hemopneumothorax EFAST deferred.   Secondary survey: Once patient was stabilized, I personally performed a secondary survey to evaluate for any other injuries.  Results of this evaluation documented in the physical exam  section. This is a patient presenting with a high mechanism trauma.  As such, I have considered intracranial injuries including intracranial hemorrhage, intrathoracic injuries including blunt myocardial or blunt lung injury, blunt abdominal injuries including aortic dissection, bladder injury, spleen injury, liver injury and I have considered orthopedic injuries including extremity or spinal injury.   This was all evaluated by the below imaging as well as concurrently ordered laboratory evaluation which was reviewed.  Radiology: All radiology results were reviewed independently and agree with reads per radiology provider. CT HEAD WO CONTRAST Result Date: 08/14/2023 CLINICAL DATA:  Head trauma, moderate-severe; Polytrauma, blunt EXAM: CT HEAD WITHOUT CONTRAST CT CERVICAL SPINE WITHOUT CONTRAST TECHNIQUE: Multidetector CT imaging of the head and cervical spine was performed following the standard protocol without intravenous contrast. Multiplanar CT image reconstructions of the cervical spine were also generated. RADIATION DOSE REDUCTION: This exam was performed according to the departmental dose-optimization program which includes automated exposure control, adjustment of the mA and/or kV according to patient size and/or use of iterative reconstruction technique. COMPARISON:  None Available. FINDINGS: CT HEAD FINDINGS Brain: No evidence of acute infarction, hemorrhage, hydrocephalus, extra-axial collection or mass lesion/mass effect. Vascular: No hyperdense vessel. Skull: High right posterior scalp contusion.  No acute fracture. Sinuses/Orbits: Clear sinuses.  No acute orbital findings. Other: No mastoid effusions. CT CERVICAL SPINE FINDINGS Alignment: No substantial sagittal subluxation. Skull base and vertebrae: No evidence of acute fracture. Soft tissues and spinal canal: No prevertebral fluid or swelling. No visible canal hematoma. Disc levels:  Severe multilevel degenerative change. Upper chest: Visualized  lung apices are clear. IMPRESSION: 1. No evidence of acute intracranial abnormality. 2. No evidence of acute fracture or traumatic malalignment in the cervical spine. 3. High right posterior scalp contusion. Electronically Signed   By: Gilmore GORMAN Molt M.D.   On: 08/14/2023 22:38   CT CERVICAL SPINE WO CONTRAST Result Date: 08/14/2023 CLINICAL DATA:  Head trauma, moderate-severe; Polytrauma, blunt EXAM: CT HEAD WITHOUT CONTRAST CT CERVICAL SPINE WITHOUT CONTRAST TECHNIQUE: Multidetector CT imaging of the head and cervical spine was performed following the standard protocol without intravenous contrast. Multiplanar CT image reconstructions of the cervical spine were also generated. RADIATION DOSE REDUCTION: This exam was performed according to the departmental dose-optimization program which includes automated exposure control, adjustment of the mA and/or kV according to patient size and/or use of iterative reconstruction technique. COMPARISON:  None Available. FINDINGS: CT HEAD FINDINGS Brain: No evidence of acute infarction, hemorrhage, hydrocephalus, extra-axial collection or mass lesion/mass effect. Vascular: No hyperdense vessel. Skull: High right posterior scalp contusion.  No acute fracture. Sinuses/Orbits: Clear sinuses.  No acute orbital findings. Other: No mastoid effusions. CT CERVICAL SPINE FINDINGS Alignment: No substantial sagittal subluxation. Skull base and vertebrae: No evidence of acute fracture. Soft tissues and spinal canal: No prevertebral fluid or swelling. No visible canal hematoma. Disc levels:  Severe multilevel degenerative change. Upper  chest: Visualized lung apices are clear. IMPRESSION: 1. No evidence of acute intracranial abnormality. 2. No evidence of acute fracture or traumatic malalignment in the cervical spine. 3. High right posterior scalp contusion. Electronically Signed   By: Gilmore GORMAN Molt M.D.   On: 08/14/2023 22:38    Final Reassessment and Plan:   ***     Disposition:   Based on the above findings, I believe this patient is stable for admission.    Patient/family educated about specific findings on our evaluation and explained exact reasons for admission.  Patient/family educated about clinical situation and time was allowed to answer questions.   Admission team communicated with and agreed with need for admission. Patient admitted. Patient ready to move at this time.     Emergency Department Medication Summary:   Medications - No data to display         Clinical Impression: No diagnosis found.   Data Unavailable   Final Clinical Impression(s) / ED Diagnoses Final diagnoses:  None    Rx / DC Orders ED Discharge Orders     None

## 2023-08-27 NOTE — ED Provider Notes (Signed)
 [place] EMERGENCY DEPARTMENT AT [facility] Provider Note   CSN: [id number] Arrival date & time: 08/27/23  2243     History Chief Complaint  Patient presents with   Fall    HPI Sarah Miller is a 78 y.o. female presenting for [facility] after reaching over to grab something. Head injury but otherwise not obvious pathology.  Patient's recorded medical, surgical, social, medication list and allergies were reviewed in the Snapshot window as part of the initial history.   Review of Systems   Review of Systems  Constitutional:  Negative for chills and fever.  HENT:  Negative for ear pain and sore throat.   Eyes:  Negative for pain and visual disturbance.  Respiratory:  Positive for chest tightness. Negative for cough and shortness of breath.   Cardiovascular:  Negative for chest pain and palpitations.  Gastrointestinal:  Negative for abdominal pain and vomiting.  Genitourinary:  Negative for dysuria and hematuria.  Musculoskeletal:  Negative for arthralgias and back pain.  Skin:  Negative for color change and rash.  Neurological:  Negative for seizures and syncope.  All other systems reviewed and are negative.   Physical Exam Updated Vital Signs BP 126/88   Pulse (!) 107   Temp 98.3 F (36.8 C) (Temporal)   Resp 19   Ht 5' 1 (1.549 m)   Wt 103.3 kg   LMP  (LMP Unknown)   SpO2 99%   BMI 43.02 kg/m  Physical Exam Vitals and nursing note reviewed.  Constitutional:      General: She is not in acute distress.    Appearance: She is well-developed.  HENT:     Head: Normocephalic and atraumatic.  Eyes:     Conjunctiva/sclera: Conjunctivae normal.  Cardiovascular:     Rate and Rhythm: Normal rate and regular rhythm.     Heart sounds: No murmur heard. Pulmonary:     Effort: Pulmonary effort is normal. No respiratory distress.     Breath sounds: Normal breath sounds.  Abdominal:     General: There is no distension.     Palpations: Abdomen is soft.      Tenderness: There is no abdominal tenderness. There is no right CVA tenderness or left CVA tenderness.  Musculoskeletal:        General: Signs of injury (bruising and hematoma to right forehead) present. No swelling or tenderness. Normal range of motion.     Cervical back: Neck supple.  Skin:    General: Skin is warm and dry.  Neurological:     General: No focal deficit present.     Mental Status: She is alert and oriented to person, place, and time. Mental status is at baseline.     Cranial Nerves: No cranial nerve deficit.      ED Course/ Medical Decision Making/ A&P    Procedures Procedures   Medications Ordered in ED Medications - No data to display Medical Decision Making:    Sarah Miller is a 78 y.o. female who presented to the ED today with a high mechanisma trauma, detailed above.    By institutional and departmental policy this was activated as a level 2 trauma. Patient placed on continuous vitals and telemetry monitoring while in ED which was reviewed periodically.   Given this mechanism of trauma, a full physical exam was performed.  Reviewed and confirmed nursing documentation for past medical history, family history, social history.    Initial Assessment/Plan:   I was called emergently to patient's bedside  for a primary survey.  Primary survey: Airway intact.  BL breath sounds present.   Circulation established with WNL BP, 2 large bore IVs, and radial/femoral pulses.   Disability evaluation negative. No obvious disability requiring intervention.   Patient fully exposed and all injuries were noted, any penetrating injuries were labeled with radiopaque markers.  No emergent interventions took place in the primary survey.    Patient stable for CXR that demonstrated no traumatic hemopneumothorax EFAST deferred.   Secondary survey: Once patient was stabilized, I personally performed a secondary survey to evaluate for any other injuries.  Results of this  evaluation documented in the physical exam section. This is a patient presenting with a high mechanism trauma.  As such, I have considered intracranial injuries including intracranial hemorrhage, intrathoracic injuries including blunt myocardial or blunt lung injury, blunt abdominal injuries including aortic dissection, bladder injury, spleen injury, liver injury and I have considered orthopedic injuries including extremity or spinal injury.   This was all evaluated by the below imaging as well as concurrently ordered laboratory evaluation which was reviewed.  Radiology: All radiology results were reviewed independently and agree with reads per radiology provider. DG Chest Port 1 View Result Date: 08/28/2023 CLINICAL DATA:  Level 2 trauma, fall on blood thinners EXAM: PORTABLE CHEST 1 VIEW COMPARISON:  07/25/2023 FINDINGS: Stable cardiomediastinal silhouette. Aortic atherosclerotic calcification. Chronic bronchitic changes. No focal consolidation, pleural effusion, or pneumothorax. No displaced rib fractures. IMPRESSION: No active disease. Electronically Signed   By: Norman Gatlin M.D.   On: 08/28/2023 00:01   CT CERVICAL SPINE WO CONTRAST Result Date: 08/28/2023 CLINICAL DATA:  Polytrauma, blunt; Head trauma, moderate-severe EXAM: CT HEAD WITHOUT CONTRAST CT CERVICAL SPINE WITHOUT CONTRAST TECHNIQUE: Multidetector CT imaging of the head and cervical spine was performed following the standard protocol without intravenous contrast. Multiplanar CT image reconstructions of the cervical spine were also generated. RADIATION DOSE REDUCTION: This exam was performed according to the departmental dose-optimization program which includes automated exposure control, adjustment of the mA and/or kV according to patient size and/or use of iterative reconstruction technique. COMPARISON:  CT head and CT cervical spine August 14, 2023. FINDINGS: CT HEAD FINDINGS Brain: No evidence of acute infarction, hemorrhage,  hydrocephalus, extra-axial collection or mass lesion/mass effect. Cerebral atrophy. Vascular: No hyperdense vessel. Skull: No acute fracture. Sinuses/Orbits: Posterior left ethmoid air cell opacification. Right periorbital contusion. Other: No mastoid effusion. CT CERVICAL SPINE FINDINGS Alignment: No substantial sagittal subluxation. Skull base and vertebrae: No acute fracture. No primary bone lesion or focal pathologic process. Soft tissues and spinal canal: No prevertebral fluid or swelling. No visible canal hematoma. Disc levels: Similar multilevel degenerative disc disease. Varying degrees of neural foraminal stenosis, likely greatest on the left at C3-C4. Upper chest: Visualized lung apices are clear. IMPRESSION: 1. No evidence of acute intracranial abnormality. 2. No evidence of acute fracture or traumatic malalignment in the cervical spine. 3. Right periorbital contusion. Electronically Signed   By: Gilmore GORMAN Molt M.D.   On: 08/28/2023 00:00   CT HEAD WO CONTRAST Result Date: 08/28/2023 CLINICAL DATA:  Polytrauma, blunt; Head trauma, moderate-severe EXAM: CT HEAD WITHOUT CONTRAST CT CERVICAL SPINE WITHOUT CONTRAST TECHNIQUE: Multidetector CT imaging of the head and cervical spine was performed following the standard protocol without intravenous contrast. Multiplanar CT image reconstructions of the cervical spine were also generated. RADIATION DOSE REDUCTION: This exam was performed according to the departmental dose-optimization program which includes automated exposure control, adjustment of the mA and/or kV according to patient size  and/or use of iterative reconstruction technique. COMPARISON:  CT head and CT cervical spine August 14, 2023. FINDINGS: CT HEAD FINDINGS Brain: No evidence of acute infarction, hemorrhage, hydrocephalus, extra-axial collection or mass lesion/mass effect. Cerebral atrophy. Vascular: No hyperdense vessel. Skull: No acute fracture. Sinuses/Orbits: Posterior left ethmoid air  cell opacification. Right periorbital contusion. Other: No mastoid effusion. CT CERVICAL SPINE FINDINGS Alignment: No substantial sagittal subluxation. Skull base and vertebrae: No acute fracture. No primary bone lesion or focal pathologic process. Soft tissues and spinal canal: No prevertebral fluid or swelling. No visible canal hematoma. Disc levels: Similar multilevel degenerative disc disease. Varying degrees of neural foraminal stenosis, likely greatest on the left at C3-C4. Upper chest: Visualized lung apices are clear. IMPRESSION: 1. No evidence of acute intracranial abnormality. 2. No evidence of acute fracture or traumatic malalignment in the cervical spine. 3. Right periorbital contusion. Electronically Signed   By: Gilmore GORMAN Molt M.D.   On: 08/28/2023 00:00   CT HEAD WO CONTRAST Result Date: 08/14/2023 CLINICAL DATA:  Head trauma, moderate-severe; Polytrauma, blunt EXAM: CT HEAD WITHOUT CONTRAST CT CERVICAL SPINE WITHOUT CONTRAST TECHNIQUE: Multidetector CT imaging of the head and cervical spine was performed following the standard protocol without intravenous contrast. Multiplanar CT image reconstructions of the cervical spine were also generated. RADIATION DOSE REDUCTION: This exam was performed according to the departmental dose-optimization program which includes automated exposure control, adjustment of the mA and/or kV according to patient size and/or use of iterative reconstruction technique. COMPARISON:  None Available. FINDINGS: CT HEAD FINDINGS Brain: No evidence of acute infarction, hemorrhage, hydrocephalus, extra-axial collection or mass lesion/mass effect. Vascular: No hyperdense vessel. Skull: High right posterior scalp contusion.  No acute fracture. Sinuses/Orbits: Clear sinuses.  No acute orbital findings. Other: No mastoid effusions. CT CERVICAL SPINE FINDINGS Alignment: No substantial sagittal subluxation. Skull base and vertebrae: No evidence of acute fracture. Soft tissues and  spinal canal: No prevertebral fluid or swelling. No visible canal hematoma. Disc levels:  Severe multilevel degenerative change. Upper chest: Visualized lung apices are clear. IMPRESSION: 1. No evidence of acute intracranial abnormality. 2. No evidence of acute fracture or traumatic malalignment in the cervical spine. 3. High right posterior scalp contusion. Electronically Signed   By: Gilmore GORMAN Molt M.D.   On: 08/14/2023 22:38   CT CERVICAL SPINE WO CONTRAST Result Date: 08/14/2023 CLINICAL DATA:  Head trauma, moderate-severe; Polytrauma, blunt EXAM: CT HEAD WITHOUT CONTRAST CT CERVICAL SPINE WITHOUT CONTRAST TECHNIQUE: Multidetector CT imaging of the head and cervical spine was performed following the standard protocol without intravenous contrast. Multiplanar CT image reconstructions of the cervical spine were also generated. RADIATION DOSE REDUCTION: This exam was performed according to the departmental dose-optimization program which includes automated exposure control, adjustment of the mA and/or kV according to patient size and/or use of iterative reconstruction technique. COMPARISON:  None Available. FINDINGS: CT HEAD FINDINGS Brain: No evidence of acute infarction, hemorrhage, hydrocephalus, extra-axial collection or mass lesion/mass effect. Vascular: No hyperdense vessel. Skull: High right posterior scalp contusion.  No acute fracture. Sinuses/Orbits: Clear sinuses.  No acute orbital findings. Other: No mastoid effusions. CT CERVICAL SPINE FINDINGS Alignment: No substantial sagittal subluxation. Skull base and vertebrae: No evidence of acute fracture. Soft tissues and spinal canal: No prevertebral fluid or swelling. No visible canal hematoma. Disc levels:  Severe multilevel degenerative change. Upper chest: Visualized lung apices are clear. IMPRESSION: 1. No evidence of acute intracranial abnormality. 2. No evidence of acute fracture or traumatic malalignment in the cervical spine. 3. High  right  posterior scalp contusion. Electronically Signed   By: Gilmore GORMAN Molt M.D.   On: 08/14/2023 22:38    Final Reassessment and Plan:   No acute pathology detected. At baseline per family.    Disposition:  I have considered need for hospitalization, however, considering all of the above, I believe this patient is stable for discharge at this time.  Patient/family educated about specific return precautions for given chief complaint and symptoms.  Patient/family educated about follow-up with PCP.     Patient/family expressed understanding of return precautions and need for follow-up. Patient spoken to regarding all imaging and laboratory results and appropriate follow up for these results. All education provided in verbal form with additional information in written form. Time was allowed for answering of patient questions. Patient discharged.    Emergency Department Medication Summary:   Medications - No data to display      Emergency Department Medication Summary:   Medications - No data to display         Clinical Impression:  1. Fall, initial encounter      Discharge   Final Clinical Impression(s) / ED Diagnoses Final diagnoses:  Fall, initial encounter    Rx / DC Orders ED Discharge Orders     None         Jerral Meth, MD 08/28/23 [id number]

## 2023-08-27 NOTE — ED Triage Notes (Signed)
 Pt arrived from home via POV s/p mechanical fall on thinners with noted head injury

## 2023-08-27 NOTE — Progress Notes (Signed)
 Orthopedic Tech Progress Note Patient Details:  Sarah Miller Apr 22, 1945 [id number]  Patient ID: Sarah Miller Sarah Miller, female   DOB: 1945-11-08, 78 y.o.   MRN: [id number] Level II; not currently needed. Sarah Miller 08/27/2023, 11:04 PM

## 2023-08-27 NOTE — ED Notes (Signed)
 Per EDP, no collar to be placed at this time.

## 2023-08-27 NOTE — ED Notes (Signed)
Pt in scanner.

## 2023-08-28 LAB — TROPONIN I (HIGH SENSITIVITY): Troponin I (High Sensitivity): 15 ng/L (ref <18)

## 2023-08-28 NOTE — ED Notes (Signed)
 Trauma Response Nurse Documentation   Sarah Miller is a 78 y.o. female arriving to [facility] ED via POV  On Eliquis  (apixaban ) daily. Trauma was activated as a Level 2 by ED charge RN based on the following trauma criteria Elderly patients > 65 with head trauma on anti-coagulation (excluding ASA).  Patient cleared for CT by Dr. Ula EDP. Pt transported to CT with trauma response nurse present to monitor. RN remained with the patient throughout their absence from the department for clinical observation.   GCS 15.   History   Past Medical History:  Diagnosis Date   Allergic rhinitis    Anxiety    Aortic atherosclerosis (HCC)    Cancer (HCC)    h/o melanoma '78 and 79   Cervical arthritis    [name], S/P injection with good results.    COPD (chronic obstructive pulmonary disease) (HCC)    with emphysema   Coronary artery calcification seen on CAT scan    Depression    and anxiety   Diabetes mellitus without complication (HCC)    Early menopause    Frozen shoulder    H/O frozen shoulder and rotator cuff tendonititis, L s/p injection   GERD (gastroesophageal reflux disease)    PONV (postoperative nausea and vomiting)    Sleep apnea    Vitamin D deficiency      Past Surgical History:  Procedure Laterality Date   ABDOMINAL HYSTERECTOMY     CATARACT EXTRACTION W/ INTRAOCULAR LENS IMPLANT Bilateral    FRACTURE SURGERY Left    wrist   MELANOMA EXCISION     REVERSE SHOULDER ARTHROPLASTY Left 12/28/2019   Procedure: REVERSE SHOULDER ARTHROPLASTY;  Surgeon: [name], MD;  Location: [facility] ORS;  Service: Orthopedics;  Laterality: Left;       Initial Focused Assessment (If applicable, or please see trauma documentation): Alert/oriented female presents via POV from home after a fall, takes eliquis . Multiple bruises to arms, face at various stages of healing.  Airway patent, BS clear No obvious uncontrolled bleeding noted GCS 15  CT's Completed:   CT Head and CT  C-Spine   Interventions:  IV start and trauma lab draw Portable chest XRAY CT head and c-spine EKG  Plan for disposition:  Discharge home   Consults completed:  none   Event Summary: Presents via POV from home after a mechanical fall, hx frequent falls. Facial bruising noted. Trauma scans unremarkable, D/C home. Family at bedside.   Bedside handoff with ED RN Almarie.    Ailany Koren O Jequan Shahin  Trauma Response RN  Please call TRN at [phone] for further assistance.

## 2023-08-29 DIAGNOSIS — R8290 Unspecified abnormal findings in urine: Secondary | ICD-10-CM | POA: Diagnosis not present

## 2023-08-29 DIAGNOSIS — R42 Dizziness and giddiness: Secondary | ICD-10-CM | POA: Diagnosis not present

## 2023-08-29 DIAGNOSIS — I959 Hypotension, unspecified: Secondary | ICD-10-CM | POA: Diagnosis not present

## 2023-08-29 DIAGNOSIS — I4891 Unspecified atrial fibrillation: Secondary | ICD-10-CM | POA: Diagnosis not present

## 2023-09-03 ENCOUNTER — Other Ambulatory Visit: Payer: Self-pay

## 2023-09-03 DIAGNOSIS — Z87891 Personal history of nicotine dependence: Secondary | ICD-10-CM

## 2023-09-03 DIAGNOSIS — Z122 Encounter for screening for malignant neoplasm of respiratory organs: Secondary | ICD-10-CM

## 2023-09-04 ENCOUNTER — Ambulatory Visit (HOSPITAL_COMMUNITY)
Admission: RE | Admit: 2023-09-04 | Discharge: 2023-09-04 | Disposition: A | Discharge status: Home / Self Care | Source: Ambulatory Visit | Attending: Physician Assistant | Admitting: Physician Assistant

## 2023-09-04 ENCOUNTER — Other Ambulatory Visit (HOSPITAL_COMMUNITY): Payer: Self-pay | Admitting: Physician Assistant

## 2023-09-04 DIAGNOSIS — R600 Localized edema: Secondary | ICD-10-CM | POA: Diagnosis not present

## 2023-09-04 DIAGNOSIS — W19XXXD Unspecified fall, subsequent encounter: Secondary | ICD-10-CM | POA: Insufficient documentation

## 2023-09-04 DIAGNOSIS — N390 Urinary tract infection, site not specified: Secondary | ICD-10-CM | POA: Diagnosis not present

## 2023-09-04 DIAGNOSIS — Z043 Encounter for examination and observation following other accident: Secondary | ICD-10-CM | POA: Diagnosis not present

## 2023-09-04 DIAGNOSIS — R071 Chest pain on breathing: Secondary | ICD-10-CM | POA: Diagnosis not present

## 2023-09-04 DIAGNOSIS — I10 Essential (primary) hypertension: Secondary | ICD-10-CM | POA: Diagnosis not present

## 2023-09-04 DIAGNOSIS — G319 Degenerative disease of nervous system, unspecified: Secondary | ICD-10-CM | POA: Diagnosis not present

## 2023-09-04 DIAGNOSIS — R9082 White matter disease, unspecified: Secondary | ICD-10-CM | POA: Insufficient documentation

## 2023-09-04 DIAGNOSIS — D649 Anemia, unspecified: Secondary | ICD-10-CM | POA: Diagnosis not present

## 2023-09-04 DIAGNOSIS — S0093XA Contusion of unspecified part of head, initial encounter: Secondary | ICD-10-CM | POA: Diagnosis not present

## 2023-09-04 DIAGNOSIS — R9089 Other abnormal findings on diagnostic imaging of central nervous system: Secondary | ICD-10-CM | POA: Diagnosis not present

## 2023-09-04 DIAGNOSIS — J449 Chronic obstructive pulmonary disease, unspecified: Secondary | ICD-10-CM | POA: Diagnosis not present

## 2023-09-04 DIAGNOSIS — E119 Type 2 diabetes mellitus without complications: Secondary | ICD-10-CM | POA: Diagnosis not present

## 2023-09-04 DIAGNOSIS — I4510 Unspecified right bundle-branch block: Secondary | ICD-10-CM | POA: Diagnosis not present

## 2023-09-04 DIAGNOSIS — R42 Dizziness and giddiness: Secondary | ICD-10-CM | POA: Insufficient documentation

## 2023-09-04 DIAGNOSIS — I4891 Unspecified atrial fibrillation: Secondary | ICD-10-CM | POA: Diagnosis not present

## 2023-09-04 DIAGNOSIS — I129 Hypertensive chronic kidney disease with stage 1 through stage 4 chronic kidney disease, or unspecified chronic kidney disease: Secondary | ICD-10-CM | POA: Diagnosis not present

## 2023-09-04 MED ORDER — GADOBUTROL 1 MMOL/ML IV SOLN
10.0000 mL | Freq: Once | INTRAVENOUS | Status: AC | PRN
  Administered 2023-09-04: 10 mL via INTRAVENOUS

## 2023-09-06 ENCOUNTER — Telehealth: Payer: Self-pay | Admitting: Cardiology

## 2023-09-06 NOTE — Telephone Encounter (Signed)
 Spoke with the patient's daughter who states that the patient has been experiencing dizziness ever since being on amiodarone . She would like to know if the patient can come off of it. She states that patient's blood pressure has been up and down. Her heart rate has been in the 70s. She has not had any recurrence of AFIB. Will send to [facility] to advise on.

## 2023-09-06 NOTE — Telephone Encounter (Signed)
 Pt c/o medication issue:  1. Name of Medication: amiodarone  (PACERONE ) 200 MG tablet   2. How are you currently taking this medication (dosage and times per day)?    3. Are you having a reaction (difficulty breathing--STAT)? no  4. What is your medication issue? Calling to see if the patient can come off the medication. Please advise

## 2023-09-10 ENCOUNTER — Encounter: Payer: Self-pay | Admitting: Neurology

## 2023-09-13 DIAGNOSIS — I10 Essential (primary) hypertension: Secondary | ICD-10-CM | POA: Diagnosis not present

## 2023-09-13 DIAGNOSIS — D649 Anemia, unspecified: Secondary | ICD-10-CM | POA: Diagnosis not present

## 2023-09-13 DIAGNOSIS — E119 Type 2 diabetes mellitus without complications: Secondary | ICD-10-CM | POA: Diagnosis not present

## 2023-09-13 DIAGNOSIS — I4891 Unspecified atrial fibrillation: Secondary | ICD-10-CM | POA: Diagnosis not present

## 2023-09-13 DIAGNOSIS — I4510 Unspecified right bundle-branch block: Secondary | ICD-10-CM | POA: Diagnosis not present

## 2023-09-13 DIAGNOSIS — J449 Chronic obstructive pulmonary disease, unspecified: Secondary | ICD-10-CM | POA: Diagnosis not present

## 2023-09-13 NOTE — Telephone Encounter (Signed)
 Apologized to the daughter for someone not getting back with her sooner.  Advised of Jessie Cleaver, NP advisement.  Daughter tells me she tapered off of it per hospital instructions.  Pt took her last dose on 7/16.  They will continue to monitor and call office back if need to address return of afib if occurs.  She is agreeable to plan  and appreciates the follow up call.

## 2023-09-19 ENCOUNTER — Ambulatory Visit

## 2023-09-19 DIAGNOSIS — I4891 Unspecified atrial fibrillation: Secondary | ICD-10-CM | POA: Diagnosis not present

## 2023-09-19 DIAGNOSIS — D649 Anemia, unspecified: Secondary | ICD-10-CM | POA: Diagnosis not present

## 2023-09-19 DIAGNOSIS — I10 Essential (primary) hypertension: Secondary | ICD-10-CM | POA: Diagnosis not present

## 2023-09-19 DIAGNOSIS — R42 Dizziness and giddiness: Secondary | ICD-10-CM | POA: Insufficient documentation

## 2023-09-19 DIAGNOSIS — E119 Type 2 diabetes mellitus without complications: Secondary | ICD-10-CM | POA: Diagnosis not present

## 2023-09-19 DIAGNOSIS — J449 Chronic obstructive pulmonary disease, unspecified: Secondary | ICD-10-CM | POA: Diagnosis not present

## 2023-09-19 DIAGNOSIS — R2681 Unsteadiness on feet: Secondary | ICD-10-CM

## 2023-09-19 DIAGNOSIS — I4510 Unspecified right bundle-branch block: Secondary | ICD-10-CM | POA: Diagnosis not present

## 2023-09-19 NOTE — Therapy (Signed)
  OUTPATIENT PHYSICAL THERAPY VESTIBULAR EVALUATION/ ARRIVED NO CHARGE     Patient Name: Sarah Miller MRN: [id number] DOB:[birth year], 78 y.o., female Today's Date: 09/19/2023  END OF SESSION:  PT End of Session - 09/19/23 0937     Visit Number 1    Number of Visits 1    Authorization Type [employer] Dual    PT Start Time [phone]   patient late   PT Stop Time 1006    PT Time Calculation (min) 29 min    Activity Tolerance Patient tolerated treatment well    Behavior During Therapy [facility] for tasks assessed/performed          Past Medical History:  Diagnosis Date   Allergic rhinitis    Anxiety    Aortic atherosclerosis (HCC)    Cancer (HCC)    h/o melanoma '78 and 79   Cervical arthritis    [name], S/P injection with good results.    COPD (chronic obstructive pulmonary disease) (HCC)    with emphysema   Coronary artery calcification seen on CAT scan    Depression    and anxiety   Diabetes mellitus without complication (HCC)    Early menopause    Frozen shoulder    H/O frozen shoulder and rotator cuff tendonititis, L s/p injection   GERD (gastroesophageal reflux disease)    PONV (postoperative nausea and vomiting)    Sleep apnea    Vitamin D deficiency    Past Surgical History:  Procedure Laterality Date   ABDOMINAL HYSTERECTOMY     CATARACT EXTRACTION W/ INTRAOCULAR LENS IMPLANT Bilateral    FRACTURE SURGERY Left    wrist   MELANOMA EXCISION     REVERSE SHOULDER ARTHROPLASTY Left 12/28/2019   Procedure: REVERSE SHOULDER ARTHROPLASTY;  Surgeon: [name], MD;  Location: [facility];  Service: Orthopedics;  Laterality: Left;   Patient Active Problem List   Diagnosis Date Noted   DM (diabetes mellitus) (HCC) 07/26/2023   Atrial fibrillation with RVR (HCC) 07/26/2023   Rapid atrial fibrillation (HCC) 07/25/2023   COPD (chronic obstructive pulmonary disease) (HCC)    Aortic atherosclerosis (HCC)    Coronary artery calcification seen on CAT scan    DOE (dyspnea  on exertion) 02/03/2019   Morbid obesity due to excess calories (HCC) 02/03/2019   Upper airway cough syndrome vs cough variant asthma 04/18/2018   RBBB 03/19/2013   Patient currently being seen by [facility] therapy for balance and so is unable to participate in OP therapies, despite it being for a new dx code. Patient and dtr made aware and verbalized understanding. Dtr stating that she will inquire about dc from [facility] PT to allow for formal vestibular assessment and tx as needed. Answered patients preliminary questions regarding crystals and other contributing factors to dizziness, including post concussion, hypofunction, etc. Patient to be seen after dc from [facility] PT.    Delon DELENA Pop, PT Delon DELENA Pop, PT, DPT, CBIS  09/19/2023, 10:08 AM

## 2023-09-20 ENCOUNTER — Ambulatory Visit: Attending: Family Medicine

## 2023-09-20 DIAGNOSIS — R2681 Unsteadiness on feet: Secondary | ICD-10-CM | POA: Diagnosis not present

## 2023-09-20 DIAGNOSIS — R42 Dizziness and giddiness: Secondary | ICD-10-CM | POA: Diagnosis not present

## 2023-09-20 NOTE — Therapy (Signed)
 OUTPATIENT PHYSICAL THERAPY VESTIBULAR EVALUATION     Patient Name: Sarah Miller MRN: [id number] DOB:07-Aug-1945, 78 y.o., female Today's Date: 09/20/2023  END OF SESSION:  PT End of Session - 09/20/23 0848     Visit Number 1    Number of Visits 17    Date for PT Re-Evaluation 11/15/23    Authorization Type [employer] Dual    PT Start Time 0846    PT Stop Time 0939    PT Time Calculation (min) 53 min    Activity Tolerance Patient tolerated treatment well    Behavior During Therapy [facility] for tasks assessed/performed          Past Medical History:  Diagnosis Date   Allergic rhinitis    Anxiety    Aortic atherosclerosis (HCC)    Cancer (HCC)    h/o melanoma '78 and 79   Cervical arthritis    [name], S/P injection with good results.    COPD (chronic obstructive pulmonary disease) (HCC)    with emphysema   Coronary artery calcification seen on CAT scan    Depression    and anxiety   Diabetes mellitus without complication (HCC)    Early menopause    Frozen shoulder    H/O frozen shoulder and rotator cuff tendonititis, L s/p injection   GERD (gastroesophageal reflux disease)    PONV (postoperative nausea and vomiting)    Sleep apnea    Vitamin D deficiency    Past Surgical History:  Procedure Laterality Date   ABDOMINAL HYSTERECTOMY     CATARACT EXTRACTION W/ INTRAOCULAR LENS IMPLANT Bilateral    FRACTURE SURGERY Left    wrist   MELANOMA EXCISION     REVERSE SHOULDER ARTHROPLASTY Left 12/28/2019   Procedure: REVERSE SHOULDER ARTHROPLASTY;  Surgeon: [name], MD;  Location: [facility];  Service: Orthopedics;  Laterality: Left;   Patient Active Problem List   Diagnosis Date Noted   DM (diabetes mellitus) (HCC) 07/26/2023   Atrial fibrillation with RVR (HCC) 07/26/2023   Rapid atrial fibrillation (HCC) 07/25/2023   COPD (chronic obstructive pulmonary disease) (HCC)    Aortic atherosclerosis (HCC)    Coronary artery calcification seen on CAT scan    DOE  (dyspnea on exertion) 02/03/2019   Morbid obesity due to excess calories (HCC) 02/03/2019   Upper airway cough syndrome vs cough variant asthma 04/18/2018   RBBB 03/19/2013    PCP: Montie Pizza, MD REFERRING PROVIDER: Montie Pizza, MD  REFERRING DIAG: R42 (ICD-10-CM) - Dizziness and giddiness   THERAPY DIAG:  Unsteadiness on feet - Plan: PT plan of care cert/re-cert  Dizziness and giddiness - Plan: PT plan of care cert/re-cert  ONSET DATE: 09/13/23 referral   Rationale for Evaluation and Treatment: Rehabilitation  SUBJECTIVE:   SUBJECTIVE STATEMENT: Patient arrives to clinic with adult dtr, using rollator. She has had multiple falls- one she fell straight back and hit the back of her head, fell on her shoulder, fell forward on her face, fell back onto her bottom. These were all caused by instability and not necessarily dizziness.  Pt accompanied by: family member  PERTINENT HISTORY: anxiety, CA (melanoma), cervical arthritis, COPD, DM, GERD, sleep apnea  PAIN:  Are you having pain? No  PRECAUTIONS: Fall   WEIGHT BEARING RESTRICTIONS: No  FALLS: Has patient fallen in last 6 months? Yes. Number of falls 8  LIVING ENVIRONMENT: Lives with: lives alone Lives in: House/apartment Stairs: Yes: External: 1 steps; none Has following equipment at home: Vannie -  2 wheeled, Walker - 4 wheeled, and bed side commode  PLOF: Requires assistive device for independence and Needs assistance with ADLs  PATIENT GOALS: to get stronger and learn balance strategies  OBJECTIVE:  Note: Objective measures were completed at Evaluation unless otherwise noted.  DIAGNOSTIC FINDINGS: 09/04/23 brain MRI IMPRESSION: 1. Age-related atrophy and mild-to-moderate periventricular and deep cerebral white matter disease.  COGNITION: Overall cognitive status: Within functional limits for tasks assessed   SENSATION: WFL  EDEMA:  Intermittent edema in B ankles  Cervical ROM:   WFL, pain  free   BED MOBILITY:  Difficult, but independent, intermittent dizziness  GAIT: Gait pattern: step through pattern, decreased stride length, decreased hip/knee flexion- Right, decreased hip/knee flexion- Left, Right foot flat, Left foot flat, trendelenburg, trunk flexed, wide BOS, poor foot clearance- Right, and poor foot clearance- Left Distance walked: clinic Assistive device utilized: Walker - 4 wheeled Level of assistance: Modified independence and SBA Comments: slow gait speed  VESTIBULAR ASSESSMENT:  GENERAL OBSERVATION: NAD, using rollator   SYMPTOM BEHAVIOR:  Subjective history: see above  Non-Vestibular symptoms: diplopia and nausea/vomiting  Type of dizziness: Spinning/Vertigo and movement  Frequency: varies  Duration: seconds- minutes  Aggravating factors: Induced by motion: turning head quickly and Worse in the dark  Relieving factors: medication and rest  Progression of symptoms: better  OCULOMOTOR EXAM:  Ocular Alignment: normal   Ocular ROM: No Limitations  Spontaneous Nystagmus: absent  Gaze-Induced Nystagmus: absent  Smooth Pursuits: saccades  Saccades: intact reported onset of nausea with downward gaze  Convergence/Divergence: 7-8 cm   VESTIBULAR - OCULAR REFLEX:   Slow VOR: Positive Left  VOR Cancellation: Normal  Head-Impulse Test: HIT Right: positive HIT Left: negative  Dynamic Visual Acuity: TBA   POSITIONAL TESTING: Right Roll Test: no nystagmus Left Roll Test: no nystagmus Right Sidelying: no nystagmus Left Sidelying: no nystagmus  MOTION SENSITIVITY:  Motion Sensitivity Quotient Intensity: 0 = none, 1 = Lightheaded, 2 = Mild, 3 = Moderate, 4 = Severe, 5 = Vomiting  Intensity  1. Sitting to supine 0  2. Supine to L side 1  3. Supine to R side 0  4. Supine to sitting 1  5. L sidelying 0  6. Up from L  0  7. R sidelying 0  8. Up from R  0  9. Sitting, head tipped to L knee   10. Head up from L knee   11. Sitting, head tipped to R  knee   12. Head up from R knee   13. Sitting head turns x5   14.Sitting head nods x5   15. In stance, 180 turn to L    16. In stance, 180 turn to R                                                                                                                                TREATMENT  Theract: -review of exam findings -review fall precautions  at home (use of grab bars, etc) - initial HEP of VOR strengthening   PATIENT EDUCATION: Education details: see above, PT POC, exam findings Person educated: Patient and Child(ren) Education method: Explanation, Demonstration, and Handouts Education comprehension: verbalized understanding and needs further education  HOME EXERCISE PROGRAM: Gaze Stabilization: Sitting    Keeping eyes on target on wall 5 feet away, tilt head down 15-30 and move head side to side for __30__ seconds. Repeat while moving head up and down for __30__ seconds. Do _3___ sessions per day. GOALS: Goals reviewed with patient? Yes  SHORT TERM GOALS: Target date: 10/18/23  Pt will be independent with initial HEP for improved symptom report and balance  Baseline: to be provided Goal status: INITIAL  2.  MCTSIB goal  Baseline: to be assessed Goal status: INITIAL  3.  ABC goal  Baseline: to be assessed Goal status: INITIAL  4.  Gait speed goal Baseline: to be assessed Goal status: INITIAL   LONG TERM GOALS: Target date: 11/15/23  Pt will be independent with final HEP for improved symptom report and balance  Baseline: to be provided Goal status: INITIAL  MCTSIB goal  Baseline: to be assessed Goal status: INITIAL  ABC goal  Baseline: to be assessed Goal status: INITIAL  Gait speed goal Baseline: to be assessed Goal status: INITIAL  ASSESSMENT:  CLINICAL IMPRESSION: Patient is a 78 y.o. female  who was seen today for physical therapy evaluation and treatment for dizziness and imbalance. She has sustained numerous falls in the recent past, most  resulting in injury, including 2 notable head injuries. She describes a recent h/o spinning dizziness when rolling over to the L, possibly consistent with BPPV. On exam today, all positional testing was negative, effectively ruling out BPPV today. However, her VOR and HIT were both impaired indicative of a vestibular hypofunction, L <R. She would benefit from skilled PT services to address the above mentioned deficits.    OBJECTIVE IMPAIRMENTS: Abnormal gait, decreased balance, decreased knowledge of condition, decreased knowledge of use of DME, decreased strength, and dizziness.   ACTIVITY LIMITATIONS: carrying, lifting, stairs, bathing, and locomotion level  PARTICIPATION LIMITATIONS: meal prep, cleaning, laundry, interpersonal relationship, driving, shopping, and community activity  PERSONAL FACTORS: Age, Fitness, Past/current experiences, Sex, Time since onset of injury/illness/exacerbation, and Transportation are also affecting patient's functional outcome.   REHAB POTENTIAL: Good  CLINICAL DECISION MAKING: Evolving/moderate complexity  EVALUATION COMPLEXITY: Moderate   PLAN:  PT FREQUENCY: 1-2x/week  PT DURATION: 8 weeks  PLANNED INTERVENTIONS: 97164- PT Re-evaluation, 97750- Physical Performance Testing, 97110-Therapeutic exercises, 97530- Therapeutic activity, [id number]- Neuromuscular re-education, 97535- Self Care, 02859- Manual therapy, [phone]- Gait training, [phone]- Orthotic Initial, [phone]- Orthotic/Prosthetic subsequent, [phone]- Canalith repositioning, [phone]- Aquatic Therapy, Patient/Family education, Balance training, Stair training, Vestibular training, Visual/preceptual remediation/compensation, Cognitive remediation, and DME instructions  PLAN FOR NEXT SESSION: gait speed, ABC, MCTSIB + goal, VOR, balance, righting reactions   Delon DELENA Pop, PT Delon DELENA Pop, PT, DPT, CBIS  09/20/2023, 11:01 AM

## 2023-09-24 ENCOUNTER — Ambulatory Visit

## 2023-09-24 VITALS — BP 116/67 | HR 75

## 2023-09-24 DIAGNOSIS — R42 Dizziness and giddiness: Secondary | ICD-10-CM | POA: Diagnosis not present

## 2023-09-24 DIAGNOSIS — R2681 Unsteadiness on feet: Secondary | ICD-10-CM | POA: Diagnosis not present

## 2023-09-24 NOTE — Therapy (Addendum)
 OUTPATIENT PHYSICAL THERAPY VESTIBULAR TREATMENT     Patient Name: Sarah Miller MRN: [id number] DOB:1946/02/01, 78 y.o., female Today's Date: 09/24/2023  END OF SESSION:  PT End of Session - 09/24/23 0935     Visit Number 2    Number of Visits 17    Date for PT Re-Evaluation 11/15/23    Authorization Type [employer] Dual    PT Start Time 0932    PT Stop Time 1016    PT Time Calculation (min) 44 min    Activity Tolerance Patient tolerated treatment well    Behavior During Therapy WFL for tasks assessed/performed          Past Medical History:  Diagnosis Date   Allergic rhinitis    Anxiety    Aortic atherosclerosis (HCC)    Cancer (HCC)    h/o melanoma '78 and 79   Cervical arthritis    [name], S/P injection with good results.    COPD (chronic obstructive pulmonary disease) (HCC)    with emphysema   Coronary artery calcification seen on CAT scan    Depression    and anxiety   Diabetes mellitus without complication (HCC)    Early menopause    Frozen shoulder    H/O frozen shoulder and rotator cuff tendonititis, L s/p injection   GERD (gastroesophageal reflux disease)    PONV (postoperative nausea and vomiting)    Sleep apnea    Vitamin D deficiency    Past Surgical History:  Procedure Laterality Date   ABDOMINAL HYSTERECTOMY     CATARACT EXTRACTION W/ INTRAOCULAR LENS IMPLANT Bilateral    FRACTURE SURGERY Left    wrist   MELANOMA EXCISION     REVERSE SHOULDER ARTHROPLASTY Left 12/28/2019   Procedure: REVERSE SHOULDER ARTHROPLASTY;  Surgeon: [name], MD;  Location: [facility];  Service: Orthopedics;  Laterality: Left;   Patient Active Problem List   Diagnosis Date Noted   DM (diabetes mellitus) (HCC) 07/26/2023   Atrial fibrillation with RVR (HCC) 07/26/2023   Rapid atrial fibrillation (HCC) 07/25/2023   COPD (chronic obstructive pulmonary disease) (HCC)    Aortic atherosclerosis (HCC)    Coronary artery calcification seen on CAT scan    DOE  (dyspnea on exertion) 02/03/2019   Morbid obesity due to excess calories (HCC) 02/03/2019   Upper airway cough syndrome vs cough variant asthma 04/18/2018   RBBB 03/19/2013    PCP: Montie Pizza, MD REFERRING PROVIDER: Montie Pizza, MD  REFERRING DIAG: R42 (ICD-10-CM) - Dizziness and giddiness   THERAPY DIAG:  Unsteadiness on feet  Dizziness and giddiness  ONSET DATE: 09/13/23 referral   Rationale for Evaluation and Treatment: Rehabilitation  SUBJECTIVE:   SUBJECTIVE STATEMENT: Patient arrives to clinic with daughter. Reports that today is not a good day. Woke up feeling nauseous, but denies dizziness. Denies falls. Per patient, her BG is 115. Patient reporting L eye diplopia unresolved with R eye occluded. Pt accompanied by: family member  PERTINENT HISTORY: anxiety, CA (melanoma), cervical arthritis, COPD, DM, GERD, sleep apnea  PAIN:  Are you having pain? No  PRECAUTIONS: Fall   PATIENT GOALS: to get stronger and learn balance strategies  OBJECTIVE:  Note: Objective measures were completed at Evaluation unless otherwise noted.  DIAGNOSTIC FINDINGS: 09/04/23 brain MRI IMPRESSION: 1. Age-related atrophy and mild-to-moderate periventricular and deep cerebral white matter disease.   VESTIBULAR ASSESSMENT:  MCTSIB:  -condition 1: 6s, LOB to the L  -condition 2: 2s, LOB to the L  MOTION SENSITIVITY:  Motion Sensitivity Quotient Intensity: 0 = none, 1 = Lightheaded, 2 = Mild, 3 = Moderate, 4 = Severe, 5 = Vomiting  Intensity  1. Sitting to supine 0  2. Supine to L side 1  3. Supine to R side 0  4. Supine to sitting 1  5. L sidelying 0  6. Up from L  0  7. R sidelying 0  8. Up from R  0  9. Sitting, head tipped to L knee   10. Head up from L knee   11. Sitting, head tipped to R knee   12. Head up from R knee   13. Sitting head turns x5   14.Sitting head nods x5   15. In stance, 180 turn to L    16. In stance, 180 turn to R                                                                                                                                 TREATMENT  Theract: -MCTSIB  -review of impact of diplopia  -seated NPC with weight dowels-> tooth picks   -34cm  -VOR x1 horizontal seated with Max verbal cues and demonstration for accuracy -monocular horizontal Hart Chart seated   [facility] PT Assessment - 09/24/23 0001       Standardized Balance Assessment   Standardized Balance Assessment 10 meter walk test    10 Meter Walk .66m/s   rollator + CGA         PATIENT EDUCATION: Education details: continue HEP Person educated: Patient and Child(ren) Education method: Explanation, Demonstration, and Handouts Education comprehension: verbalized understanding and needs further education  HOME EXERCISE PROGRAM: Gaze Stabilization: Sitting    Keeping eyes on target on wall 5 feet away, tilt head down 15-30 and move head side to side for __30__ seconds. Repeat while moving head up and down for __30__ seconds. Do _3___ sessions per day. GOALS: Goals reviewed with patient? Yes  SHORT TERM GOALS: Target date: 10/18/23  Pt will be independent with initial HEP for improved symptom report and balance  Baseline: to be provided Goal status: INITIAL  2.  Patient will complete >/=15s of condition 1 of MCTSIB to demonstrate improved balance Baseline: 6s Goal status: REVISED  3.  ABC goal  Baseline: to be assessed Goal status: INITIAL  4.  Pt will improve gait speed to >/= .54m/s to demonstrate improved community ambulation  Baseline: .24m/s Goal status: REVISED   LONG TERM GOALS: Target date: 11/15/23  Pt will be independent with final HEP for improved symptom report and balance  Baseline: to be provided Goal status: INITIAL  Patient will complete >/=30s of condition 1 of MCTSIB to demonstrate improved balance Baseline: 2s Goal status: REVISED  ABC goal  Baseline: to be assessed Goal status: INITIAL  Pt will  improve gait speed to >/= .25m/s to demonstrate improved community ambulation  Baseline: .58m/s Goal status: REVISED  ASSESSMENT:  CLINICAL IMPRESSION: Patient seen for skilled PT session with emphasis on progressing vestibular retraining. Session slightly limited by patients nausea, diplopia and general fatigue. Her results on the MCTSIB demonstrate profoundly impaired static standing balance on solid ground with EO and EC. Discussed the functional implications of this (environment modifications to minimize fall risk). 10 Meter Walk Test: Patient instructed to walk 10 meters (32.8 ft) as quickly and as safely as possible at their normal speed x2 and at a fast speed x2. Time measured from 2 meter mark to 8 meter mark to accommodate ramp-up and ramp-down.  Normal speed: .22m/s Cut off scores: <0.4 m/s = household Ambulator, 0.4-0.8 m/s = limited community Ambulator, >0.8 m/s = community Ambulator, >1.2 m/s = crossing a street, <1.0 = increased fall risk MCID 0.05 m/s (small), 0.13 m/s (moderate), 0.06 m/s (significant)  (ANPTA Core Set of Outcome Measures for Adults with Neurologic Conditions, 2018) Patient and dtr verbalized understanding. Continue POC.   OBJECTIVE IMPAIRMENTS: Abnormal gait, decreased balance, decreased knowledge of condition, decreased knowledge of use of DME, decreased strength, and dizziness.   ACTIVITY LIMITATIONS: carrying, lifting, stairs, bathing, and locomotion level  PARTICIPATION LIMITATIONS: meal prep, cleaning, laundry, interpersonal relationship, driving, shopping, and community activity  PERSONAL FACTORS: Age, Fitness, Past/current experiences, Sex, Time since onset of injury/illness/exacerbation, and Transportation are also affecting patient's functional outcome.   REHAB POTENTIAL: Good  CLINICAL DECISION MAKING: Evolving/moderate complexity  EVALUATION COMPLEXITY: Moderate   PLAN:  PT FREQUENCY: 1-2x/week  PT DURATION: 8 weeks  PLANNED  INTERVENTIONS: 97164- PT Re-evaluation, 97750- Physical Performance Testing, 97110-Therapeutic exercises, 97530- Therapeutic activity, [id number]- Neuromuscular re-education, 97535- Self Care, 02859- Manual therapy, [id number]- Gait training, [phone]- Orthotic Initial, [phone]- Orthotic/Prosthetic subsequent, [phone]- Canalith repositioning, [phone]- Aquatic Therapy, Patient/Family education, Balance training, Stair training, Vestibular training, Visual/preceptual remediation/compensation, Cognitive remediation, and DME instructions  PLAN FOR NEXT SESSION:  ABC+ goal, VOR, balance, righting reactions, did she make an eye dr appt?   Delon DELENA Pop, PT Delon DELENA Pop, PT, DPT, CBIS  09/24/2023, 10:36 AM

## 2023-09-30 ENCOUNTER — Telehealth: Payer: Self-pay | Admitting: Cardiology

## 2023-09-30 NOTE — Telephone Encounter (Signed)
*  STAT* If patient is at the pharmacy, call can be transferred to refill team.   1. Which medications need to be refilled? (please list name of each medication and dose if known) apixaban (ELIQUIS) 5 MG TABS tablet   2. Which pharmacy/location (including street and city if local pharmacy) is medication to be sent to? [id number]- [place], [place] - [address]   3. Do they need a 30 day or 90 day supply? [address]

## 2023-10-01 ENCOUNTER — Other Ambulatory Visit: Payer: Self-pay | Admitting: *Deleted

## 2023-10-01 DIAGNOSIS — I4891 Unspecified atrial fibrillation: Secondary | ICD-10-CM

## 2023-10-01 MED ORDER — APIXABAN 5 MG PO TABS: 5.0000 mg | ORAL_TABLET | Freq: Two times a day (BID) | ORAL | 1 refills | Status: DC

## 2023-10-01 NOTE — Telephone Encounter (Signed)
 Eliquis  5mg  refill request received. Patient is 78 years old, weight-103.3kg, Crea-1.10 on 08/27/23, Diagnosis-Afib, and last seen by Josefa Beauvais on 08/12/23. Dose is appropriate based on dosing criteria. Will send in refill to requested pharmacy.

## 2023-10-04 ENCOUNTER — Ambulatory Visit

## 2023-10-11 ENCOUNTER — Ambulatory Visit: Attending: Family Medicine

## 2023-10-11 DIAGNOSIS — R42 Dizziness and giddiness: Secondary | ICD-10-CM | POA: Insufficient documentation

## 2023-10-11 DIAGNOSIS — R2681 Unsteadiness on feet: Secondary | ICD-10-CM | POA: Insufficient documentation

## 2023-10-11 NOTE — Therapy (Signed)
 OUTPATIENT PHYSICAL THERAPY VESTIBULAR TREATMENT     Patient Name: Sarah Miller MRN: [id number] DOB:1946-02-13, 78 y.o., female Today's Date: 10/11/2023  END OF SESSION:  PT End of Session - 10/11/23 0802     Visit Number 3    Number of Visits 17    Date for PT Re-Evaluation 11/15/23    Authorization Type [employer] Dual    PT Start Time 0800    PT Stop Time 0844    PT Time Calculation (min) 44 min    Equipment Utilized During Treatment Gait belt    Activity Tolerance Patient tolerated treatment well    Behavior During Therapy WFL for tasks assessed/performed;Impulsive          Past Medical History:  Diagnosis Date   Allergic rhinitis    Anxiety    Aortic atherosclerosis (HCC)    Cancer (HCC)    h/o melanoma '78 and 79   Cervical arthritis    [name], S/P injection with good results.    COPD (chronic obstructive pulmonary disease) (HCC)    with emphysema   Coronary artery calcification seen on CAT scan    Depression    and anxiety   Diabetes mellitus without complication (HCC)    Early menopause    Frozen shoulder    H/O frozen shoulder and rotator cuff tendonititis, L s/p injection   GERD (gastroesophageal reflux disease)    PONV (postoperative nausea and vomiting)    Sleep apnea    Vitamin D deficiency    Past Surgical History:  Procedure Laterality Date   ABDOMINAL HYSTERECTOMY     CATARACT EXTRACTION W/ INTRAOCULAR LENS IMPLANT Bilateral    FRACTURE SURGERY Left    wrist   MELANOMA EXCISION     REVERSE SHOULDER ARTHROPLASTY Left 12/28/2019   Procedure: REVERSE SHOULDER ARTHROPLASTY;  Surgeon: [name], MD;  Location: [facility];  Service: Orthopedics;  Laterality: Left;   Patient Active Problem List   Diagnosis Date Noted   DM (diabetes mellitus) (HCC) 07/26/2023   Atrial fibrillation with RVR (HCC) 07/26/2023   Rapid atrial fibrillation (HCC) 07/25/2023   COPD (chronic obstructive pulmonary disease) (HCC)    Aortic atherosclerosis (HCC)     Coronary artery calcification seen on CAT scan    DOE (dyspnea on exertion) 02/03/2019   Morbid obesity due to excess calories (HCC) 02/03/2019   Upper airway cough syndrome vs cough variant asthma 04/18/2018   RBBB 03/19/2013    PCP: Montie Pizza, MD REFERRING PROVIDER: Montie Pizza, MD  REFERRING DIAG: R42 (ICD-10-CM) - Dizziness and giddiness   THERAPY DIAG:  Unsteadiness on feet  Dizziness and giddiness  ONSET DATE: 09/13/23 referral   Rationale for Evaluation and Treatment: Rehabilitation  SUBJECTIVE:   SUBJECTIVE STATEMENT: Patient arrives to clinic with dtr. 2 Fridays ago she was backing up to sit on a chair, missed it and landed on the floor. Denies injury. Feeling better compared to last Friday where she couldn't get her brain right. She does have an eye dr appt next Friday.  Pt accompanied by: family member  PERTINENT HISTORY: anxiety, CA (melanoma), cervical arthritis, COPD, DM, GERD, sleep apnea  PAIN:  Are you having pain? No  PRECAUTIONS: Fall   PATIENT GOALS: to get stronger and learn balance strategies  OBJECTIVE:  Note: Objective measures were completed at Evaluation unless otherwise noted.  DIAGNOSTIC FINDINGS: 09/04/23 brain MRI IMPRESSION: 1. Age-related atrophy and mild-to-moderate periventricular and deep cerebral white matter disease.   VESTIBULAR ASSESSMENT:  MCTSIB:  -  condition 1: 6s, LOB to the L  -condition 2: 2s, LOB to the L     MOTION SENSITIVITY:  Motion Sensitivity Quotient Intensity: 0 = none, 1 = Lightheaded, 2 = Mild, 3 = Moderate, 4 = Severe, 5 = Vomiting  Intensity  1. Sitting to supine 0  2. Supine to L side 1  3. Supine to R side 0  4. Supine to sitting 1  5. L sidelying 0  6. Up from L  0  7. R sidelying 0  8. Up from R  0  9. Sitting, head tipped to L knee   10. Head up from L knee   11. Sitting, head tipped to R knee   12. Head up from R knee   13. Sitting head turns x5   14.Sitting head nods x5    15. In stance, 180 turn to L    16. In stance, 180 turn to R                                                                                                                                TREATMENT  Theract: -ABC: 26.4%  -standing VOR horizontal/ vertical   -limited by attention  -blocked practice household gait + turn and sit with rollator   -emphasis on reaching back and locking brakes -standing on foam VOR horizontal/vertical in // bars -anterior/posterior rocking standing on foam in // bars     PATIENT EDUCATION: Education details: added to HEP, importance of reaching back prior to sitting  Person educated: Patient and Child(ren) Education method: Explanation, Demonstration, and Handouts Education comprehension: verbalized understanding and needs further education  HOME EXERCISE PROGRAM: Gaze Stabilization: Sitting    Keeping eyes on target on wall 5 feet away, tilt head down 15-30 and move head side to side for __30__ seconds. Repeat while moving head up and down for __30__ seconds. Do _3___ sessions per day. Access Code: [email or web address] URL: [email or web address] Date: 10/11/2023 Prepared by: Delon Pop  Exercises - Sit to Stand with Counter Support  - 1 x daily - 7 x weekly - 3 sets - 5 reps - Forward and Backward Step Over with Counter Support  - 1 x daily - 7 x weekly - 3 sets - 10 reps GOALS: Goals reviewed with patient? Yes  SHORT TERM GOALS: Target date: 10/18/23  Pt will be independent with initial HEP for improved symptom report and balance  Baseline: to be provided Goal status: INITIAL  2.  Patient will complete >/=15s of condition 1 of MCTSIB to demonstrate improved balance Baseline: 6s Goal status: REVISED  3.  Patient will improve ABC score to >/= 50% to demonstrate improved confidence in her mobility  Baseline: 26.4% Goal status: REVISED  4.  Pt will improve gait speed to >/= .23m/s to demonstrate improved community  ambulation  Baseline: .70m/s Goal status: REVISED   LONG TERM GOALS: Target date: 11/15/23  Pt will be independent  with final HEP for improved symptom report and balance  Baseline: to be provided Goal status: INITIAL  Patient will complete >/=30s of condition 1 of MCTSIB to demonstrate improved balance Baseline: 2s Goal status: REVISED  Patient will improve ABC score to >/= 65% to demonstrate improved confidence with mobility  Baseline: 26.4% Goal status: REVISED  Pt will improve gait speed to >/= .4m/s to demonstrate improved community ambulation  Baseline: .68m/s Goal status: REVISED  ASSESSMENT:  CLINICAL IMPRESSION: Patient seen for skilled PT session with emphasis on continuing vestibular and balance retraining. Progress with VOR tasks is slightly limited by patients distractibility. Very limited ankle strategy implemented when on compliant surfaces, so focus remained on stepping strategies. For any standing exercises at home, she will require at least supervision. Patient and dtr verbalized understanding. Continue POC.   OBJECTIVE IMPAIRMENTS: Abnormal gait, decreased balance, decreased knowledge of condition, decreased knowledge of use of DME, decreased strength, and dizziness.   ACTIVITY LIMITATIONS: carrying, lifting, stairs, bathing, and locomotion level  PARTICIPATION LIMITATIONS: meal prep, cleaning, laundry, interpersonal relationship, driving, shopping, and community activity  PERSONAL FACTORS: Age, Fitness, Past/current experiences, Sex, Time since onset of injury/illness/exacerbation, and Transportation are also affecting patient's functional outcome.   REHAB POTENTIAL: Good  CLINICAL DECISION MAKING: Evolving/moderate complexity  EVALUATION COMPLEXITY: Moderate   PLAN:  PT FREQUENCY: 1-2x/week  PT DURATION: 8 weeks  PLANNED INTERVENTIONS: 97164- PT Re-evaluation, 97750- Physical Performance Testing, 97110-Therapeutic exercises, 97530- Therapeutic  activity, 97112- Neuromuscular re-education, 97535- Self Care, 02859- Manual therapy, [phone]- Gait training, [phone]- Orthotic Initial, [phone]- Orthotic/Prosthetic subsequent, [phone]- Canalith repositioning, [phone]- Aquatic Therapy, Patient/Family education, Balance training, Stair training, Vestibular training, Visual/preceptual remediation/compensation, Cognitive remediation, and DME instructions  PLAN FOR NEXT SESSION:  VOR, balance, righting/stepping reactions (esp posteriorly), gait with head turns, could continue blocked practice TUG with emphasis on reaching back for chair to sit as many of her falls have been missing the chair   Delon DELENA Pop, PT Delon DELENA Pop, PT, DPT, CBIS  10/11/2023, 9:07 AM

## 2023-10-18 ENCOUNTER — Ambulatory Visit: Admitting: Physical Therapy

## 2023-10-25 ENCOUNTER — Ambulatory Visit

## 2023-10-25 DIAGNOSIS — N3000 Acute cystitis without hematuria: Secondary | ICD-10-CM | POA: Diagnosis not present

## 2023-10-25 DIAGNOSIS — N183 Chronic kidney disease, stage 3 unspecified: Secondary | ICD-10-CM | POA: Diagnosis not present

## 2023-10-25 DIAGNOSIS — E1169 Type 2 diabetes mellitus with other specified complication: Secondary | ICD-10-CM | POA: Diagnosis not present

## 2023-10-25 DIAGNOSIS — R2681 Unsteadiness on feet: Secondary | ICD-10-CM

## 2023-10-25 DIAGNOSIS — I129 Hypertensive chronic kidney disease with stage 1 through stage 4 chronic kidney disease, or unspecified chronic kidney disease: Secondary | ICD-10-CM | POA: Diagnosis not present

## 2023-10-25 DIAGNOSIS — I700 Atherosclerosis of aorta: Secondary | ICD-10-CM | POA: Diagnosis not present

## 2023-10-25 DIAGNOSIS — R42 Dizziness and giddiness: Secondary | ICD-10-CM | POA: Diagnosis not present

## 2023-10-25 DIAGNOSIS — I480 Paroxysmal atrial fibrillation: Secondary | ICD-10-CM | POA: Diagnosis not present

## 2023-10-25 DIAGNOSIS — J439 Emphysema, unspecified: Secondary | ICD-10-CM | POA: Diagnosis not present

## 2023-10-25 DIAGNOSIS — R82998 Other abnormal findings in urine: Secondary | ICD-10-CM | POA: Diagnosis not present

## 2023-10-25 DIAGNOSIS — E785 Hyperlipidemia, unspecified: Secondary | ICD-10-CM | POA: Diagnosis not present

## 2023-10-25 NOTE — Therapy (Signed)
 OUTPATIENT PHYSICAL THERAPY VESTIBULAR TREATMENT     Patient Name: Sarah Miller MRN: [id number] DOB:1945/09/04, 78 y.o., female Today's Date: 10/25/2023  END OF SESSION:  PT End of Session - 10/25/23 0807     Visit Number 4    Number of Visits 17    Date for PT Re-Evaluation 11/15/23    Authorization Type [employer] Dual    Progress Note Due on Visit 10    PT Start Time 0807   patient late   PT Stop Time 0850    PT Time Calculation (min) 43 min    Equipment Utilized During Treatment Gait belt    Activity Tolerance Patient tolerated treatment well    Behavior During Therapy Impulsive          Past Medical History:  Diagnosis Date   Allergic rhinitis    Anxiety    Aortic atherosclerosis (HCC)    Cancer (HCC)    h/o melanoma '78 and 79   Cervical arthritis    [name], S/P injection with good results.    COPD (chronic obstructive pulmonary disease) (HCC)    with emphysema   Coronary artery calcification seen on CAT scan    Depression    and anxiety   Diabetes mellitus without complication (HCC)    Early menopause    Frozen shoulder    H/O frozen shoulder and rotator cuff tendonititis, L s/p injection   GERD (gastroesophageal reflux disease)    PONV (postoperative nausea and vomiting)    Sleep apnea    Vitamin D deficiency    Past Surgical History:  Procedure Laterality Date   ABDOMINAL HYSTERECTOMY     CATARACT EXTRACTION W/ INTRAOCULAR LENS IMPLANT Bilateral    FRACTURE SURGERY Left    wrist   MELANOMA EXCISION     REVERSE SHOULDER ARTHROPLASTY Left 12/28/2019   Procedure: REVERSE SHOULDER ARTHROPLASTY;  Surgeon: [name], MD;  Location: [facility];  Service: Orthopedics;  Laterality: Left;   Patient Active Problem List   Diagnosis Date Noted   DM (diabetes mellitus) (HCC) 07/26/2023   Atrial fibrillation with RVR (HCC) 07/26/2023   Rapid atrial fibrillation (HCC) 07/25/2023   COPD (chronic obstructive pulmonary disease) (HCC)    Aortic  atherosclerosis (HCC)    Coronary artery calcification seen on CAT scan    DOE (dyspnea on exertion) 02/03/2019   Morbid obesity due to excess calories (HCC) 02/03/2019   Upper airway cough syndrome vs cough variant asthma 04/18/2018   RBBB 03/19/2013    PCP: Montie Pizza, MD REFERRING PROVIDER: Montie Pizza, MD  REFERRING DIAG: R42 (ICD-10-CM) - Dizziness and giddiness   THERAPY DIAG:  Unsteadiness on feet  Dizziness and giddiness  ONSET DATE: 09/13/23 referral   Rationale for Evaluation and Treatment: Rehabilitation  SUBJECTIVE:   SUBJECTIVE STATEMENT: Patient arrives with dtr using rollator. Reports 3 falls since last visit. 1 in her hallway when walking and trying to take her shirt off, 1 she missed the edge of her bed and 3rd she was adjusting her toilet riser and sat back on the floor. Also has a bruise over her R eye, but reports it is from her phone and not from a fall.  Pt accompanied by: family member  PERTINENT HISTORY: anxiety, CA (melanoma), cervical arthritis, COPD, DM, GERD, sleep apnea  PAIN:  Are you having pain? No  PRECAUTIONS: Fall   PATIENT GOALS: to get stronger and learn balance strategies  OBJECTIVE:  Note: Objective measures were completed at Evaluation unless  otherwise noted.  DIAGNOSTIC FINDINGS: 09/04/23 brain MRI IMPRESSION: 1. Age-related atrophy and mild-to-moderate periventricular and deep cerebral white matter disease.   VESTIBULAR ASSESSMENT:  MCTSIB:  -condition 1: 6s, LOB to the L  -condition 2: 2s, LOB to the L     MOTION SENSITIVITY:  Motion Sensitivity Quotient Intensity: 0 = none, 1 = Lightheaded, 2 = Mild, 3 = Moderate, 4 = Severe, 5 = Vomiting  Intensity  1. Sitting to supine 0  2. Supine to L side 1  3. Supine to R side 0  4. Supine to sitting 1  5. L sidelying 0  6. Up from L  0  7. R sidelying 0  8. Up from R  0  9. Sitting, head tipped to L knee   10. Head up from L knee   11. Sitting, head tipped to  R knee   12. Head up from R knee   13. Sitting head turns x5   14.Sitting head nods x5   15. In stance, 180 turn to L    16. In stance, 180 turn to R                                                                                                                                TREATMENT  Theract: -tub transfer bench demonstration due to concern over standing in the shower holding onto towel rack  -limited attention has significantly impaired patients ability to 1. Participate in therapy and 2. Maintain safety with mobility  -trial RW x115' ran into 3 items  -required CGA and multimodal cues to avoid obstacles  -no apparent visual deficits, but rather poor attention and problem solving  -provided appropriate support to patient and family navigating fluid caregiver roles    PATIENT EDUCATION: Education details: continue HEP, see above Person educated: Patient and Child(ren) Education method: Explanation, Demonstration, and Handouts Education comprehension: verbalized understanding and needs further education  HOME EXERCISE PROGRAM: Gaze Stabilization: Sitting    Keeping eyes on target on wall 5 feet away, tilt head down 15-30 and move head side to side for __30__ seconds. Repeat while moving head up and down for __30__ seconds. Do _3___ sessions per day. Access Code: [email or web address] URL: [email or web address] Date: 10/11/2023 Prepared by: Delon Pop  Exercises - Sit to Stand with Counter Support  - 1 x daily - 7 x weekly - 3 sets - 5 reps - Forward and Backward Step Over with Counter Support  - 1 x daily - 7 x weekly - 3 sets - 10 reps GOALS: Goals reviewed with patient? Yes  SHORT TERM GOALS: Target date: 10/18/23  Pt will be independent with initial HEP for improved symptom report and balance  Baseline: to be provided Goal status: INITIAL  2.  Patient will complete >/=15s of condition 1 of MCTSIB to demonstrate improved balance Baseline: 6s Goal  status: REVISED  3.  Patient will improve ABC score to >/= 50%  to demonstrate improved confidence in her mobility  Baseline: 26.4% Goal status: REVISED  4.  Pt will improve gait speed to >/= .48m/s to demonstrate improved community ambulation  Baseline: .12m/s Goal status: REVISED   LONG TERM GOALS: Target date: 11/15/23  Pt will be independent with final HEP for improved symptom report and balance  Baseline: to be provided Goal status: INITIAL  Patient will complete >/=30s of condition 1 of MCTSIB to demonstrate improved balance Baseline: 2s Goal status: REVISED  Patient will improve ABC score to >/= 65% to demonstrate improved confidence with mobility  Baseline: 26.4% Goal status: REVISED  Pt will improve gait speed to >/= .53m/s to demonstrate improved community ambulation  Baseline: .49m/s Goal status: REVISED  ASSESSMENT:  CLINICAL IMPRESSION: Patient seen for skilled PT session with emphasis on patient and family education and trialing safer AD. She continues to be most limited by poor attention and problem solving. These seem to be the primary causes of her falls. She remains functionally strong. Her balance, while impaired, is most impacted by her impulsivity and poor problem solving/attention. She consistently attempts to dual/multitask and that results in greater instances of imbalance. Discussed various methods to address this including ST and medication management from PCP. Continue POC.   OBJECTIVE IMPAIRMENTS: Abnormal gait, decreased balance, decreased knowledge of condition, decreased knowledge of use of DME, decreased strength, and dizziness.   ACTIVITY LIMITATIONS: carrying, lifting, stairs, bathing, and locomotion level  PARTICIPATION LIMITATIONS: meal prep, cleaning, laundry, interpersonal relationship, driving, shopping, and community activity  PERSONAL FACTORS: Age, Fitness, Past/current experiences, Sex, Time since onset of injury/illness/exacerbation,  and Transportation are also affecting patient's functional outcome.   REHAB POTENTIAL: Good  CLINICAL DECISION MAKING: Evolving/moderate complexity  EVALUATION COMPLEXITY: Moderate   PLAN:  PT FREQUENCY: 1-2x/week  PT DURATION: 8 weeks  PLANNED INTERVENTIONS: 97164- PT Re-evaluation, 97750- Physical Performance Testing, 97110-Therapeutic exercises, 97530- Therapeutic activity, [id number]- Neuromuscular re-education, 97535- Self Care, 02859- Manual therapy, [id number]- Gait training, [phone]- Orthotic Initial, [phone]- Orthotic/Prosthetic subsequent, [phone]- Canalith repositioning, [phone]- Aquatic Therapy, Patient/Family education, Balance training, Stair training, Vestibular training, Visual/preceptual remediation/compensation, Cognitive remediation, and DME instructions  PLAN FOR NEXT SESSION:  VOR, balance, righting/stepping reactions (esp posteriorly), gait with head turns, could continue blocked practice TUG with emphasis on reaching back for chair to sit as many of her falls have been missing the chair, STG, how did it go with PCP?   Delon DELENA Pop, PT Delon DELENA Pop, PT, DPT, CBIS  10/25/2023, 9:20 AM

## 2023-11-01 ENCOUNTER — Telehealth: Payer: Self-pay

## 2023-11-01 ENCOUNTER — Ambulatory Visit: Attending: Family Medicine

## 2023-11-01 DIAGNOSIS — R42 Dizziness and giddiness: Secondary | ICD-10-CM | POA: Diagnosis not present

## 2023-11-01 DIAGNOSIS — R2681 Unsteadiness on feet: Secondary | ICD-10-CM | POA: Diagnosis not present

## 2023-11-01 NOTE — Therapy (Addendum)
 OUTPATIENT PHYSICAL THERAPY VESTIBULAR TREATMENT     Patient Name: Sarah Miller MRN: [id number] DOB:02-Jun-1945, 78 y.o., female Today's Date: 11/01/2023  END OF SESSION:  PT End of Session - 11/01/23 0810     Visit Number 5    Number of Visits 17    Date for PT Re-Evaluation 11/15/23    Authorization Type [employer] Dual    Progress Note Due on Visit 10    PT Start Time 0807   patient late   PT Stop Time 0848    PT Time Calculation (min) 41 min    Activity Tolerance Patient tolerated treatment well    Behavior During Therapy Impulsive          Past Medical History:  Diagnosis Date   Allergic rhinitis    Anxiety    Aortic atherosclerosis (HCC)    Cancer (HCC)    h/o melanoma '78 and 79   Cervical arthritis    [name], S/P injection with good results.    COPD (chronic obstructive pulmonary disease) (HCC)    with emphysema   Coronary artery calcification seen on CAT scan    Depression    and anxiety   Diabetes mellitus without complication (HCC)    Early menopause    Frozen shoulder    H/O frozen shoulder and rotator cuff tendonititis, L s/p injection   GERD (gastroesophageal reflux disease)    PONV (postoperative nausea and vomiting)    Sleep apnea    Vitamin D deficiency    Past Surgical History:  Procedure Laterality Date   ABDOMINAL HYSTERECTOMY     CATARACT EXTRACTION W/ INTRAOCULAR LENS IMPLANT Bilateral    FRACTURE SURGERY Left    wrist   MELANOMA EXCISION     REVERSE SHOULDER ARTHROPLASTY Left 12/28/2019   Procedure: REVERSE SHOULDER ARTHROPLASTY;  Surgeon: [name], MD;  Location: [facility];  Service: Orthopedics;  Laterality: Left;   Patient Active Problem List   Diagnosis Date Noted   DM (diabetes mellitus) (HCC) 07/26/2023   Atrial fibrillation with RVR (HCC) 07/26/2023   Rapid atrial fibrillation (HCC) 07/25/2023   COPD (chronic obstructive pulmonary disease) (HCC)    Aortic atherosclerosis (HCC)    Coronary artery calcification  seen on CAT scan    DOE (dyspnea on exertion) 02/03/2019   Morbid obesity due to excess calories (HCC) 02/03/2019   Upper airway cough syndrome vs cough variant asthma 04/18/2018   RBBB 03/19/2013    PCP: Montie Pizza, MD REFERRING PROVIDER: Montie Pizza, MD  REFERRING DIAG: R42 (ICD-10-CM) - Dizziness and giddiness   THERAPY DIAG:  Unsteadiness on feet  Dizziness and giddiness  ONSET DATE: 09/13/23 referral   Rationale for Evaluation and Treatment: Rehabilitation  SUBJECTIVE:   SUBJECTIVE STATEMENT: Patient arrives with dtr. Had another fall on Saturday picking a pill up off the floor and fell backwards. Per patient and dtr, has a massive hematoma on her L posterior hip. Her prozac has been increased, finished her abx.  Pt accompanied by: family member  PERTINENT HISTORY: anxiety, CA (melanoma), cervical arthritis, COPD, DM, GERD, sleep apnea  PAIN:  Are you having pain? No  PRECAUTIONS: Fall   PATIENT GOALS: to get stronger and learn balance strategies  OBJECTIVE:  Note: Objective measures were completed at Evaluation unless otherwise noted.  DIAGNOSTIC FINDINGS: 09/04/23 brain MRI IMPRESSION: 1. Age-related atrophy and mild-to-moderate periventricular and deep cerebral white matter disease.   VESTIBULAR ASSESSMENT:  MCTSIB:  -condition 1: 6s, LOB to the L  -  condition 2: 2s, LOB to the L     MOTION SENSITIVITY:  Motion Sensitivity Quotient Intensity: 0 = none, 1 = Lightheaded, 2 = Mild, 3 = Moderate, 4 = Severe, 5 = Vomiting  Intensity  1. Sitting to supine 0  2. Supine to L side 1  3. Supine to R side 0  4. Supine to sitting 1  5. L sidelying 0  6. Up from L  0  7. R sidelying 0  8. Up from R  0  9. Sitting, head tipped to L knee   10. Head up from L knee   11. Sitting, head tipped to R knee   12. Head up from R knee   13. Sitting head turns x5   14.Sitting head nods x5   15. In stance, 180 turn to L    16. In stance, 180 turn to R                                                                                                                                 TREATMENT  Theract: -ABC: 3.125% -MoCA: 19/30 (not a Geneticist, molecular)  -extensive discussion re: cause for LOB posteriorly, impact of cognition  -goal to do HEP at least 3x before next appt    PATIENT EDUCATION: Education details: continue HEP, see above Person educated: Patient and Child(ren) Education method: Explanation, Demonstration, and Handouts Education comprehension: verbalized understanding and needs further education  HOME EXERCISE PROGRAM: Gaze Stabilization: Sitting    Keeping eyes on target on wall 5 feet away, tilt head down 15-30 and move head side to side for __30__ seconds. Repeat while moving head up and down for __30__ seconds. Do _3___ sessions per day. Access Code: [email or web address] URL: [email or web address] Date: 10/11/2023 Prepared by: Delon Pop  Exercises - Sit to Stand with Counter Support  - 1 x daily - 7 x weekly - 3 sets - 5 reps - Forward and Backward Step Over with Counter Support  - 1 x daily - 7 x weekly - 3 sets - 10 reps GOALS: Goals reviewed with patient? Yes  SHORT TERM GOALS: Target date: 10/18/23  Pt will be independent with initial HEP for improved symptom report and balance  Baseline: to be provided; not compliant Goal status: NOT MET  2.  Patient will complete >/=15s of condition 1 of MCTSIB to demonstrate improved balance Baseline: 6s Goal status: REVISED  3.  Patient will improve ABC score to >/= 50% to demonstrate improved confidence in her mobility  Baseline: 26.4%; 3.125% Goal status: NOT MET  4.  Pt will improve gait speed to >/= .43m/s to demonstrate improved community ambulation  Baseline: .25m/s Goal status: REVISED   LONG TERM GOALS: Target date: 11/15/23  Pt will be independent with final HEP for improved symptom report and balance  Baseline: to be provided Goal  status: INITIAL  Patient will complete >/=30s of condition 1 of MCTSIB to demonstrate  improved balance Baseline: 2s Goal status: REVISED  Patient will improve ABC score to >/= 65% to demonstrate improved confidence with mobility  Baseline: 26.4% Goal status: REVISED  Pt will improve gait speed to >/= .38m/s to demonstrate improved community ambulation  Baseline: .31m/s Goal status: REVISED  ASSESSMENT:  CLINICAL IMPRESSION: Patient seen for skilled PT session with emphasis on initiating STG assessment. She has increased in frequency of her falls and they remain rather consistently backwards. She has not been consistent with her HEP. She has not experienced any further dizziness that would account for her falls. A majority of falls, per patient and daughter report, are due to relative impulsivity. MoCA reflective of poor/limited working memory  and Development worker, community. These 2 deficits can significant contribute to ones risk for falls. Continue POC as able.   OBJECTIVE IMPAIRMENTS: Abnormal gait, decreased balance, decreased knowledge of condition, decreased knowledge of use of DME, decreased strength, and dizziness.   ACTIVITY LIMITATIONS: carrying, lifting, stairs, bathing, and locomotion level  PARTICIPATION LIMITATIONS: meal prep, cleaning, laundry, interpersonal relationship, driving, shopping, and community activity  PERSONAL FACTORS: Age, Fitness, Past/current experiences, Sex, Time since onset of injury/illness/exacerbation, and Transportation are also affecting patient's functional outcome.   REHAB POTENTIAL: Good  CLINICAL DECISION MAKING: Evolving/moderate complexity  EVALUATION COMPLEXITY: Moderate   PLAN:  PT FREQUENCY: 1-2x/week  PT DURATION: 8 weeks  PLANNED INTERVENTIONS: 97164- PT Re-evaluation, 97750- Physical Performance Testing, 97110-Therapeutic exercises, 97530- Therapeutic activity, [id number]- Neuromuscular re-education, 97535- Self Care, [id number]- Manual  therapy, [phone]- Gait training, [phone]- Orthotic Initial, [phone]- Orthotic/Prosthetic subsequent, [phone]- Canalith repositioning, [phone]- Aquatic Therapy, Patient/Family education, Balance training, Stair training, Vestibular training, Visual/preceptual remediation/compensation, Cognitive remediation, and DME instructions  PLAN FOR NEXT SESSION:  VOR, balance, righting/stepping reactions (esp posteriorly), gait with head turns, could continue blocked practice TUG with emphasis on reaching back for chair to sit as many of her falls have been missing the chair, STG   Delon DELENA Pop, PT Delon DELENA Pop, PT, DPT, CBIS  11/01/2023, 10:01 AM

## 2023-11-01 NOTE — Telephone Encounter (Signed)
 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  The patient would benefit from ST evaluation for memory/attention.   If you agree, please place an order in [facility] workque in [facility] or fax the order to [phone].  Thank you,  Delon DELENA Pop, PT, DPT, [facility] [address] [place], [place]  [address] Phone:  [phone] Fax:  [phone]

## 2023-11-04 ENCOUNTER — Telehealth: Payer: Self-pay | Admitting: Nurse Practitioner

## 2023-11-04 NOTE — Telephone Encounter (Signed)
 CALLED TO RESCHEDULE APPOINTMENT

## 2023-11-06 DIAGNOSIS — E1169 Type 2 diabetes mellitus with other specified complication: Secondary | ICD-10-CM | POA: Diagnosis not present

## 2023-11-06 DIAGNOSIS — R197 Diarrhea, unspecified: Secondary | ICD-10-CM | POA: Diagnosis not present

## 2023-11-06 DIAGNOSIS — R296 Repeated falls: Secondary | ICD-10-CM | POA: Diagnosis not present

## 2023-11-06 DIAGNOSIS — I959 Hypotension, unspecified: Secondary | ICD-10-CM | POA: Diagnosis not present

## 2023-11-06 DIAGNOSIS — M5030 Other cervical disc degeneration, unspecified cervical region: Secondary | ICD-10-CM | POA: Diagnosis not present

## 2023-11-06 DIAGNOSIS — T148XXA Other injury of unspecified body region, initial encounter: Secondary | ICD-10-CM | POA: Diagnosis not present

## 2023-11-07 ENCOUNTER — Other Ambulatory Visit: Payer: Self-pay | Admitting: Family Medicine

## 2023-11-07 DIAGNOSIS — M5030 Other cervical disc degeneration, unspecified cervical region: Secondary | ICD-10-CM

## 2023-11-08 ENCOUNTER — Ambulatory Visit

## 2023-11-08 ENCOUNTER — Ambulatory Visit: Admitting: Nurse Practitioner

## 2023-11-08 DIAGNOSIS — R42 Dizziness and giddiness: Secondary | ICD-10-CM

## 2023-11-08 DIAGNOSIS — R2681 Unsteadiness on feet: Secondary | ICD-10-CM | POA: Diagnosis not present

## 2023-11-08 NOTE — Therapy (Signed)
 OUTPATIENT PHYSICAL THERAPY VESTIBULAR TREATMENT     Patient Name: Sarah Miller MRN: [id number] DOB:1945-03-04, 78 y.o., female Today's Date: 11/08/2023  END OF SESSION:  PT End of Session - 11/08/23 0818     Visit Number 6    Number of Visits 17    Date for PT Re-Evaluation 11/15/23    Authorization Type [employer] Dual    PT Start Time [phone]   patient late   PT Stop Time 0845    PT Time Calculation (min) 33 min    Equipment Utilized During Treatment Gait belt    Activity Tolerance Patient tolerated treatment well    Behavior During Therapy WFL for tasks assessed/performed          Past Medical History:  Diagnosis Date   Allergic rhinitis    Anxiety    Aortic atherosclerosis (HCC)    Cancer (HCC)    h/o melanoma '78 and 79   Cervical arthritis    [name], S/P injection with good results.    COPD (chronic obstructive pulmonary disease) (HCC)    with emphysema   Coronary artery calcification seen on CAT scan    Depression    and anxiety   Diabetes mellitus without complication (HCC)    Early menopause    Frozen shoulder    H/O frozen shoulder and rotator cuff tendonititis, L s/p injection   GERD (gastroesophageal reflux disease)    PONV (postoperative nausea and vomiting)    Sleep apnea    Vitamin D deficiency    Past Surgical History:  Procedure Laterality Date   ABDOMINAL HYSTERECTOMY     CATARACT EXTRACTION W/ INTRAOCULAR LENS IMPLANT Bilateral    FRACTURE SURGERY Left    wrist   MELANOMA EXCISION     REVERSE SHOULDER ARTHROPLASTY Left 12/28/2019   Procedure: REVERSE SHOULDER ARTHROPLASTY;  Surgeon: [name], MD;  Location: [facility];  Service: Orthopedics;  Laterality: Left;   Patient Active Problem List   Diagnosis Date Noted   DM (diabetes mellitus) (HCC) 07/26/2023   Atrial fibrillation with RVR (HCC) 07/26/2023   Rapid atrial fibrillation (HCC) 07/25/2023   COPD (chronic obstructive pulmonary disease) (HCC)    Aortic atherosclerosis  (HCC)    Coronary artery calcification seen on CAT scan    DOE (dyspnea on exertion) 02/03/2019   Morbid obesity due to excess calories (HCC) 02/03/2019   Upper airway cough syndrome vs cough variant asthma 04/18/2018   RBBB 03/19/2013    PCP: Montie Pizza, MD REFERRING PROVIDER: Montie Pizza, MD  REFERRING DIAG: R42 (ICD-10-CM) - Dizziness and giddiness   THERAPY DIAG:  Unsteadiness on feet  Dizziness and giddiness  ONSET DATE: 09/13/23 referral   Rationale for Evaluation and Treatment: Rehabilitation  SUBJECTIVE:   SUBJECTIVE STATEMENT: Patient arrives late with daughter, using rollator. Had an episode of incontinence as well. PT provided patient with incontinence underwear. Per dtr, she missed a dose of Elliquis yesterday, but took it again this AM. Per dtr, she was unable to walk yesterday. Denies falls.  Pt accompanied by: family member  PERTINENT HISTORY: anxiety, CA (melanoma), cervical arthritis, COPD, DM, GERD, sleep apnea  PAIN:  Are you having pain? No  PRECAUTIONS: Fall   PATIENT GOALS: to get stronger and learn balance strategies  OBJECTIVE:  Note: Objective measures were completed at Evaluation unless otherwise noted.  DIAGNOSTIC FINDINGS: 09/04/23 brain MRI IMPRESSION: 1. Age-related atrophy and mild-to-moderate periventricular and deep cerebral white matter disease.  TREATMENT  Theract: -time spent providing patient with incontinence supplies -in // bars:  -NBOS cone transfer  -more consistent LOB to the L, but able to self-correct  -standing on airex toe tap to gum drops with B UE two-finger support    -unable to progress to static stance with no UE support   PATIENT EDUCATION: Education details: continue HEP Person educated: Patient and Child(ren) Education method: Explanation, Demonstration, and Handouts Education  comprehension: verbalized understanding and needs further education  HOME EXERCISE PROGRAM: Gaze Stabilization: Sitting    Keeping eyes on target on wall 5 feet away, tilt head down 15-30 and move head side to side for __30__ seconds. Repeat while moving head up and down for __30__ seconds. Do _3___ sessions per day. Access Code: [email or web address] URL: [email or web address] Date: 10/11/2023 Prepared by: Delon Pop  Exercises - Sit to Stand with Counter Support  - 1 x daily - 7 x weekly - 3 sets - 5 reps - Forward and Backward Step Over with Counter Support  - 1 x daily - 7 x weekly - 3 sets - 10 reps GOALS: Goals reviewed with patient? Yes  SHORT TERM GOALS: Target date: 10/18/23  Pt will be independent with initial HEP for improved symptom report and balance  Baseline: to be provided; not compliant Goal status: NOT MET  2.  Patient will complete >/=15s of condition 1 of MCTSIB to demonstrate improved balance Baseline: 6s Goal status: REVISED  3.  Patient will improve ABC score to >/= 50% to demonstrate improved confidence in her mobility  Baseline: 26.4%; 3.125% Goal status: NOT MET  4.  Pt will improve gait speed to >/= .37m/s to demonstrate improved community ambulation  Baseline: .66m/s Goal status: REVISED   LONG TERM GOALS: Target date: 11/15/23  Pt will be independent with final HEP for improved symptom report and balance  Baseline: to be provided Goal status: INITIAL  Patient will complete >/=30s of condition 1 of MCTSIB to demonstrate improved balance Baseline: 2s Goal status: REVISED  Patient will improve ABC score to >/= 65% to demonstrate improved confidence with mobility  Baseline: 26.4% Goal status: REVISED  Pt will improve gait speed to >/= .42m/s to demonstrate improved community ambulation  Baseline: .13m/s Goal status: REVISED  ASSESSMENT:  CLINICAL IMPRESSION: Patient seen for skilled PT session with emphasis on progressing  dynamic balance. Session largely limited by patients late arrival and incontinence. While still distractible, she was less impulsive today and able to complete more structured balance tasks. She demonstrates Fair+ standing balance, but struggles most on compliant surfaces. Continue POC.   OBJECTIVE IMPAIRMENTS: Abnormal gait, decreased balance, decreased knowledge of condition, decreased knowledge of use of DME, decreased strength, and dizziness.   ACTIVITY LIMITATIONS: carrying, lifting, stairs, bathing, and locomotion level  PARTICIPATION LIMITATIONS: meal prep, cleaning, laundry, interpersonal relationship, driving, shopping, and community activity  PERSONAL FACTORS: Age, Fitness, Past/current experiences, Sex, Time since onset of injury/illness/exacerbation, and Transportation are also affecting patient's functional outcome.   REHAB POTENTIAL: Good  CLINICAL DECISION MAKING: Evolving/moderate complexity  EVALUATION COMPLEXITY: Moderate   PLAN:  PT FREQUENCY: 1-2x/week  PT DURATION: 8 weeks  PLANNED INTERVENTIONS: 97164- PT Re-evaluation, 97750- Physical Performance Testing, 97110-Therapeutic exercises, 97530- Therapeutic activity, [id number]- Neuromuscular re-education, 97535- Self Care, 02859- Manual therapy, [id number]- Gait training, [phone]- Orthotic Initial, [phone]- Orthotic/Prosthetic subsequent, [phone]- Canalith repositioning, [phone]- Aquatic Therapy, Patient/Family education, Balance training, Stair training, Vestibular training, Visual/preceptual remediation/compensation, Cognitive remediation, and DME instructions  PLAN FOR NEXT SESSION:  VOR, balance on  compliant surfaces,  righting/stepping reactions (esp posteriorly), gait with head turns   Delon DELENA Pop, PT Delon DELENA Pop, PT, DPT, CBIS  11/08/2023, 9:43 AM

## 2023-11-11 ENCOUNTER — Other Ambulatory Visit

## 2023-11-12 ENCOUNTER — Inpatient Hospital Stay
Admission: RE | Admit: 2023-11-12 | Discharge: 2023-11-12 | Discharge status: Home / Self Care | Source: Ambulatory Visit | Attending: Family Medicine | Admitting: Family Medicine

## 2023-11-12 DIAGNOSIS — M4313 Spondylolisthesis, cervicothoracic region: Secondary | ICD-10-CM | POA: Diagnosis not present

## 2023-11-12 DIAGNOSIS — M4802 Spinal stenosis, cervical region: Secondary | ICD-10-CM | POA: Diagnosis not present

## 2023-11-12 DIAGNOSIS — M5030 Other cervical disc degeneration, unspecified cervical region: Secondary | ICD-10-CM

## 2023-11-12 DIAGNOSIS — M47813 Spondylosis without myelopathy or radiculopathy, cervicothoracic region: Secondary | ICD-10-CM | POA: Diagnosis not present

## 2023-11-12 DIAGNOSIS — I959 Hypotension, unspecified: Secondary | ICD-10-CM | POA: Diagnosis not present

## 2023-11-13 ENCOUNTER — Telehealth: Payer: Self-pay | Admitting: Nurse Practitioner

## 2023-11-13 NOTE — Telephone Encounter (Signed)
 PT'S DAUGHTER CALLED TO RESCHEDULE PT APPT.

## 2023-11-15 ENCOUNTER — Encounter: Payer: Self-pay | Admitting: Cardiology

## 2023-11-15 ENCOUNTER — Ambulatory Visit

## 2023-11-15 ENCOUNTER — Inpatient Hospital Stay: Admitting: Nurse Practitioner

## 2023-11-15 ENCOUNTER — Ambulatory Visit: Attending: Cardiology | Admitting: Cardiology

## 2023-11-15 VITALS — BP 100/64 | HR 77 | Ht 61.0 in | Wt 206.0 lb

## 2023-11-15 DIAGNOSIS — I2583 Coronary atherosclerosis due to lipid rich plaque: Secondary | ICD-10-CM | POA: Diagnosis not present

## 2023-11-15 DIAGNOSIS — I5032 Chronic diastolic (congestive) heart failure: Secondary | ICD-10-CM

## 2023-11-15 DIAGNOSIS — E785 Hyperlipidemia, unspecified: Secondary | ICD-10-CM

## 2023-11-15 DIAGNOSIS — I2510 Atherosclerotic heart disease of native coronary artery without angina pectoris: Secondary | ICD-10-CM | POA: Diagnosis not present

## 2023-11-15 DIAGNOSIS — I480 Paroxysmal atrial fibrillation: Secondary | ICD-10-CM

## 2023-11-15 DIAGNOSIS — R2681 Unsteadiness on feet: Secondary | ICD-10-CM

## 2023-11-15 DIAGNOSIS — M170 Bilateral primary osteoarthritis of knee: Secondary | ICD-10-CM | POA: Diagnosis not present

## 2023-11-15 DIAGNOSIS — I272 Pulmonary hypertension, unspecified: Secondary | ICD-10-CM | POA: Diagnosis not present

## 2023-11-15 DIAGNOSIS — R42 Dizziness and giddiness: Secondary | ICD-10-CM

## 2023-11-15 NOTE — Progress Notes (Signed)
 Cardiology Consult Note    Date:  11/15/2023   ID:  Sheng, Pritz 06-Jul-1945, MRN [id number]  PCP:  Teresa Channel, MD  Cardiologist:  Wilbert Bihari, MD   Chief Complaint  Patient presents with   Coronary Artery Disease   Atrial Fibrillation   Hyperlipidemia   Sleep Apnea   Congestive Heart Failure    History of Present Illness:  Sarah Miller is a 78 y.o. female with a hx of depression and anxiety, DM ,early menopause and OSA, COPD, microcytic anemia,  atrial fibrillation and diabetes mellitus 2.  She also has a history of CAD with a coronary CTA done 12/25/2019 showing coronary calcium score of 60 which was 72nd percentile for age and sex matched controls along with 50 to 70% proximal RCA that was nondominant, less than 25% mid LAD as well as mid left circumflex.  She also was found at that time to have abnormal pulmonary vein drainage into the left atrium with left common pulmonary vein.  She was recently admitted to the ER on 07/25/2023 with a 2-day history of shortness of breath, extreme fatigue and dizziness.  She also had noted some fluttering in her chest.  She was found to be in A-fib with RVR on presentation.  She was placed on amiodarone  and apixaban .  2D echo was done which showed an EF of 60 to 65% with G1 DD, mild RV dysfunction, moderate pulmonary hypertension PASP 55 mmHg and moderate TR.  She was discharged to home on amiodarone  400 mg twice daily x 2 weeks and 400 mg daily x 2 weeks then 200 mg maintenance.  She was also placed on Eliquis  5 mg twice daily for CHA2DS2-VASc score of 6.    During that hospital stay she had some shortness of breath along with an elevated BNP but no edema on chest x-ray.  She did have some mild pedal edema and so she was started on Lasix  20 mg daily for 3 days post discharge and then as needed for lower extremity edema and weight gain of greater than 3 pounds in a day.  Her troponin was minimally elevated with flat trend at 42>> 38 and  felt to be related to demand ischemia in the setting of A-fib with RVR and volume overload.  Since 2D echo was normal no further cardiac workup was recommended at that time.  She was seen back by Josefa Beauvais on 08/12/2023 and was back in normal sinus rhythm.  She had no bleeding problems on Eliquis .  Her main problem at that time was increased shortness of breath and activity intolerance.  Her Amio was ultimately weaned off and has continued on apixaban .  She was referred to EP for consideration of Watchman device due to increased falls.  She also has a history of obstructive sleep apnea and is on CPAP therapy.  She has chronic COPD. She does not follow with a sleep MD at present and has been followed by [facility] but that MD is no longer there. Her DME is [employer].  She is waiting for a new mask.   She is now back for follow-up and is doing well.  She denies any chest pain or pressure, PND, orthopnea, SOB, DOE, lower extremity edema, dizziness, syncope, palpitations.  Past Medical History:  Diagnosis Date   Allergic rhinitis    Anxiety    Aortic atherosclerosis (HCC)    CAD (coronary artery disease), native coronary artery    coronary CTA done 12/25/2019 showing  coronary calcium score of 60 which was 72nd percentile for age and sex matched controls along with 50 to 70% proximal RCA that was nondominant, less than 25% mid LAD as well as mid left circumflex.   Cancer [facility])    h/o melanoma '78 and 79   Cervical arthritis    [name], S/P injection with good results.    Chronic diastolic CHF (congestive heart failure) (HCC)    COPD (chronic obstructive pulmonary disease) (HCC)    with emphysema   Depression    and anxiety   Diabetes mellitus without complication (HCC)    Early menopause    Frozen shoulder    H/O frozen shoulder and rotator cuff tendonititis, L s/p injection   GERD (gastroesophageal reflux disease)    PAF (paroxysmal atrial fibrillation) (HCC)    PONV  (postoperative nausea and vomiting)    Pulmonary hypertension (HCC)    Sleep apnea    Vitamin D deficiency     Past Surgical History:  Procedure Laterality Date   ABDOMINAL HYSTERECTOMY     CATARACT EXTRACTION W/ INTRAOCULAR LENS IMPLANT Bilateral    FRACTURE SURGERY Left    wrist   MELANOMA EXCISION     REVERSE SHOULDER ARTHROPLASTY Left 12/28/2019   Procedure: REVERSE SHOULDER ARTHROPLASTY;  Surgeon: [name], MD;  Location: [facility];  Service: Orthopedics;  Laterality: Left;    Current Medications: Current Meds  Medication Sig   albuterol  (VENTOLIN  HFA) 108 (90 Base) MCG/ACT inhaler Inhale 1-2 puffs into the lungs every 6 (six) hours as needed for wheezing or shortness of breath.   apixaban  (ELIQUIS ) 5 MG TABS tablet Take 1 tablet (5 mg total) by mouth 2 (two) times daily.   atorvastatin (LIPITOR) 10 MG tablet Take 10 mg by mouth daily.   buPROPion  (WELLBUTRIN  XL) 300 MG 24 hr tablet Take 300 mg by mouth daily.    FLUoxetine (PROZAC) 10 MG capsule Take 30 mg by mouth daily.   furosemide  (LASIX ) 20 MG tablet Take 1 tablet (20 mg total) by mouth daily as needed for fluid or edema.   Insulin  Glargine (BASAGLAR  KWIKPEN) 100 UNIT/ML Inject 20 Units into the skin at bedtime.   metFORMIN (GLUCOPHAGE-XR) 500 MG 24 hr tablet Take 500 mg by mouth in the morning and at bedtime. 500 mg in morning and 500 mg at night   montelukast  (SINGULAIR ) 10 MG tablet TAKE 1 TABLET BY MOUTH EVERYDAY AT BEDTIME   MOUNJARO 7.5 MG/0.5ML Pen Inject 7.5 mg into the skin once a week.   ondansetron  (ZOFRAN ) 4 MG tablet Take 4 mg by mouth every 8 (eight) hours as needed for nausea or vomiting.   [DISCONTINUED] amiodarone  (PACERONE ) 200 MG tablet Take 2 tablets (400 mg total) 2 (two) times daily for 14 days, THEN 2 tablets (400 mg total) daily for 14 days, THEN 1 tablet (200 mg total) daily for 16 days.   [DISCONTINUED] amiodarone  (PACERONE ) 200 MG tablet Take 200 mg by mouth daily.   [DISCONTINUED] methocarbamol   (ROBAXIN ) 500 MG tablet Take 1 tablet (500 mg total) by mouth 2 (two) times daily.   [DISCONTINUED] MOUNJARO 2.5 MG/0.5ML Pen Inject 5 mg into the skin once a week.    Allergies:   Morphine and codeine, Other, and Penicillins   Social History   Socioeconomic History   Marital status: Divorced    Spouse name: Not on file   Number of children: Not on file   Years of education: Not on file   Highest  education level: Not on file  Occupational History   Not on file  Tobacco Use   Smoking status: Former    Current packs/day: 0.00    Average packs/day: 0.8 packs/day for 53.0 years (39.8 ttl pk-yrs)    Types: Cigarettes    Start date: 02/27/1963    Quit date: 02/27/2016    Years since quitting: 7.7   Smokeless tobacco: Never  Vaping Use   Vaping status: Never Used  Substance and Sexual Activity   Alcohol use: Yes    Comment: rare   Drug use: No   Sexual activity: Not on file    Comment: Hysterectomy  Other Topics Concern   Not on file  Social History Narrative   Not on file   Social Drivers of Health   Financial Resource Strain: Not on file  Food Insecurity: No Food Insecurity (07/25/2023)   Hunger Vital Sign    Worried About Running Out of Food in the Last Year: Never true    Ran Out of Food in the Last Year: Never true  Transportation Needs: No Transportation Needs (07/25/2023)   PRAPARE - Administrator, Civil Service (Medical): No    Lack of Transportation (Non-Medical): No  Physical Activity: Not on file  Stress: Not on file  Social Connections: Socially Isolated (07/25/2023)   Social Connection and Isolation Panel    Frequency of Communication with Friends and Family: More than three times a week    Frequency of Social Gatherings with Friends and Family: Three times a week    Attends Religious Services: Never    Active Member of Clubs or Organizations: No    Attends Engineer, structural: Never    Marital Status: Divorced     Family History:  The  patient's family history includes CAD in her brother, father, and paternal grandfather; Cancer in her father, mother, and another family member; Diabetes in her father; Heart attack in her father; Heart attack (age of onset: 47) in her brother; Heart disease in her brother, father, and another family member; Hyperlipidemia in her father and another family member; Hypertension in her father and sister.   ROS:   Please see the history of present illness.    ROS All other systems reviewed and are negative.      No data to display             PHYSICAL EXAM:   VS:  BP 100/64   Pulse 77   Ht 5' 1 (1.549 m)   Wt 206 lb (93.4 kg)   LMP  (LMP Unknown)   SpO2 95%   BMI 38.92 kg/m    GEN: Well nourished, well developed in no acute distress HEENT: Normal NECK: No JVD; No carotid bruits LYMPHATICS: No lymphadenopathy CARDIAC:RRR, no murmurs, rubs, gallops RESPIRATORY:  Clear to auscultation without rales, wheezing or rhonchi  ABDOMEN: Soft, non-tender, non-distended MUSCULOSKELETAL:  No edema; No deformity  SKIN: Warm and dry NEUROLOGIC:  Alert and oriented x 3 PSYCHIATRIC:  Normal affect  Wt Readings from Last 3 Encounters:  11/15/23 206 lb (93.4 kg)  08/27/23 227 lb 11.2 oz (103.3 kg)  08/26/23 227 lb 11.2 oz (103.3 kg)      Studies/Labs Reviewed:    Recent Labs: 07/25/2023: Pro Brain Natriuretic Peptide 2,279.0 07/26/2023: Magnesium 2.1; TSH 1.307 08/27/2023: ALT 19; BUN 32; Creatinine, Ser 1.10; Hemoglobin 14.3; Platelets 392; Potassium 4.0; Sodium 141   Lipid Panel    Component Value Date/Time   CHOL  126 07/27/2023 0557   TRIG 168 (H) 07/27/2023 0557   HDL 38 (L) 07/27/2023 0557   CHOLHDL 3.3 07/27/2023 0557   VLDL 34 07/27/2023 0557   LDLCALC 54 07/27/2023 0557    Additional studies/ records that were reviewed today include:  OV notes from PCP, EKG, chest CT    ASSESSMENT:    1. Coronary artery disease involving native coronary artery of native heart without  angina pectoris   2. Hyperlipidemia LDL goal <70   3. PAF (paroxysmal atrial fibrillation) (HCC)   4. Chronic diastolic CHF (congestive heart failure) (HCC)   5. Pulmonary hypertension (HCC)   6. Coronary artery disease due to lipid rich plaque       PLAN:  In order of problems listed above:  ASCAD -coronary CTA done 12/25/2019 showing coronary calcium score of 60 which was 72nd percentile for age and sex matched controls along with 50 to 70% proximal RCA that was nondominant, less than 25% mid LAD as well as mid left circumflex -her SOB resolved after she got back into NSR -she had minimally elevated trop with flat trend c/w demand ischemia in the setting of afib with normal LVF by echo so no further ischemic workup was recommended. -she has no CP -Continue atorvastatin 10 mg daily -no ASA due to DOAC  HLD -LDL goal < 70 -Lipids: 07/27/2023 LDL 54, HDL 38, triglycerides 168 -Continue atorvastatin 10 mg daily with as needed refills  Paroxysmal atrial fibrillation - Presented 07/16/2023 with A-fib with RVR - Started amiodarone  and currently on 200 mg daily along with apixaban  5 mg twice daily   - CHADS2VASC score  6 - Denies any bleeding problems on DOAC but has been having frequent falls and is now getting a neuro workup and is in neuro PT>>she is at very high risk of fall and subsequent intracranial bleed - Denies any recent palpitations - Maintaining normal sinus rhythm on exam today - Continue apixaban  5 mg twice daily for now and refer to EP for consideration of the Watchman  -[facility] Referral for Left Atrial Appendage Closure with Non-Valvular Atrial Fibrillation   Sarah Miller is a 78 y.o. female is being referred to the [facility] Team for evaluation for Left Atrial Appendage Closure with Watchman device for the management of stroke risk resulting form non-valvular atrial fibrillation.    Base upon Sarah Miller's history, she is felt to be a  poor candidate for long-term anticoagulation because of a high risk of recurrent falls.  The patient has a HAS-BLED score of 2 indicating a Yearly Major Bleeding Risk of 1.88%.      Her CHADS2-VASc Score is 7 with an unadjusted Ischemic Stroke Rate (% per year) of 11.2%.    Her stroke risk necessitates a strategy of stroke prevention with either long-term oral anticoagulation or left atrial appendage occlusion therapy. We have discussed their bleeding risk in the context of their comorbid medical problems, as well as the rationale for referral for evaluation of Watchman left atrial appendage occlusion therapy. While the patient is at high long-term bleeding risk, they may be appropriate for short-term anticoagulation. Based on this individual patient's stroke and bleeding risk, a shared decision has been made to refer the patient for consideration of Watchman left atrial appendage closure utilizing the [employer] shared decision tool.  OSA -Continue CPAP for her OSA -no issues with her CPAP except for waiting for a new mask -I will get a  download in 4 weeks  Chronic diastolic CHF - 2D echo in May 2025 with EF 60 to 65% with G1 DD - Was noted to have an elevated BNP with volume overloaded during her admission for A-fib with RVR in May 2025 treated with Lasix  - She appears euvolemic on exam today - Continue Lasix  20 mg daily as needed for weight gain greater than 3 pounds in a day or 5 pounds in a week as well as lower extremity edema>she has only had to take once  COPD OSA -Followed by pulmonary     Medication Adjustments/Labs and Tests Ordered: Current medicines are reviewed at length with the patient today.  Concerns regarding medicines are outlined above.  Medication changes, Labs and Tests ordered today are listed in the Patient Instructions below.  There are no Patient Instructions on file for this visit.   Signed, Wilbert Bihari, MD  11/15/2023 4:36  PM    [facility] [address] [place], [place], [place]  [address] Phone: [phone]; Fax: [phone]

## 2023-11-15 NOTE — Therapy (Signed)
 OUTPATIENT PHYSICAL THERAPY VESTIBULAR TREATMENT     Patient Name: Sarah Miller MRN: [id number] DOB:1946-02-22, 78 y.o., female Today's Date: 11/15/2023  END OF SESSION:  PT End of Session - 11/15/23 0812     Visit Number 7    Number of Visits 17    Date for Recertification  11/15/23    Authorization Type UHC Dual    Progress Note Due on Visit 10    PT Start Time 0809   patient late   PT Stop Time 0850    PT Time Calculation (min) 41 min    Activity Tolerance Patient tolerated treatment well    Behavior During Therapy [facility] for tasks assessed/performed;Impulsive          Past Medical History:  Diagnosis Date   Allergic rhinitis    Anxiety    Aortic atherosclerosis (HCC)    Cancer (HCC)    h/o melanoma '78 and 79   Cervical arthritis    [name], S/P injection with good results.    COPD (chronic obstructive pulmonary disease) (HCC)    with emphysema   Coronary artery calcification seen on CAT scan    Depression    and anxiety   Diabetes mellitus without complication (HCC)    Early menopause    Frozen shoulder    H/O frozen shoulder and rotator cuff tendonititis, L s/p injection   GERD (gastroesophageal reflux disease)    PONV (postoperative nausea and vomiting)    Sleep apnea    Vitamin D deficiency    Past Surgical History:  Procedure Laterality Date   ABDOMINAL HYSTERECTOMY     CATARACT EXTRACTION W/ INTRAOCULAR LENS IMPLANT Bilateral    FRACTURE SURGERY Left    wrist   MELANOMA EXCISION     REVERSE SHOULDER ARTHROPLASTY Left 12/28/2019   Procedure: REVERSE SHOULDER ARTHROPLASTY;  Surgeon: [name], MD;  Location: [facility];  Service: Orthopedics;  Laterality: Left;   Patient Active Problem List   Diagnosis Date Noted   DM (diabetes mellitus) (HCC) 07/26/2023   Atrial fibrillation with RVR (HCC) 07/26/2023   Rapid atrial fibrillation (HCC) 07/25/2023   COPD (chronic obstructive pulmonary disease) (HCC)    Aortic atherosclerosis (HCC)     Coronary artery calcification seen on CAT scan    DOE (dyspnea on exertion) 02/03/2019   Morbid obesity due to excess calories (HCC) 02/03/2019   Upper airway cough syndrome vs cough variant asthma 04/18/2018   RBBB 03/19/2013    PCP: Montie Pizza, MD REFERRING PROVIDER: Montie Pizza, MD  REFERRING DIAG: R42 (ICD-10-CM) - Dizziness and giddiness   THERAPY DIAG:  Unsteadiness on feet  Dizziness and giddiness  ONSET DATE: 09/13/23 referral   Rationale for Evaluation and Treatment: Rehabilitation  SUBJECTIVE:   SUBJECTIVE STATEMENT: Patient arrives late with dtr and rollator. Did have a fall off the toilet reaching forward toward the bar and landed on the floor. Had to call 911 again for assist off the floor.  Pt accompanied by: family member  PERTINENT HISTORY: anxiety, CA (melanoma), cervical arthritis, COPD, DM, GERD, sleep apnea  PAIN:  Are you having pain? No  PRECAUTIONS: Fall   PATIENT GOALS: to get stronger and learn balance strategies  OBJECTIVE:  Note: Objective measures were completed at Evaluation unless otherwise noted.  DIAGNOSTIC FINDINGS: 09/04/23 brain MRI IMPRESSION: 1. Age-related atrophy and mild-to-moderate periventricular and deep cerebral white matter disease.  11/12/23 c-spine MRI  IMPRESSION: 1. Multilevel cervical spondylosis with resultant mild spinal stenosis at C3-4 and  C4-5. No high-grade spinal stenosis or cord impingement. 2. Multifactorial degenerative changes with resultant severe left C4 foraminal stenosis. No other significant foraminal encroachment within the cervical spine. 3. Mild chronic compression deformities involving the superior endplates of T1 and T2 without retropulsion.                                                                                                                            TREATMENT  Theract: -time spent reviewing possible etiology of recurrent falls  -extensive time spent reviewing set up of  bathroom and safety within bathroom given numerous recent falls there  -PT encouraging using of tub transfer bench  -able to simulate bathroom set up    PATIENT EDUCATION: Education details: continue HEP, see above Person educated: Patient and Child(ren) Education method: Explanation, Demonstration, and Handouts Education comprehension: verbalized understanding and needs further education  HOME EXERCISE PROGRAM: Gaze Stabilization: Sitting    Keeping eyes on target on wall 5 feet away, tilt head down 15-30 and move head side to side for __30__ seconds. Repeat while moving head up and down for __30__ seconds. Do _3___ sessions per day. Access Code: [email or web address] URL: [email or web address] Date: 10/11/2023 Prepared by: Delon Pop  Exercises - Sit to Stand with Counter Support  - 1 x daily - 7 x weekly - 3 sets - 5 reps - Forward and Backward Step Over with Counter Support  - 1 x daily - 7 x weekly - 3 sets - 10 reps GOALS: Goals reviewed with patient? Yes  SHORT TERM GOALS: Target date: 10/18/23  Pt will be independent with initial HEP for improved symptom report and balance  Baseline: to be provided; not compliant Goal status: NOT MET  2.  Patient will complete >/=15s of condition 1 of MCTSIB to demonstrate improved balance Baseline: 6s Goal status: REVISED  3.  Patient will improve ABC score to >/= 50% to demonstrate improved confidence in her mobility  Baseline: 26.4%; 3.125% Goal status: NOT MET  4.  Pt will improve gait speed to >/= .25m/s to demonstrate improved community ambulation  Baseline: .9m/s Goal status: REVISED   LONG TERM GOALS: Target date: 11/15/23  Pt will be independent with final HEP for improved symptom report and balance  Baseline: to be provided Goal status: INITIAL  Patient will complete >/=30s of condition 1 of MCTSIB to demonstrate improved balance Baseline: 2s Goal status: REVISED  Patient will improve ABC score to >/=  65% to demonstrate improved confidence with mobility  Baseline: 26.4% Goal status: REVISED  Pt will improve gait speed to >/= .17m/s to demonstrate improved community ambulation  Baseline: .43m/s Goal status: REVISED  ASSESSMENT:  CLINICAL IMPRESSION: Patient seen for skilled PT session with emphasis on patient and family education regarding bathroom safety. Currently using a temporarily installed toilet handle that has shifted and resulted in a fall. Patient able to demonstrate tub transfer bench use in simulated bathroom and RW instead of rollator with verbal cues  and SBA. Patient and dtr endorse anxiety surrounding upcoming neuro appt. Continue POC.   OBJECTIVE IMPAIRMENTS: Abnormal gait, decreased balance, decreased knowledge of condition, decreased knowledge of use of DME, decreased strength, and dizziness.   ACTIVITY LIMITATIONS: carrying, lifting, stairs, bathing, and locomotion level  PARTICIPATION LIMITATIONS: meal prep, cleaning, laundry, interpersonal relationship, driving, shopping, and community activity  PERSONAL FACTORS: Age, Fitness, Past/current experiences, Sex, Time since onset of injury/illness/exacerbation, and Transportation are also affecting patient's functional outcome.   REHAB POTENTIAL: Good  CLINICAL DECISION MAKING: Evolving/moderate complexity  EVALUATION COMPLEXITY: Moderate   PLAN:  PT FREQUENCY: 1-2x/week  PT DURATION: 8 weeks  PLANNED INTERVENTIONS: 97164- PT Re-evaluation, 97750- Physical Performance Testing, 97110-Therapeutic exercises, 97530- Therapeutic activity, [id number]- Neuromuscular re-education, 97535- Self Care, [id number]- Manual therapy, [phone]- Gait training, [phone]- Orthotic Initial, [phone]- Orthotic/Prosthetic subsequent, [phone]- Canalith repositioning, [phone]- Aquatic Therapy, Patient/Family education, Balance training, Stair training, Vestibular training, Visual/preceptual remediation/compensation, Cognitive remediation, and DME  instructions  PLAN FOR NEXT SESSION:  VOR, balance on compliant surfaces,  righting/stepping reactions (esp posteriorly), gait with head turns  WILL NEED RE-CERT AND GOAL CHECK    Delon DELENA Pop, PT Delon DELENA Pop, PT, DPT, CBIS  11/15/2023, 10:00 AM

## 2023-11-15 NOTE — Patient Instructions (Signed)
 Medication Instructions:  Your physician recommends that you continue on your current medications as directed. Please refer to the Current Medication list given to you today.  *If you need a refill on your cardiac medications before your next appointment, please call your pharmacy*  Lab Work: None.  If you have labs (blood work) drawn today and your tests are completely normal, you will receive your results only by: MyChart Message (if you have MyChart) OR A paper copy in the mail If you have any lab test that is abnormal or we need to change your treatment, we will call you to review the results.  Testing/Procedures: None.  Follow-Up: At [facility], you and your health needs are our priority.  As part of our continuing mission to provide you with exceptional heart care, our providers are all part of one team.  This team includes your primary Cardiologist (physician) and Advanced Practice Providers or APPs (Physician Assistants and Nurse Practitioners) who all work together to provide you with the care you need, when you need it.  Your next appointment:   3 month(s)  Provider:   Wilbert Bihari, MD    We recommend signing up for the patient portal called MyChart.  Sign up information is provided on this After Visit Summary.  MyChart is used to connect with patients for Virtual Visits (Telemedicine).  Patients are able to view lab/test results, encounter notes, upcoming appointments, etc.  Non-urgent messages can be sent to your provider as well.   To learn more about what you can do with MyChart, go to [email or web address].   Other Instructions Dr. Bihari has referred you to our electrophysiology department for evaluation for a Watchman procedure. Someone will call you to set up an appointment.

## 2023-11-15 NOTE — Addendum Note (Signed)
 Addended by: JANIT GENI CROME on: 11/15/2023 04:46 PM   Modules accepted: Orders

## 2023-11-18 ENCOUNTER — Ambulatory Visit: Admitting: Neurology

## 2023-11-18 ENCOUNTER — Encounter: Payer: Self-pay | Admitting: Neurology

## 2023-11-18 VITALS — BP 118/72 | HR 74 | Ht 61.0 in | Wt 208.0 lb

## 2023-11-18 DIAGNOSIS — R4189 Other symptoms and signs involving cognitive functions and awareness: Secondary | ICD-10-CM

## 2023-11-18 DIAGNOSIS — R296 Repeated falls: Secondary | ICD-10-CM

## 2023-11-18 NOTE — Progress Notes (Addendum)
 NEUROLOGY CONSULTATION NOTE  Sarah Miller MRN: [id number] DOB: [birth year]  Referring provider: Worth Moloney, PA Primary care provider: Dr. Montie Pizza  Reason for consult:  dizziness, frequent falls   Thank you for your kind referral of Sarah Miller for consultation of the above symptoms. Although her history is well known to you, please allow me to reiterate it for the purpose of our medical record. The patient was accompanied to the clinic by her daugher Sarah Miller who also provides collateral information. Records and images were personally reviewed where available.   HISTORY OF PRESENT ILLNESS: This is a 78 year old right-handed woman with a history DM2, OSA on CPAP, atrial fibrillation on anticoagulation, CAD, COPD, depression, anxiety, presenting for evaluation of dizziness. Her daughter Sarah Miller supplements the history. She was referred in July 2025 for dizziness and frequent falls. She reported dizziness worse when looking to the left. I personally reviewed MRI brain with and without contrast done 08/2023 which did not show any acute changes, there was mild to moderate chronic microvascular disease. Ventricles appear slightly enlarged with note of diffuse cerebral and cerebellar volume loss. On initial vestibular therapy evaluation in 08/2023, all positional testing was negative however her VOR and HIT were both impaired indicative of a vestibular hypofunction, L>R. They report that after atrial fibrillation was treated, the dizziness resolved. Main concern today is the progressive gait dysfunction despite improvement in dizziness. She has had a significant amount of falls. Sarah Miller reports balance changes started around a year ago, worsening in the Spring/Summer of 2025, significantly worse in June where she had several falls. Sarah Miller recalls that 3-4 months ago she was walking independently but not well, however now she cannot even get out of bed without assistance. She is off balance,  she falls backward or to the side. Sarah Miller describes her movements as like a baby and seemingly worse on the left side.  She has a zombie walk and does not seem to engage her core. She has to hold on to the shower bar as her daughter washes her. If she focuses on it, she can do better with walking however she needs a 1-person assist, unable to use a walker because she would fall backward. She has a prescription for a wheelchair now. She denies any numbness/tingling/weakness, no neck or back pain. She had an MRI cervical spine without contrast earlier this month with no myelopathy seen, there was multilevel cervical spondylosis with mild spinal canal stenosis at C3-4 and C4-5, severe left C4 foraminal stenosis, mild chronic compression deformities involving the superior endplates of T1 and T2 without retropulsion. On her most recent PT session 9/19, they reported a fall off the toilet as she was reaching for the bar and landed on the floor. She was noted to have abnormal gait, decreased balance, decreased knowledge of condition, decreased knowledge of use of DME, decreased strength, and dizziness.   Cognitive changes also started in Spring/Summer 2025. She had been living alone independently up until her admission for atrial fibrillation in June 2025. Sarah Miller started noticing she would forget her medications, Sarah Miller would text and she would report she forgot them so she took over medications in June. Sarah Miller had to move in with her at that time and noticed progressive cognitive decline. She repeats herself, Sarah Miller has told her 15 times already where they were going today. She lost her email, forgets her passwords, she can't type anything. She calls things different names, she was supposed to prick her finger to check  blood then went on to use the wrong medication. Sarah Miller reports focus is a big thing, there is no focus. She is also less inhibited with family, no filter in the past year. She is  impulsive. She continues to do well managing finances, most are on autopay, Sarah Miller checks behind her. She stopped driving locally within the past year, she denied getting lost, it was getting more difficult to get down to the stairs to her car. There is no family history of cognitive changes or similar gait difficulties. No history of head injuries. She started to have urinary incontinence over a year ago, she recently started wearing Depends. She has interrupted sleep due to urination, getting up to use the bathroom however still needing to change the sheets daily. Sarah Miller repots she lays down or sits most of the day, on her phone or TV. She has been unable to do PT home exercises due to their fear of falling.  She denies any headaches. She had monocular vertical diplopia on the left eye that improved with glasses. No dysarthria/dysphagia, anosmia, visual hallucinations. She has had a mild tremor in both hands, she feels the tremor on the left hand started after left shoulder injury in 2021. She previously worked in Clinical biochemist and thinks she had PTSD from that but also from the recurrent falls.   Laboratory Data: Lab Results  Component Value Date   TSH 1.307 07/26/2023   Lab Results  Component Value Date   VITAMINB12 312 07/12/2021     PAST MEDICAL HISTORY: Past Medical History:  Diagnosis Date   Allergic rhinitis    Anxiety    Aortic atherosclerosis (HCC)    CAD (coronary artery disease), native coronary artery    coronary CTA done 12/25/2019 showing coronary calcium score of 60 which was 72nd percentile for age and sex matched controls along with 50 to 70% proximal RCA that was nondominant, less than 25% mid LAD as well as mid left circumflex.   Cancer [facility])    h/o melanoma '78 and 79   Cervical arthritis    [name], S/P injection with good results.    Chronic diastolic CHF (congestive heart failure) (HCC)    COPD (chronic obstructive pulmonary disease) (HCC)     with emphysema   Depression    and anxiety   Diabetes mellitus without complication (HCC)    Early menopause    Frozen shoulder    H/O frozen shoulder and rotator cuff tendonititis, L s/p injection   GERD (gastroesophageal reflux disease)    PAF (paroxysmal atrial fibrillation) (HCC)    PONV (postoperative nausea and vomiting)    Pulmonary hypertension (HCC)    Sleep apnea    Vitamin D deficiency     PAST SURGICAL HISTORY: Past Surgical History:  Procedure Laterality Date   ABDOMINAL HYSTERECTOMY     CATARACT EXTRACTION W/ INTRAOCULAR LENS IMPLANT Bilateral    FRACTURE SURGERY Left    wrist   MELANOMA EXCISION     REVERSE SHOULDER ARTHROPLASTY Left 12/28/2019   Procedure: REVERSE SHOULDER ARTHROPLASTY;  Surgeon: [name], MD;  Location: [facility];  Service: Orthopedics;  Laterality: Left;    MEDICATIONS: Current Outpatient Medications on File Prior to Visit  Medication Sig Dispense Refill   albuterol  (VENTOLIN  HFA) 108 (90 Base) MCG/ACT inhaler Inhale 1-2 puffs into the lungs every 6 (six) hours as needed for wheezing or shortness of breath.     apixaban  (ELIQUIS ) 5 MG TABS tablet Take 1 tablet (5 mg  total) by mouth 2 (two) times daily. 180 tablet 1   atorvastatin (LIPITOR) 10 MG tablet Take 10 mg by mouth daily.  3   buPROPion  (WELLBUTRIN  XL) 300 MG 24 hr tablet Take 300 mg by mouth daily.      FLUoxetine (PROZAC) 10 MG capsule Take 30 mg by mouth daily.     fluticasone  (FLONASE ) 50 MCG/ACT nasal spray SPRAY 2 SPRAYS INTO EACH NOSTRIL EVERY DAY 48 mL 1   Insulin  Glargine (BASAGLAR  KWIKPEN) 100 UNIT/ML Inject 20 Units into the skin at bedtime.     metFORMIN (GLUCOPHAGE-XR) 500 MG 24 hr tablet Take 500 mg by mouth in the morning and at bedtime. 500 mg in morning and 500 mg at night  1   montelukast  (SINGULAIR ) 10 MG tablet TAKE 1 TABLET BY MOUTH EVERYDAY AT BEDTIME 90 tablet 0   MOUNJARO 7.5 MG/0.5ML Pen Inject 7.5 mg into the skin once a week.     ondansetron  (ZOFRAN ) 4 MG  tablet Take 4 mg by mouth every 8 (eight) hours as needed for nausea or vomiting.     ONETOUCH ULTRA TEST test strip SMARTSIG:50 Daily     Vitamin D, Ergocalciferol, (DRISDOL) 1.25 MG (50000 UNIT) CAPS capsule Take 50,000 Units by mouth every 7 (seven) days.     furosemide  (LASIX ) 20 MG tablet Take 1 tablet (20 mg total) by mouth daily as needed for fluid or edema. (Patient not taking: Reported on 11/18/2023) 30 tablet 0   meclizine (ANTIVERT) 25 MG tablet Take 25 mg by mouth every 8 (eight) hours as needed. (Patient not taking: Reported on 11/18/2023)     No current facility-administered medications on file prior to visit.    ALLERGIES: Allergies  Allergen Reactions   Morphine And Codeine Nausea Only   Other     hydocan cough syrup causes nausea   Penicillins Rash    Tolerated Cephalosporin 12/28/2019.     FAMILY HISTORY: Family History  Problem Relation Age of Onset   Cancer Mother        ovarian cancer   Hypertension Father    Diabetes Father    Heart attack Father    Cancer Father        testicular   CAD Father    Heart disease Father    Hyperlipidemia Father    Heart attack Brother 66   CAD Brother    Heart disease Brother    CAD Paternal Grandfather    Hypertension Sister    Cancer Other    Hyperlipidemia Other    Heart disease Other     SOCIAL HISTORY: Social History   Socioeconomic History   Marital status: Divorced    Spouse name: Not on file   Number of children: Not on file   Years of education: Not on file   Highest education level: Not on file  Occupational History   Not on file  Tobacco Use   Smoking status: Former    Current packs/day: 0.00    Average packs/day: 0.8 packs/day for 53.0 years (39.8 ttl pk-yrs)    Types: Cigarettes    Start date: 02/27/1963    Quit date: 02/27/2016    Years since quitting: 7.7   Smokeless tobacco: Never  Vaping Use   Vaping status: Never Used  Substance and Sexual Activity   Alcohol use: Not Currently     Comment: rare   Drug use: No   Sexual activity: Not on file    Comment: Hysterectomy  Other Topics Concern  Not on file  Social History Narrative   Living w/ daughter, with cat-1 story, 1 step   Right hand   Caffeine 1 cup coffee daily      Social Drivers of Corporate investment banker Strain: Not on file  Food Insecurity: No Food Insecurity (07/25/2023)   Hunger Vital Sign    Worried About Running Out of Food in the Last Year: Never true    Ran Out of Food in the Last Year: Never true  Transportation Needs: No Transportation Needs (07/25/2023)   PRAPARE - Administrator, Civil Service (Medical): No    Lack of Transportation (Non-Medical): No  Physical Activity: Not on file  Stress: Not on file  Social Connections: Socially Isolated (07/25/2023)   Social Connection and Isolation Panel    Frequency of Communication with Friends and Family: More than three times a week    Frequency of Social Gatherings with Friends and Family: Three times a week    Attends Religious Services: Never    Active Member of Clubs or Organizations: No    Attends Banker Meetings: Never    Marital Status: Divorced  Catering manager Violence: Not At Risk (07/25/2023)   Humiliation, Afraid, Rape, and Kick questionnaire    Fear of Current or Ex-Partner: No    Emotionally Abused: No    Physically Abused: No    Sexually Abused: No     PHYSICAL EXAM: Vitals:   11/18/23 1026  BP: 118/72  Pulse: 74  SpO2: 97%   General: No acute distress Head:  Normocephalic/atraumatic Skin/Extremities: No rash, no edema Neurological Exam: Mental status: alert and awake, no dysarthria or aphasia, Fund of knowledge is appropriate. Attention and concentration are normal.     Cranial nerves: CN I: not tested CN II: pupils equal, round, visual fields intact CN III, IV, VI:  full range of motion, no nystagmus, no ptosis CN V: facial sensation intact CN VII: upper and lower face symmetric CN  VIII: hearing intact to conversation CN XI: sternocleidomastoid and trapezius muscles intact CN XII: tongue midline Bulk & Tone: normal, no fasciculations, slight cogwheeling on right, none on left Motor: 5/5 throughout with no pronator drift. Sensation: intact to light touch, cold, pin, vibration sense.  No extinction to double simultaneous stimulation.  Romberg test negative Deep Tendon Reflexes: brisk +2 throughout except for absent ankle jerks, no ankle clonus Plantar responses: downgoing bilaterally Cerebellar: no incoordination on finger to nose testing, normal RAMs Gait: needs 2-person assist to stand and walk a few steps. No ataxia noted, no magnetic gait, however she needs 1 to 2 person assist to take steps around the exam room.  Tremor: no resting tremor, minimal bilateral postural tremor, no action tremor   IMPRESSION: This is a 78 year old right-handed woman with a history DM2, OSA on CPAP, atrial fibrillation on anticoagulation, CAD, COPD, depression, anxiety, presenting for evaluation of dizziness. They report that the dizziness has improved with treatment of atrial fibrillation, however she has had progressive gait dysfunction over the past 3 months, now unable to ambulate independently. She has also had a rather acute cognitive decline now requiring assistance with ADLs. She has had urinary incontinence in the past year. Etiology of symptoms unclear, her exam does not show any parkinsonian signs or supranuclear signs. No significant neuropathy noted as well. MRI cervical spine no myelopathy, no acute changes on brain MRI. Although her brain MRI report did not indicate ventriculomegaly, the ventricles appear slightly more  enlarged although there is also diffuse volume loss. With triad of symptoms, she will be referred for NPH evaluation with Dr. Junjua. Check levels of B vitamins (B1, B6, B12), vitamin E, vitamin D. She will be scheduled for Neurocognitive evaluation to further evaluate  cognitive changes. Follow-up after tests, call for any changes.    Thank you for allowing me to participate in the care of this patient. Please do not hesitate to call for any questions or concerns.   Darice Shivers, M.D.  CC: Dr. Teresa, Worth Moloney, [place]

## 2023-11-18 NOTE — Patient Instructions (Addendum)
 Good to meet you.  Schedule Neurocognitive testing  2. Referral will be sent for evaluation of NPH with Dr. Janjua  3. Follow-up after testing, call for any changes

## 2023-11-19 ENCOUNTER — Other Ambulatory Visit: Payer: Self-pay | Admitting: *Deleted

## 2023-11-19 DIAGNOSIS — E559 Vitamin D deficiency, unspecified: Secondary | ICD-10-CM

## 2023-11-19 DIAGNOSIS — R296 Repeated falls: Secondary | ICD-10-CM

## 2023-11-19 DIAGNOSIS — R4189 Other symptoms and signs involving cognitive functions and awareness: Secondary | ICD-10-CM

## 2023-11-22 ENCOUNTER — Other Ambulatory Visit: Payer: Self-pay | Admitting: *Deleted

## 2023-11-22 ENCOUNTER — Telehealth: Payer: Self-pay | Admitting: *Deleted

## 2023-11-22 ENCOUNTER — Encounter: Payer: Self-pay | Admitting: Nurse Practitioner

## 2023-11-22 ENCOUNTER — Telehealth: Payer: Self-pay | Admitting: Neurology

## 2023-11-22 ENCOUNTER — Inpatient Hospital Stay

## 2023-11-22 ENCOUNTER — Inpatient Hospital Stay: Attending: Nurse Practitioner | Admitting: Nurse Practitioner

## 2023-11-22 ENCOUNTER — Ambulatory Visit

## 2023-11-22 ENCOUNTER — Telehealth: Payer: Self-pay

## 2023-11-22 VITALS — BP 103/67 | HR 83 | Temp 97.7°F | Resp 18 | Ht 61.0 in | Wt 203.3 lb

## 2023-11-22 DIAGNOSIS — E559 Vitamin D deficiency, unspecified: Secondary | ICD-10-CM | POA: Diagnosis not present

## 2023-11-22 DIAGNOSIS — R4189 Other symptoms and signs involving cognitive functions and awareness: Secondary | ICD-10-CM

## 2023-11-22 DIAGNOSIS — D509 Iron deficiency anemia, unspecified: Secondary | ICD-10-CM | POA: Diagnosis not present

## 2023-11-22 DIAGNOSIS — R296 Repeated falls: Secondary | ICD-10-CM | POA: Diagnosis not present

## 2023-11-22 DIAGNOSIS — D72829 Elevated white blood cell count, unspecified: Secondary | ICD-10-CM | POA: Diagnosis not present

## 2023-11-22 LAB — CBC WITH DIFFERENTIAL (CANCER CENTER ONLY)
Abs Immature Granulocytes: 0.11 K/uL — ABNORMAL HIGH (ref 0.00–0.07)
Basophils Absolute: 0 K/uL (ref 0.0–0.1)
Basophils Relative: 0 %
Eosinophils Absolute: 0.1 K/uL (ref 0.0–0.5)
Eosinophils Relative: 0 %
HCT: 40.3 % (ref 36.0–46.0)
Hemoglobin: 12.5 g/dL (ref 12.0–15.0)
Immature Granulocytes: 1 %
Lymphocytes Relative: 8 %
Lymphs Abs: 1.4 K/uL (ref 0.7–4.0)
MCH: 25.6 pg — ABNORMAL LOW (ref 26.0–34.0)
MCHC: 31 g/dL (ref 30.0–36.0)
MCV: 82.6 fL (ref 80.0–100.0)
Monocytes Absolute: 0.9 K/uL (ref 0.1–1.0)
Monocytes Relative: 5 %
Neutro Abs: 15.1 K/uL — ABNORMAL HIGH (ref 1.7–7.7)
Neutrophils Relative %: 86 %
Platelet Count: 423 K/uL — ABNORMAL HIGH (ref 150–400)
RBC: 4.88 MIL/uL (ref 3.87–5.11)
RDW: 16.9 % — ABNORMAL HIGH (ref 11.5–15.5)
WBC Count: 17.5 K/uL — ABNORMAL HIGH (ref 4.0–10.5)
nRBC: 0 % (ref 0.0–0.2)

## 2023-11-22 LAB — FERRITIN: Ferritin: 164 ng/mL (ref 11–307)

## 2023-11-22 LAB — SAVE SMEAR(SSMR), FOR PROVIDER SLIDE REVIEW

## 2023-11-22 NOTE — Telephone Encounter (Signed)
 Patient gave verbal understanding and had no further questions.

## 2023-11-22 NOTE — Telephone Encounter (Signed)
-----   Message from Olam Ned sent at 11/22/2023 12:38 PM EDT ----- Please let her know ferritin level is normal.  Follow-up as scheduled.

## 2023-11-22 NOTE — Telephone Encounter (Signed)
 Error

## 2023-11-22 NOTE — Progress Notes (Signed)
  Sarah Miller OFFICE PROGRESS NOTE   Diagnosis: Anemia  INTERVAL HISTORY:   Sarah Miller returns for scheduled follow-up.  She is no longer taking oral iron.  She denies bleeding.  No black stools.  She had bilateral steroid knee injections about a week ago.  She reports a diminished appetite and intermittent nausea since starting Mounjaro.  She is losing weight.  She attributes this to Mounjaro.  She is accompanied by her granddaughter to today's visit.  They report she is undergoing evaluation for falls/gait disturbance.  Objective:  Vital signs in last 24 hours:  Blood pressure 103/67, pulse 83, temperature 97.7 F (36.5 C), temperature source Temporal, resp. rate 18, height 5' 1 (1.549 m), weight 203 lb 4.8 oz (92.2 kg), SpO2 94%.    Resp: Lungs clear bilaterally. Cardio: Regular rate and rhythm. GI: No hepatosplenomegaly. Vascular: No leg edema.   Lab Results:  Lab Results  Component Value Date   WBC 19.2 (H) 08/27/2023   HGB 14.3 08/27/2023   HCT 42.0 08/27/2023   MCV 84.6 08/27/2023   PLT 392 08/27/2023   NEUTROABS 13.7 (H) 08/26/2023    Imaging:  No results found.  Medications: I have reviewed the patient's current medications.  Assessment/Plan: Microcytic anemia Upper endoscopy 07/26/2020-normal esophagus, erythematous mucosa in the gastric body and antrum, normal examined duodenum (duodenum biopsy showed small bowel mucosa with no significant pathologic changes, negative for features of celiac disease; stomach biopsy with chronic inactive gastritis, negative for H. pylori organisms) Colonoscopy 07/26/2020-five 4 to 6 mm polyps in the sigmoid colon, descending colon, transverse colon and hepatic flexure, diverticulosis in the sigmoid colon and in the descending colon, nonbleeding internal hemorrhoids (tubular adenomas x2, hyperplastic polyps, polypoid colonic mucosa, negative for dysplasia). Camera endoscopy  06/13/2021-normal COPD CAD Hypertension Diabetes Neutrophilia, present for several years  Disposition: Sarah Miller remains stable from a hematologic standpoint.  Hemoglobin is normal today.  White count remains mildly elevated, potentially related to the frequent steroid knee injections.  She will return for lab and follow-up in 4 months.  We are available to see her sooner if needed.    Sarah Miller ANP/GNP-BC   11/22/2023  10:45 AM

## 2023-11-22 NOTE — Telephone Encounter (Signed)
 Daughter called to report continued decline with multiple falls. She is living w/her now. Neurologist office was supposed to call/fax orders for lab work while here today to check for some vitamin deficiencies. They have also made a NS referral to assess for hydrocephalus. Wanted NP aware when she saw patient today.

## 2023-11-28 DIAGNOSIS — N183 Chronic kidney disease, stage 3 unspecified: Secondary | ICD-10-CM | POA: Diagnosis not present

## 2023-11-28 DIAGNOSIS — N390 Urinary tract infection, site not specified: Secondary | ICD-10-CM | POA: Diagnosis not present

## 2023-11-29 ENCOUNTER — Ambulatory Visit: Payer: Self-pay

## 2023-11-29 ENCOUNTER — Ambulatory Visit (INDEPENDENT_AMBULATORY_CARE_PROVIDER_SITE_OTHER): Admitting: Psychology

## 2023-11-29 ENCOUNTER — Encounter: Payer: Self-pay | Admitting: Psychology

## 2023-11-29 DIAGNOSIS — K219 Gastro-esophageal reflux disease without esophagitis: Secondary | ICD-10-CM | POA: Insufficient documentation

## 2023-11-29 DIAGNOSIS — R4189 Other symptoms and signs involving cognitive functions and awareness: Secondary | ICD-10-CM

## 2023-11-29 DIAGNOSIS — F3340 Major depressive disorder, recurrent, in remission, unspecified: Secondary | ICD-10-CM | POA: Insufficient documentation

## 2023-11-29 DIAGNOSIS — D72828 Other elevated white blood cell count: Secondary | ICD-10-CM | POA: Insufficient documentation

## 2023-11-29 DIAGNOSIS — E559 Vitamin D deficiency, unspecified: Secondary | ICD-10-CM | POA: Insufficient documentation

## 2023-11-29 DIAGNOSIS — J309 Allergic rhinitis, unspecified: Secondary | ICD-10-CM | POA: Insufficient documentation

## 2023-11-29 DIAGNOSIS — M8580 Other specified disorders of bone density and structure, unspecified site: Secondary | ICD-10-CM | POA: Insufficient documentation

## 2023-11-29 DIAGNOSIS — G4733 Obstructive sleep apnea (adult) (pediatric): Secondary | ICD-10-CM | POA: Insufficient documentation

## 2023-11-29 DIAGNOSIS — F419 Anxiety disorder, unspecified: Secondary | ICD-10-CM | POA: Insufficient documentation

## 2023-11-29 DIAGNOSIS — E785 Hyperlipidemia, unspecified: Secondary | ICD-10-CM | POA: Insufficient documentation

## 2023-11-29 DIAGNOSIS — D509 Iron deficiency anemia, unspecified: Secondary | ICD-10-CM | POA: Insufficient documentation

## 2023-11-29 DIAGNOSIS — I700 Atherosclerosis of aorta: Secondary | ICD-10-CM | POA: Insufficient documentation

## 2023-11-29 DIAGNOSIS — N183 Chronic kidney disease, stage 3 unspecified: Secondary | ICD-10-CM | POA: Insufficient documentation

## 2023-11-29 HISTORY — DX: Atherosclerosis of aorta: I70.0

## 2023-11-29 NOTE — Progress Notes (Signed)
   Neuropsychology Note [facility] Department of Neurology     Sarah Miller presented to her appointment as scheduled. Her daughter noted that she had very recently been diagnosed with a UTI and was currently taking antibiotics. Sarah Miller also noted acute symptoms of nausea. Given her UTI diagnosis and ongoing treatment, testing was not recommended. She will be rescheduled at her earliest convenience.

## 2023-12-01 LAB — VITAMIN D 1,25 DIHYDROXY
Vitamin D 1, 25 (OH)2 Total: 32 pg/mL
Vitamin D2 1, 25 (OH)2: <10 pg/mL
Vitamin D3 1, 25 (OH)2: 32 pg/mL

## 2023-12-01 LAB — VITAMIN B1: Thiamine: 118.6 nmol/L (ref 66.5–200.0)

## 2023-12-01 LAB — VITAMIN B6: Vitamin B6: 3.3 ug/L — ABNORMAL LOW (ref 3.4–65.2)

## 2023-12-01 LAB — VITAMIN E
Vitamin E (Alpha Tocopherol): 9.8 mg/L (ref 9.0–29.0)
Vitamin E(Gamma Tocopherol): 2.5 mg/L (ref 0.5–4.9)

## 2023-12-01 LAB — VITAMIN B12: Vitamin B-12: 329 pg/mL (ref 232–1245)

## 2023-12-02 ENCOUNTER — Ambulatory Visit: Payer: Self-pay | Admitting: Neurology

## 2023-12-02 NOTE — Telephone Encounter (Signed)
 Pt daughter verified have an appt-Neurosurgeon Oct 21.

## 2023-12-03 NOTE — Progress Notes (Signed)
 Pt been notified below message.

## 2023-12-04 ENCOUNTER — Encounter: Payer: Self-pay | Admitting: Psychology

## 2023-12-13 ENCOUNTER — Ambulatory Visit: Admitting: Psychology

## 2023-12-13 ENCOUNTER — Encounter: Payer: Self-pay | Admitting: Psychology

## 2023-12-13 ENCOUNTER — Ambulatory Visit: Payer: Self-pay

## 2023-12-13 DIAGNOSIS — G9389 Other specified disorders of brain: Secondary | ICD-10-CM | POA: Insufficient documentation

## 2023-12-13 DIAGNOSIS — R32 Unspecified urinary incontinence: Secondary | ICD-10-CM | POA: Insufficient documentation

## 2023-12-13 DIAGNOSIS — R296 Repeated falls: Secondary | ICD-10-CM | POA: Insufficient documentation

## 2023-12-13 DIAGNOSIS — F0670 Mild neurocognitive disorder due to known physiological condition without behavioral disturbance: Secondary | ICD-10-CM | POA: Diagnosis not present

## 2023-12-13 DIAGNOSIS — R4189 Other symptoms and signs involving cognitive functions and awareness: Secondary | ICD-10-CM

## 2023-12-13 NOTE — Progress Notes (Signed)
   Psychometrician Note   Cognitive testing was administered to Sarah Miller by Evalene Pizza, B.S. (psychometrist) under the supervision of Dr. Arthea KYM Maryland, Ph.D., ABPP, licensed psychologist on 12/13/2023. [name] did not appear overtly distressed by the testing session per behavioral observation or responses across self-report questionnaires. Rest breaks were offered.   The battery of tests administered was selected by Dr. Zachary C. Merz, Ph.D., ABPP with consideration to [name]'s current level of functioning, the nature of her symptoms, emotional and behavioral responses during interview, level of literacy, observed level of motivation/effort, and the nature of the referral question. This battery was communicated to the psychometrist. Communication between Dr. Arthea KYM Maryland, Ph.D., ABPP and the psychometrist was ongoing throughout the evaluation and Dr. Arthea KYM Maryland, Ph.D., ABPP was immediately accessible at all times. Dr. Zachary C. Merz, Ph.D., ABPP provided supervision to the psychometrist on the date of this service to the extent necessary to assure the quality of all services provided.    Sarah Miller will return within approximately 1-2 weeks for an interactive feedback session with Dr. Maryland at which time her test performances, clinical impressions, and treatment recommendations will be reviewed in detail. [name] understands she can contact our office should she require our assistance before this time.  A total of 157 minutes of billable time were spent face-to-face with [name] by the psychometrist. This includes both test administration and scoring time. Billing for these services is reflected in the clinical report generated by Dr. Arthea KYM Maryland, Ph.D., ABPP  This note reflects time spent with the psychometrician and does not include test scores or any clinical interpretations made by Dr. Maryland. The full report will follow in a separate note.

## 2023-12-13 NOTE — Progress Notes (Unsigned)
 NEUROPSYCHOLOGICAL EVALUATION [place]. [facility] Department of Neurology  Date of Evaluation: December 13, 2023  Reason for Referral:   Sarah Miller is a 78 y.o. right-handed Caucasian female referred by Darice Shivers, M.D., to characterize her current cognitive functioning and assist with diagnostic clarity and treatment planning in the context of subjective cognitive decline.   Assessment and Plan:   Clinical Impression(s): Sarah Miller pattern of performance is suggestive of primary impairments surrounding executive functioning, semantic fluency, visuospatial abilities, and both encoding (i.e., learning) and delayed retrieval aspects of verbal memory. Further weakness/variability was exhibited across processing speed. Performances were appropriate relative to age-matched peers across attention/concentration, receptive language, phonemic fluency, confrontation naming, delayed retrieval aspects of visual memory, and recognition/consolidation aspects of memory broadly. Functionally, her daughter has become more involved in instrumental instrumental activities of daily living (ADLs) largely due to Sarah Miller's fall risk and overall balance concerns. Overall, given current testing, Sarah Miller best meets diagnostic criteria for a Mild Neurocognitive Disorder (mild cognitive impairment) at the present time.  Regarding the cause for ongoing cognitive impairment, I do have concerns surrounding normal pressure hydrocephalus (NPH). Cerebral ventriculomegaly appears prominent by my view of her most recent brain MRI this past July. Clinically, she has described a notable change in her balance and gait, including frequent falls, as well as urinary incontinence ongoing for the past year or so. Current testing patterns favoring frontal-subcortical dysfunction and verbal memory align well with what would be expected with this sort of presentation. Overall, ventriculomegaly with  the combination of balance instability, incontinence, and cognitive impairment would represent a fairly classic clinical presentation of NPH. Further work-up surrounding these concerns is strongly recommended.   Outside of NPH, prominent balance instability and frequent falling in a backwards direction (as reported by her daughter) could raise concern for progressive supranuclear palsy (PSP). Sarah Miller. Leath and her daughter also expressed concerns surrounding tremulous experiences. These were very subtle presently and largely postural/action based. Current testing patterns could be exhibited in PSP presentations. However, there is no report of oculomotor dysfunction and neuroimaging would favor NPH given ventricular enlargement and no reported midbrain atrophy. This could represent another differential diagnosis to consider should members of her medical team not feel that NPH best characterizes ongoing symptoms.   There could also be a vascular contribution to cognitive impairment given recent neuroimaging suggesting mild to moderate microvascular ischemic disease. Current testing does not suggest compelling evidence for symptomatic Alzheimer's disease at the present time. Continued medical monitoring will be important moving forward.   Recommendations: Sarah Miller is scheduled to meet with a neurosurgeon on 12/17/2023 assumedly to discuss NPH concerns. She is strongly encouraged to keep this appointment.   A repeat neuropsychological evaluation in future could be considered, especially depending on NPH concerns and subsequent treatment (if applicable).   Should there be progression of current deficits over time, Sarah Miller is unlikely to regain any independent living skills lost. Therefore, it is recommended that she remain as involved as possible in all aspects of household chores, finances, and medication management, with supervision to ensure adequate performance. she will likely benefit from the  establishment and maintenance of a routine in order to maximize her functional abilities over time.  It will be important for Sarah Miller to have another person with her when in situations where she may need to process information, weigh the pros and cons of different options, and make decisions, in order to ensure that she fully understands and  recalls all information to be considered.  If not already done, Sarah Miller and her family may want to discuss her wishes regarding durable power of attorney and medical decision making, so that she can have input into these choices. If they require legal assistance with this, long-term care resource access, or other aspects of estate planning, they could reach out to [employer] at [phone] for a free consultation. Additionally, they may wish to discuss future plans for caretaking and seek out community options for in home/residential care should they become necessary.  Sarah Miller is encouraged to attend to lifestyle factors for brain health (e.g., regular physical exercise, good nutrition habits and consideration of the MIND-DASH diet, regular participation in cognitively-stimulating activities, and general stress management techniques), which are likely to have benefits for both emotional adjustment and cognition. In fact, in addition to promoting good general health, regular exercise incorporating aerobic activities (e.g., brisk walking, jogging, cycling, etc.) has been demonstrated to be a very effective treatment for depression and stress, with similar efficacy rates to both antidepressant medication and psychotherapy. Optimal control of vascular risk factors (including safe cardiovascular exercise and adherence to dietary recommendations) is encouraged. Likewise, continued compliance with her CPAP machine will also be important. Continued participation in activities which provide mental stimulation and social interaction is also recommended.    Important information should be provided to Sarah Miller. Scarantino in written format in all instances. This information should be placed in a highly frequented and easily visible location within her home to promote recall. External strategies such as written notes in a consistently used memory journal, visual and nonverbal auditory cues such as a calendar on the refrigerator or appointments with alarm, such as on a cell phone, can also help maximize recall.  To address problems with processing speed, she may wish to consider:   -Ensuring that she is alerted when essential material or instructions are being presented   -Adjusting the speed at which new information is presented   -Allowing for more time in comprehending, processing, and responding in conversation   -Repeating and paraphrasing instructions or conversations aloud  To address problems with fluctuating attention and/or executive dysfunction, she may wish to consider:   -Avoiding external distractions when needing to concentrate   -Limiting exposure to fast paced environments with multiple sensory demands   -Writing down complicated information and using checklists   -Attempting and completing one task at a time (i.e., no multi-tasking)   -Verbalizing aloud each step of a task to maintain focus   -Taking frequent breaks during the completion of steps/tasks to avoid fatigue   -Reducing the amount of information considered at one time   -Scheduling more difficult activities for a time of day where she is usually most alert  Review of Records:   Sarah Miller was seen by [facility] Elray Shivers, M.D.) on 11/18/2023. Per her notes: Sarah Miller was referred in July 2025 for dizziness and frequent falls. She reported dizziness worse when looking to the left. I personally reviewed MRI brain with and without contrast done 08/2023 which did not show any acute changes, there was mild to moderate chronic microvascular disease. Ventricles appear  slightly enlarged with note of diffuse cerebral and cerebellar volume loss. On initial vestibular therapy evaluation in 08/2023, all positional testing was negative however her VOR and HIT were both impaired indicative of a vestibular hypofunction, L>R. They report that after atrial fibrillation was treated, the dizziness resolved. Main concern today is the progressive gait dysfunction  despite improvement in dizziness. She has had a significant amount of falls. [name] reports balance changes started around a year ago, worsening in the Spring/Summer of 2025, significantly worse in June where she had several falls. [name] recalls that 3-4 months ago she was walking independently but not well, however now she cannot even get out of bed without assistance. She is off balance, she falls backward or to the side. [name] describes her movements as like a baby and seemingly worse on the left side.  She has a zombie walk and does not seem to engage her core. She has to hold on to the shower bar as her daughter washes her. If she focuses on it, she can do better with walking however she needs a 1-person assist, unable to use a walker because she would fall backward. She has a prescription for a wheelchair now. She denies any numbness/tingling/weakness, no neck or back pain. She had an MRI cervical spine without contrast earlier this month with no myelopathy seen, there was multilevel cervical spondylosis with mild spinal canal stenosis at C3-4 and C4-5, severe left C4 foraminal stenosis, mild chronic compression deformities involving the superior endplates of T1 and T2 without retropulsion. On her most recent PT session 9/19, they reported a fall off the toilet as she was reaching for the bar and landed on the floor. She was noted to have abnormal gait, decreased balance, decreased knowledge of condition, decreased knowledge of use of DME, decreased strength, and dizziness.   Cognitive changes also started in  Spring/Summer 2025. She had been living alone independently up until her admission for atrial fibrillation in June 2025. [name] started noticing she would forget her medications, [name] would text and she would report she forgot them so she took over medications in June. [name] had to move in with her at that time and noticed progressive cognitive decline. She repeats herself, [name] has told her 15 times already where they were going today. She lost her email, forgets her passwords, she can't type anything. She calls things different names, she was supposed to prick her finger to check blood then went on to use the wrong medication. [name] reports focus is a big thing, there is no focus. She is also less inhibited with family, no filter in the past year. She is impulsive. She continues to do well managing finances, most are on autopay, [name] checks behind her. She stopped driving locally within the past year, she denied getting lost, it was getting more difficult to get down to the stairs to her car. There is no family history of cognitive changes or similar gait difficulties. No history of head injuries. She started to have urinary incontinence over a year ago, she recently started wearing Depends. She has interrupted sleep due to urination, getting up to use the bathroom however still needing to change the sheets daily. [name] repots she lays down or sits most of the day, on her phone or TV. She has been unable to do PT home exercises due to their fear of falling.  Ultimately, Sarah Miller was referred for a comprehensive neuropsychological evaluation to characterize her cognitive abilities and to assist with diagnostic clarity and treatment planning.   Past Medical History:  Diagnosis Date   Allergic rhinitis    Anxiety    Atrial fibrillation with RVR    CAD (coronary artery disease), native coronary artery    coronary CTA done 12/25/2019 showing coronary calcium score of 60 which was  72nd percentile for age  and sex matched controls along with 50 to 70% proximal RCA that was nondominant, less than 25% mid LAD as well as mid left circumflex.   Cerebral ventriculomegaly 12/13/2023   Cervical arthritis    [name], S/P injection with good results.    Chronic diastolic CHF (congestive heart failure)    COPD (chronic obstructive pulmonary disease)    with emphysema     DOE (dyspnea on exertion) 02/03/2019   - PFTs 02/02/2019 unable to do spirometry, mod decreased lung vol with ERV 10% c/w obesity effects      Dyslipidemia    Early menopause    Frequent falls    Frozen shoulder    H/O frozen shoulder and rotator cuff tendonititis, L s/p injection   Gastroesophageal reflux disease without esophagitis    Hardening of the aorta (main artery of the heart) 11/29/2023   History of melanoma    1978 and 1979   Intrinsic sphincter deficiency (ISD) 02/06/2017   Added automatically from request for surgery [id number]     Iron deficiency anemia    Morbid obesity 02/03/2019   PFTs 02/02/2019 with erv 10% c/w body habitus     Neutrophilia    Obstructive sleep apnea    Osteopenia    PONV (postoperative nausea and vomiting)    Pulmonary hypertension    RBBB (right bundle branch block) 03/19/2013   Recurrent major depression in remission    Stage 3 chronic kidney disease    Type 2 diabetes mellitus    Upper airway cough syndrome vs cough variant asthma 04/18/2018   Onset mid sept 2019  - Allergy  profile  02/28/2018    IgE  312  RAST neg     - 04/17/2018 d/c breo/ max rx for gerd/ cyclical cough   - PFTs 02/02/2019 unable to do spirometry, mod decreased lung vol with ERV 10% c/w obesity effects   - 08/05/2019  After extensive coaching inhaler device,  effectiveness =    75% (short Ti)      Urinary incontinence    Vitamin D deficiency     Past Surgical History:  Procedure Laterality Date   ABDOMINAL HYSTERECTOMY     CATARACT EXTRACTION W/ INTRAOCULAR LENS IMPLANT Bilateral     FRACTURE SURGERY Left    wrist   MELANOMA EXCISION     REVERSE SHOULDER ARTHROPLASTY Left 12/28/2019   Procedure: REVERSE SHOULDER ARTHROPLASTY;  Surgeon: [name], MD;  Location: [facility];  Service: Orthopedics;  Laterality: Left;    Current Outpatient Medications:    albuterol  (VENTOLIN  HFA) 108 (90 Base) MCG/ACT inhaler, Inhale 1-2 puffs into the lungs every 6 (six) hours as needed for wheezing or shortness of breath., Disp: , Rfl:    apixaban  (ELIQUIS ) 5 MG TABS tablet, Take 1 tablet (5 mg total) by mouth 2 (two) times daily., Disp: 180 tablet, Rfl: 1   atorvastatin (LIPITOR) 10 MG tablet, Take 10 mg by mouth daily., Disp: , Rfl: 3   buPROPion  (WELLBUTRIN  XL) 300 MG 24 hr tablet, Take 300 mg by mouth daily. , Disp: , Rfl:    FLUoxetine (PROZAC) 10 MG capsule, Take 30 mg by mouth daily., Disp: , Rfl:    fluticasone  (FLONASE ) 50 MCG/ACT nasal spray, SPRAY 2 SPRAYS INTO EACH NOSTRIL EVERY DAY, Disp: 48 mL, Rfl: 1   furosemide  (LASIX ) 20 MG tablet, Take 1 tablet (20 mg total) by mouth daily as needed for fluid or edema. (Patient not taking: Reported on 11/22/2023), Disp: 30 tablet, Rfl:  0   Insulin  Glargine (BASAGLAR  KWIKPEN) 100 UNIT/ML, Inject 20 Units into the skin at bedtime., Disp: , Rfl:    meclizine (ANTIVERT) 25 MG tablet, Take 25 mg by mouth every 8 (eight) hours as needed. (Patient not taking: Reported on 11/22/2023), Disp: , Rfl:    metFORMIN (GLUCOPHAGE-XR) 500 MG 24 hr tablet, Take 500 mg by mouth in the morning and at bedtime. 500 mg in morning and 500 mg at night, Disp: , Rfl: 1   montelukast  (SINGULAIR ) 10 MG tablet, TAKE 1 TABLET BY MOUTH EVERYDAY AT BEDTIME, Disp: 90 tablet, Rfl: 0   MOUNJARO 7.5 MG/0.5ML Pen, Inject 7.5 mg into the skin once a week., Disp: , Rfl:    ondansetron  (ZOFRAN ) 4 MG tablet, Take 4 mg by mouth every 8 (eight) hours as needed for nausea or vomiting., Disp: , Rfl:    ONETOUCH ULTRA TEST test strip, SMARTSIG:50 Daily, Disp: , Rfl:    Vitamin D,  Ergocalciferol, (DRISDOL) 1.25 MG (50000 UNIT) CAPS capsule, Take 50,000 Units by mouth every 7 (seven) days., Disp: , Rfl:   Neuroimaging: Brain MRI on 09/04/2023 suggested age-related cerebral and cerebellar volume loss (per the reading neuroradiologist) and mild to moderate microvascular ischemic disease.   Clinical Interview:   The following information was obtained during a clinical interview with Sarah Miller and her daughter prior to cognitive testing.  Cognitive Symptoms: Sarah Miller and her daughter described prominent difficulties surrounding processing speed, sustained attention and increased distractibility, organization, multi-tasking, word finding, and short-term memory. Attentional dysregulation was said to be somewhat longstanding in nature, likely dating back to adolescence and negatively impacted academic pursuits. She was never formally evaluated or treated for ADHD in the past. Other difficulties have surfaced within the past several months, generally following reports of balance instability and frequent falling behaviors (see above). They have mildly progressed over time.   Difficulties completing ADLs: Since Sarah Miller started having frequent falling concerns, her daughter has largely taken over medication management and driving responsibilities. Prior to this, Sarah Miller was fairly independent. She continues to be fairly independent with financial management and bill paying currently.   Additional Medical History: History of traumatic brain injury/concussion: Denied. History of stroke: Denied. History of seizure activity: Denied. History of known exposure to toxins: Denied. Symptoms of chronic pain: She and her daughter acknowledged pain surrounding her knees, shoulder, and back. However, symptoms are manageable and generally do not require over-the-counter medication.  Experience of frequent headaches/migraines: Denied. Frequent instances of dizziness/vertigo:  Denied.  Sensory changes: She utilizes glasses with benefit. Other sensory changes/difficulties (e.g., hearing, taste, smell) were denied.  Balance/coordination difficulties: Endorsed (see above). Of note, her daughter did remark that Sarah Miller commonly loses her balance and falls backwards. They both denied dizziness or vertigo as a cause for falling behaviors.  Other motor difficulties: They described some very mild action tremors primarily in her left hand.   Sleep History: Estimated hours obtained each night: 7-8 hours.  Difficulties falling asleep: Denied. Difficulties staying asleep: Denied. Feels rested and refreshed upon awakening: Endorsed.  History of snoring: Endorsed. History of waking up gasping for air: Endorsed. Witnessed breath cessation while asleep: Endorsed. She reported a history of obstructive sleep apnea and noted recently resuming the use of her CPAP machine. She reported attempting to start each night utilizing this device.   History of vivid dreaming: Endorsed. Excessive movement while asleep: Denied. Instances of acting out her dreams: Denied. Her daughter did note that Sarah Miller will talk  or make vocalizations in her sleep from time to time.   Psychiatric/Behavioral Health History: Depression: She acknowledged a longstanding history of generally mild depressive experiences. Current medications were said to be helpful in this regard. She described her current mood as fairly positive overall. Current or remote suicidal ideation, intent, or plan was denied.  Anxiety: She reported a longstanding history of generalized anxious distress. Symptoms were said to be mild currently, largely surrounding her current medical concerns.  Mania: Denied. Trauma History: Denied. Visual/auditory hallucinations: Denied. Delusional thoughts: Denied.  Tobacco: Denied. Alcohol: She denied current alcohol consumption as well as a history of problematic alcohol abuse or  dependence.  Recreational drugs: Denied.  Family History: Problem Relation Age of Onset   Cancer Mother        ovarian cancer   Hypertension Father    Diabetes Father    Heart attack Father    Cancer Father        testicular   CAD Father    Heart disease Father    Hyperlipidemia Father    Heart attack Brother 52   CAD Brother    Heart disease Brother    CAD Paternal Grandfather    Hypertension Sister    Cancer Other    Hyperlipidemia Other    Heart disease Other    This information was confirmed by Sarah Miller. Keadle.  Academic/Vocational History: Highest level of educational attainment: 12 years. She graduated from high school. She described herself as an average (C) student in academic settings. She noted trouble with sustained attention and distractibility throughout early childhood settings, commenting that she was often pulled from class due to being overly talkative. She was never evaluated for or diagnosed with ADHD. Math was noted to be a relative weakness across subjects.  History of developmental delay: Denied. History of grade repetition: Denied. Enrollment in special education courses: Denied. History of LD: Denied.  Employment: Retired. She previously worked in Visual merchandiser.   Evaluation Results:   Behavioral Observations: Sarah Miller. Slager was accompanied by her daughter, arrived to her appointment on time, and was appropriately dressed and groomed. She appeared alert. She ambulated in a wheelchair, making her gait and station unable to be observed. Gross motor functioning appeared intact upon informal observation and no abnormal movements (e.g., tremors) were noted. Her affect was generally relaxed and positive. Spontaneous speech was fluent and word finding difficulties were not observed during the clinical interview. Thought processes were coherent, organized, and normal in content. Insight into her cognitive difficulties appeared adequate.   There was an  error in administration where an incorrect digit span subtest was administrated by the psychometrist. What was completed in lieu of the original task selected by Dr. Richie was scored and can be seen below. During testing, sustained attention was appropriate. Task engagement was adequate and she persisted when challenged. She was noted to only use her right hand across a block manipulation task. Overall, Sarah Miller. Labo was cooperative with the clinical interview and subsequent testing procedures.   Adequacy of Effort: The validity of neuropsychological testing is limited by the extent to which the individual being tested may be assumed to have exerted adequate effort during testing. Sarah Miller. Montesano expressed her intention to perform to the best of her abilities and exhibited adequate task engagement and persistence. Scores across stand-alone and embedded performance validity measures were within expectation. As such, the results of the current evaluation are believed to be a valid representation of Sarah Miller. Baldo's current cognitive functioning.  Test  Results: Sarah Miller. Brim was fully oriented at the time of the current evaluation.  Intellectual abilities based upon educational and vocational attainment were estimated to be in the average range. Premorbid abilities were estimated to be within the average range based upon a single-word reading test.   Processing speed was variable, ranging from the exceptionally low to average normative ranges. Basic attention was average. More complex attention (e.g., working memory) was also average. Executive functioning was exceptionally low to below average.  While not directly assessed, receptive language abilities were believed to be intact. Sarah Miller. Monical did not exhibit prominent difficulties comprehending task instructions and answered all questions asked of her appropriately. Assessed expressive language was variable. Phonemic fluency was below average, semantic fluency was  exceptionally low, and confrontation naming was average to above average.    Assessed visuospatial/visuoconstructional abilities were exceptionally low to below average. Across her drawing of a clock, she exhibited fairly significant numerical spacing abnormalities, with essentially no numbers placed in the bottom right or upper left quadrant. The clock hands were also placed incorrectly. Across her copy of a complex figure, points were lost for a several mild visual distortions.   Learning (i.e., encoding) of novel verbal information was exceptionally low. Spontaneous delayed recall (i.e., retrieval) of previously learned information was below average across a visual task but exceptionally low across both verbal tasks. Retention rates were 0% across a list learning task, 25% across a story learning task, and 50% across a figure drawing task. Performance across recognition tasks was below average to average, suggesting evidence for information consolidation.   Results of emotional screening instruments suggested that recent symptoms of generalized anxiety were in the mild range, while symptoms of depression were also within the mild range. A screening instrument assessing recent sleep quality suggested the presence of minimal sleep dysfunction.  Table of Scores:   Note: This summary of test scores accompanies the interpretive report and should not be considered in isolation without reference to the appropriate sections in the text. Descriptors are based on appropriate normative data and may be adjusted based on clinical judgment. Terms such as Within Normal Limits and Outside Normal Limits are used when a more specific description of the test score cannot be determined.       Percentile - Normative Descriptor > 98 - Exceptionally High 91-97 - Well Above Average 75-90 - Above Average 25-74 - Average 9-24 - Below Average 2-8 - Well Below Average < 2 - Exceptionally Low       Validity:    DESCRIPTOR       DCT: --- --- Within Normal Limits  RBANS EI: --- --- Within Normal Limits       Orientation:      Raw Score Percentile   NAB Orientation, Form 1 29/29 --- ---       Cognitive Screening:      Raw Score Percentile   SLUMS: 22/30 --- ---       RBANS, Form A: Standard Score/ Scaled Score Percentile   Total Score 60 <1 Exceptionally Low  Immediate Memory 49 <1 Exceptionally Low    List Learning 1 <1 Exceptionally Low    Story Memory 3 1 Exceptionally Low  Visuospatial/Constructional 66 1 Exceptionally Low    Figure Copy 7 16 Below Average    Line Orientation 6/20 <2 Exceptionally Low  Language 82 12 Below Average    Picture Naming 10/10 51-75 Average    Semantic Fluency 3 1 Exceptionally Low  Attention 79 8 Well  Below Average    Digit Span 12 75 Above Average    Coding 1 <1 Exceptionally Low  Delayed Memory 64 1 Exceptionally Low    List Recall 0/10 <2 Exceptionally Low    List Recognition 17/20 10-16 Below Average    Story Recall 2 <1 Exceptionally Low    Story Recognition 8/12 8-15 Below Average    Figure Recall 7 16 Below Average    Figure Recognition 6/8 30-52 Average        Intellectual Functioning:      Standard Score Percentile   Test of Premorbid Functioning: 99 47 Average       Attention/Executive Function:     Trail Making Test (TMT): Raw Score (T Score) Percentile     Part A 78 secs.,  1 error (30) 2 Well Below Average    Part B Discontinued --- Impaired         Scaled Score Percentile   WAIS-5 Coding: 5 5 Well Below Average  WAIS-5 Naming Speed Quantity: 10 50 Average        Scaled Score Percentile   WAIS-IV Digits Forwards (tech admin error): 8 25 Average  WAIS-5 Digits Backwards: 8 25 Average        Scaled Score Percentile   WAIS-5 Similarities: 5 5 Well Below Average  WAIS-5 Figure Weights: 7 16 Below Average  WAIS-5 Arithmetic: 5 5 Well Below Average       D-KEFS Color-Word Interference Test: Raw Score (Scaled Score) Percentile      Color Naming 71 secs. (1) <1 Exceptionally Low    Word Reading 25 secs. (10) 50 Average    Inhibition 101 secs. (6) 9 Below Average      Total Errors 17 errors (1) <1 Exceptionally Low    Inhibition/Switching Discontinued --- Impaired      Total Errors --- --- ---       D-KEFS Verbal Fluency Test: Raw Score (Scaled Score) Percentile     Letter Total Correct 22 (6) 9 Below Average    Category Total Correct 10 (1) <1 Exceptionally Low    Category Switching Total Correct 4 (1) <1 Exceptionally Low    Category Switching Accuracy 2 (1) <1 Exceptionally Low      Total Set Loss Errors 4 (8) 25 Average      Total Repetition Errors 2 (11) 63 Average       Language:     Verbal Fluency Test: Raw Score (T Score) Percentile     Phonemic Fluency (FAS) 22 (37) 9 Below Average    Animal Fluency 6 (21) <1 Exceptionally Low        NAB Language Module, Form 1: T Score Percentile     Naming 30/31 (60) 84 Above Average       Visuospatial/Visuoconstruction:      Raw Score Percentile   Clock Drawing: 6/10 --- Impaired        Scaled Score Percentile   WAIS-5 Block Design: 2 <1 Exceptionally Low       Mood and Personality:      Raw Score Percentile   Geriatric Depression Scale: 15 --- Mild  Geriatric Anxiety Scale: 15 --- Mild    Somatic 8 --- Mild    Cognitive 5 --- Mild    Affective 2 --- Minimal       Additional Questionnaires:      Raw Score Percentile   PROMIS Sleep Disturbance Questionnaire: 21 --- None to Slight   Informed Consent and Coding/Compliance:  The current evaluation represents a clinical evaluation for the purposes previously outlined by the referral source and is in no way reflective of a forensic evaluation.   Sarah Miller. Castronovo was provided with a verbal description of the nature and purpose of the present neuropsychological evaluation. Also reviewed were the foreseeable risks and/or discomforts and benefits of the procedure, limits of confidentiality, and mandatory  reporting requirements of this provider. The patient was given the opportunity to ask questions and receive answers about the evaluation. Oral consent to participate was provided by the patient.   This evaluation was conducted by Arthea KYM Maryland, Ph.D., ABPP-CN, board certified clinical neuropsychologist. Sarah Miller. Long completed a clinical interview with Dr. Maryland, billed as one unit [phone], and 157 minutes of cognitive testing and scoring, billed as one unit [phone] and four additional units [id number]. Psychometrist Evalene Pizza, B.S. assisted Dr. Maryland with test administration and scoring procedures. As a separate and discrete service, one unit [phone] and two units 96133 (160 minutes) were billed for Dr. Loralee time spent in interpretation and report writing.

## 2023-12-16 ENCOUNTER — Ambulatory Visit: Attending: Family Medicine | Admitting: Speech Pathology

## 2023-12-16 ENCOUNTER — Encounter: Payer: Self-pay | Admitting: Speech Pathology

## 2023-12-16 DIAGNOSIS — R498 Other voice and resonance disorders: Secondary | ICD-10-CM | POA: Insufficient documentation

## 2023-12-16 DIAGNOSIS — R41841 Cognitive communication deficit: Secondary | ICD-10-CM | POA: Diagnosis present

## 2023-12-16 DIAGNOSIS — G4733 Obstructive sleep apnea (adult) (pediatric): Secondary | ICD-10-CM | POA: Diagnosis not present

## 2023-12-16 NOTE — Patient Instructions (Addendum)
  Eliminate throat clearing Give your voice a little more power to reduce hoarse    Checkers Chess Connect 4 [employer] games Jig saw puzzles Easy cross words Memory match Board games [employer] Learn a new game!  Folding, microwaving, other chores you can do sitting Pill organizer - you need to double check the day - you remember your meds- use an alarm on your watch to remind you    Listen to and discuss Ted Talks or Podcasts Read and discuss short articles of interest to you- Take notes on these if memory is a challenge Discuss social media posts Look and discuss photo albums  The best activities to improve cognition are functional, real life activities that are important to you:  Plan a menu Participate in household chores and decisions (with supervision) Participate in managing finances Plan a party, trip or tailgate with all of the details (even if you aren't really going to carry it out) Participate in your hobby as you are able with assistance Manage your texts, emails with supervision if needed. Google search for items (even if you're not really going to buy anything) and compare prices and features Socialize -  however, too many visitors can be overwhelming, so set limits My doctor said I should only visit (or talk) for 20 minutes or I do better when I visit with just 1-2 people at a time for 20 minutes    It's good to use real in-person games, not just apps  Apps:  [employer] Elevate There are apps for most of the games listed above

## 2023-12-16 NOTE — Therapy (Signed)
 OUTPATIENT SPEECH LANGUAGE PATHOLOGY EVALUATION   Patient Name: Sarah Miller MRN: [id number] DOB:June 07, 1945, 78 y.o., female Today's Date: 12/16/2023  PCP: Teresa Channel, MD REFERRING PROVIDER: Teresa Channel, MD  END OF SESSION:  End of Session - 12/16/23 1640     Visit Number 1    Number of Visits 12    Date for Recertification  03/09/24    SLP Start Time 1445    SLP Stop Time  1530    SLP Time Calculation (min) 45 min    Activity Tolerance Patient tolerated treatment well          Past Medical History:  Diagnosis Date   Allergic rhinitis    Anxiety    Atrial fibrillation with RVR    CAD (coronary artery disease), native coronary artery    coronary CTA done 12/25/2019 showing coronary calcium score of 60 which was 72nd percentile for age and sex matched controls along with 50 to 70% proximal RCA that was nondominant, less than 25% mid LAD as well as mid left circumflex.   Cerebral ventriculomegaly 12/13/2023   Cervical arthritis    [name], S/P injection with good results.    Chronic diastolic CHF (congestive heart failure)    COPD (chronic obstructive pulmonary disease)    with emphysema     DOE (dyspnea on exertion) 02/03/2019   - PFTs 02/02/2019 unable to do spirometry, mod decreased lung vol with ERV 10% c/w obesity effects      Dyslipidemia    Early menopause    Frequent falls    Frozen shoulder    H/O frozen shoulder and rotator cuff tendonititis, L s/p injection   Gastroesophageal reflux disease without esophagitis    Hardening of the aorta (main artery of the heart) 11/29/2023   History of melanoma    1978 and 1979   Intrinsic sphincter deficiency (ISD) 02/06/2017   Added automatically from request for surgery [id number]     Iron deficiency anemia    Mild neurocognitive disorder 12/13/2023   Morbid obesity 02/03/2019   PFTs 02/02/2019 with erv 10% c/w body habitus     Neutrophilia    Obstructive sleep apnea    Osteopenia    PONV  (postoperative nausea and vomiting)    Pulmonary hypertension    RBBB (right bundle branch block) 03/19/2013   Recurrent major depression in remission    Stage 3 chronic kidney disease    Type 2 diabetes mellitus    Upper airway cough syndrome vs cough variant asthma 04/18/2018   Onset mid sept 2019  - Allergy  profile  02/28/2018    IgE  312  RAST neg     - 04/17/2018 d/c breo/ max rx for gerd/ cyclical cough   - PFTs 02/02/2019 unable to do spirometry, mod decreased lung vol with ERV 10% c/w obesity effects   - 08/05/2019  After extensive coaching inhaler device,  effectiveness =    75% (short Ti)      Urinary incontinence    Vitamin D deficiency    Past Surgical History:  Procedure Laterality Date   ABDOMINAL HYSTERECTOMY     CATARACT EXTRACTION W/ INTRAOCULAR LENS IMPLANT Bilateral    FRACTURE SURGERY Left    wrist   MELANOMA EXCISION     REVERSE SHOULDER ARTHROPLASTY Left 12/28/2019   Procedure: REVERSE SHOULDER ARTHROPLASTY;  Surgeon: [name], MD;  Location: [facility];  Service: Orthopedics;  Laterality: Left;   Patient Active Problem List   Diagnosis Date Noted  Cerebral ventriculomegaly 12/13/2023   Mild neurocognitive disorder 12/13/2023   Urinary incontinence    Frequent falls    Hardening of the aorta (main artery of the heart) 11/29/2023   Allergic rhinitis    Anxiety    Stage 3 chronic kidney disease    Dyslipidemia    Gastroesophageal reflux disease without esophagitis    Iron deficiency anemia    Neutrophilia    Obstructive sleep apnea    Osteopenia    Recurrent major depression in remission    Vitamin D deficiency    Chronic diastolic CHF (congestive heart failure)    Pulmonary hypertension    Type 2 diabetes mellitus    Atrial fibrillation with RVR    COPD (chronic obstructive pulmonary disease)    CAD (coronary artery disease), native coronary artery    DOE (dyspnea on exertion) 02/03/2019   Morbid obesity 02/03/2019   Upper airway cough syndrome vs cough  variant asthma 04/18/2018   Intrinsic sphincter deficiency (ISD) 02/06/2017   RBBB (right bundle branch block) 03/19/2013    ONSET DATE: 11/07/2023 (referral date)  REFERRING DIAG: Memory/attention  THERAPY DIAG:  Cognitive communication deficit - Plan: SLP plan of care cert/re-cert  Other voice and resonance disorders - Plan: SLP plan of care cert/re-cert  Rationale for Evaluation and Treatment: Rehabilitation  SUBJECTIVE:   SUBJECTIVE STATEMENT: It's for my swallowing re: reason for referral Pt accompanied by: family member Granddaughter, [name]  PERTINENT HISTORY: Cognitive changes also started in Spring/Summer 2025. She had been living alone independently up until her admission for atrial fibrillation in June 2025. [name] started noticing she would forget her medications, [name] would text and she would report she forgot them so she took over medications in June. [name] had to move in with her at that time and noticed progressive cognitive decline. She repeats herself, [name] has told her 15 times already where they were going today. She lost her email, forgets her passwords, she can't type anything. She calls things different names, she was supposed to prick her finger to check blood then went on to use the wrong medication. [name] reports focus is a big thing, there is no focus. She is also less inhibited with family, no filter in the past year. She is impulsive. She continues to do well managing finances, most are on autopay, [name] checks behind her. She stopped driving locally within the past year, she denied getting lost, it was getting more difficult to get down to the stairs to her car.  PAIN:  Are you having pain? No  FALLS: Has patient fallen in last 6 months?  Yes, Number of falls: multiple - see PT eval  LIVING ENVIRONMENT: Lives with: Her daughter moved in with her 4 months ago due to physical and cognitive decline Lives in: House/apartment  PLOF:   Level of assistance: Independent with ADLs, Independent with IADLs Employment: Retired  PATIENT GOALS: To live on my own again  OBJECTIVE:  Note: Objective measures were completed at Evaluation unless otherwise noted.  DIAGNOSTIC FINDINGS: IMPRESSION: 1. Age-related atrophy and mild-to-moderate periventricular and deep cerebral white matter disease.  COGNITION: Overall cognitive status: Impaired Areas of impairment:  Attention: Impaired: Selective, Alternating Memory: Impaired: Working Development worker, community Awareness: Impaired: Emergent and Anticipatory Executive function: Impaired: Impulse control, Problem solving, Planning, Error awareness, Self-correction, and Slow processing Functional deficits: forgets meds, bills, adds duplicates to insta-cart  COGNITIVE COMMUNICATION: Following directions: Follows one step commands consistently  Auditory comprehension: WFL Verbal expression: Impaired: word finding -  named 9 animals and 6 m words in 1 minute (15-20 is WNL) Functional communication: WFL  ORAL MOTOR EXAMINATION: Overall status: WFL Comments:   SWALLOWING: Reports occasional sneezing with meals, denies globus sensation, denies coughing or choking with meals  Passed Yale Swallow Protocol  VOICE:  Voice Quality: hoarse, low volume Vocal abuse: consistent throat clearing before each utterance; endorses frequent coughing; endorses history of GERD, is not following reflux precautions or taking reflux meds  Trials of high intensity voice exercises and high intensity flow phrases did clear phonation and improve voice quality significantly   STANDARDIZED ASSESSMENTS: Deferred due to recent neuropsych eval - see chart for test scores. Neuropsych Impression: Clinical Impression(s): Ms. Eaker pattern of performance is suggestive of primary impairments surrounding executive functioning, semantic fluency, visuospatial abilities, and both encoding (i.e., learning) and  delayed retrieval aspects of verbal memory. Further weakness/variability was exhibited across processing speed. Performances were appropriate relative to age-matched peers across attention/concentration, receptive language, phonemic fluency, confrontation naming, delayed retrieval aspects of visual memory, and recognition/consolidation aspects of memory broadly. Functionally, her daughter has become more involved in instrumental instrumental activities of daily living (ADLs) largely due to Ms. Diep's fall risk and overall balance concerns. Overall, given current testing, Ms. Raether best meets diagnostic criteria for a Mild Neurocognitive Disorder (mild cognitive impairment) at the present time.                                                                                                                              TREATMENT DATE:   12/16/23: Evaluation completed - Initiated training in compensations for memory and attention using alarm on smart watch to recall am meds, use calendar to recall appointments and write down important information. Trained in throat clear alternatives - Mei demonstrated use of throat clear alternatives to suppress throat clears 4/8 opportunities after training - she required usual verbal cues to ID throat clearing behavior    PATIENT EDUCATION: Education details: See Treatment, compensations for memory, attention, processing. Throat clear alternatives Person educated: Patient and Child(ren) Education method: Explanation, Demonstration, Verbal cues, and Handouts Education comprehension: verbal cues required and needs further education   GOALS: Goals reviewed with patient? Yes  SHORT TERM GOALS: Target date: 02/12/24  Pt will recall am meds with occasional min A from caregiver over 1 week Baseline: Goal status: INITIAL  2.  Pt will throat clear 3 or less times a session with occasional min A Baseline:  Goal status: INITIAL  3.  Pt will use calendar  to recall appointments with rare reminders from caregiver Baseline:  Goal status: INITIAL  4.  Pt will use compensations for word finding in structured naming tasks with occasional min A 3/5 opportunities Baseline:  Goal status: INITIAL  5.  Pt will use memory note book to recall pertinent information  Baseline:  Goal status: INITIAL  6.  Pt will complete HEP for voice with occasional min A  Baseline:  Goal status:  INITIAL  LONG TERM GOALS: Target date: 03/11/24  Pt will use external aids to recall meds twice daily with occasional min A from caregiver Baseline:  Goal status: INITIAL  2.  Pt will maintain clear phonation over 10 minute conversation with rare min A Baseline:  Goal status: INITIAL  3.  Pt and caregivers will carryover 3 compensatory strategies for slow processing with rare min A Baseline:  Goal status: INITIAL  4.  Pt will follow 3 vocal hygiene strategies and 3 reflux precautions with occasional min A Baseline:  Goal status: INITIAL  5.  Pt will ID and generate 3 strategies for increasing independence at home safely (ID why she can't live alone and find solution to barrier of living along) Baseline:  Goal status: INITIAL   ASSESSMENT:  CLINICAL IMPRESSION: Patient is a 78 y.o. female who was seen today for moderate cognitive communication impairments and dysphonia. Alee is currently seeing neurology for w/u for NPH vs other etiology. She was observed to clear her throat 15+ times throughout evaluation. Volume low in conversation - 64dB average (70-72 is WNL). She notes cognitive difficulties have increased and her daughter moved in with her 4 months ago due to progressive balance difficulties, falls and memory concerns. Her daughter is managing all of Janaisa's meds, finances, food, calendar and appointments. Cadee reports that she is frustrating to her daughter as she asks questions repeatedly and wants to get up by herself. Her daughter works full time from home.  Shanasia is not completing any household tasks. I encouraged her to complete tasks that she can do sitting, such as folding, using the microwave etc. Trial high intensity voice exercise - sustained ah, glide and repeating short flow phrases (h phrases) did result in clear phonation. Will address this in ST, however if no improvement, will request laryngology consult. I recommend skilled ST to maximize intelligibility and cognition for safety, independence and to reduce caregiver burden.   OBJECTIVE IMPAIRMENTS: include attention, memory, awareness, executive functioning, expressive language, and voice disorder. These impairments are limiting patient from managing medications, managing appointments, managing finances, household responsibilities, ADLs/IADLs, and effectively communicating at home and in community. Factors affecting potential to achieve goals and functional outcome are unknown dx.. Patient will benefit from skilled SLP services to address above impairments and improve overall function.  REHAB POTENTIAL: Fair unknown etiology at this time  PLAN:  SLP FREQUENCY: 1-2x/week  SLP DURATION: 12 weeks  PLANNED INTERVENTIONS: Aspiration precaution training, Diet toleration management , Language facilitation, Environmental controls, Cueing hierachy, Cognitive reorganization, Internal/external aids, Functional tasks, Multimodal communication approach, SLP instruction and feedback, Compensatory strategies, Patient/family education, [phone] Treatment of speech (30 or 45 min) , and [id number]- Speech Eval Sound Prod, Artic, Phon, Eval Compre, Express    Joceline Hinchcliff, Leita Caldron, CCC-SLP 12/16/2023, 4:42 PM

## 2023-12-17 ENCOUNTER — Ambulatory Visit: Admitting: Neurosurgery

## 2023-12-17 VITALS — BP 123/78 | HR 80 | Ht 60.0 in | Wt 203.8 lb

## 2023-12-17 DIAGNOSIS — G9389 Other specified disorders of brain: Secondary | ICD-10-CM | POA: Diagnosis not present

## 2023-12-17 NOTE — Progress Notes (Signed)
 Assessment : Discussed the use of AI scribe software for clinical note transcription with the patient, who gave verbal consent to proceed.  History of Present Illness Sarah Miller is a 78 year old female with a history of falls and cognitive changes who presents with balance issues and memory problems. She was referred by Dr. Georjean for evaluation of her balance issues and cognitive changes.  She has been experiencing frequent falls since the spring, with symptoms worsening over the summer. She describes falling backwards and to the side, and notes that she falls every time she lets go of her walker. Despite attending neuro physical therapy throughout the summer, there has been no improvement.  Urinary incontinence has been a long-standing issue, but it has recently become more pronounced. She describes an inability to control her urine, leading to accidents, and sometimes feels the urge to urinate but is unable to do so in time.  Cognitive changes have been noted, particularly with short-term memory. She often forgets recent events and asks the same questions repeatedly. Her caregiver notes that before moving in, her medication adherence was sporadic.  She has a history of atrial fibrillation, for which she takes Eliquis  twice daily. She also has type 2 diabetes, COPD, obstructive sleep apnea, and a history of knee issues for which she has received injections.  She is a former smoker and does not consume alcohol.    Plan : I went over the clinical picture with her and her daughter and this most certainly sounds very much like normal pressure hydrocephalus.  I did not test her gait because she is so unstable that she has high risk of falls but according to the daughter who describe it she can barely put 1 step in front of another.  I reviewed the MRI and this shows ventriculomegaly without any T2 transependymal flow.  I went over the options with them and I told them that the litmus test  for me is the lumbar puncture.  If the lumbar puncture done and high-volume demonstrates an improvement then certainly a ventriculoperitoneal shunt would be a good option.  I talked to him about the possibility of physical therapy evaluation but she says that she has had an extensive evaluation already and was not entirely in favor of doing the pre and postoperative evaluation again.  Therefore, we will rely upon the patient's daughter's evaluation predominantly which is what I hold as the most important factor in assessing the patient's improvement after lumbar puncture.  She is on Eliquis  but our radiology department does not require patients to come off of it.  We briefly talked about the surgery and what it entails and the perioperative phase but we will do so if she truly needs this.  I will see them back after the lumbar puncture has been done and the daughter is going to pay close attention to make changes after the lumbar puncture.   Social History   Socioeconomic History   Marital status: Divorced    Spouse name: Not on file   Number of children: Not on file   Years of education: 12   Highest education level: High school graduate  Occupational History   Occupation: Retired  Tobacco Use   Smoking status: Former    Current packs/day: 0.00    Average packs/day: 0.8 packs/day for 53.0 years (39.8 ttl pk-yrs)    Types: Cigarettes    Start date: 02/27/1963    Quit date: 02/27/2016    Years since quitting: 7.8  Smokeless tobacco: Never  Vaping Use   Vaping status: Never Used  Substance and Sexual Activity   Alcohol use: Not Currently    Comment: rare   Drug use: No   Sexual activity: Not on file    Comment: Hysterectomy  Other Topics Concern   Not on file  Social History Narrative   Living w/ daughter, with cat-1 story, 1 step   Right hand   Caffeine 1 cup coffee daily      Social Drivers of Corporate investment banker Strain: Not on file  Food Insecurity: No Food  Insecurity (07/25/2023)   Hunger Vital Sign    Worried About Running Out of Food in the Last Year: Never true    Ran Out of Food in the Last Year: Never true  Transportation Needs: No Transportation Needs (07/25/2023)   PRAPARE - Administrator, Civil Service (Medical): No    Lack of Transportation (Non-Medical): No  Physical Activity: Not on file  Stress: Not on file  Social Connections: Socially Isolated (07/25/2023)   Social Connection and Isolation Panel    Frequency of Communication with Friends and Family: More than three times a week    Frequency of Social Gatherings with Friends and Family: Three times a week    Attends Religious Services: Never    Active Member of Clubs or Organizations: No    Attends Banker Meetings: Never    Marital Status: Divorced  Catering manager Violence: Not At Risk (07/25/2023)   Humiliation, Afraid, Rape, and Kick questionnaire    Fear of Current or Ex-Partner: No    Emotionally Abused: No    Physically Abused: No    Sexually Abused: No    Family History  Problem Relation Age of Onset   Cancer Mother        ovarian cancer   Hypertension Father    Diabetes Father    Heart attack Father    Cancer Father        testicular   CAD Father    Heart disease Father    Hyperlipidemia Father    Heart attack Brother 91   CAD Brother    Heart disease Brother    CAD Paternal Grandfather    Hypertension Sister    Cancer Other    Hyperlipidemia Other    Heart disease Other     Allergies  Allergen Reactions   Morphine And Codeine Nausea Only   Other     hydocan cough syrup causes nausea   Penicillins Rash    Tolerated Cephalosporin 12/28/2019.     Past Medical History:  Diagnosis Date   Allergic rhinitis    Anxiety    Atrial fibrillation with RVR    CAD (coronary artery disease), native coronary artery    coronary CTA done 12/25/2019 showing coronary calcium score of 60 which was 72nd percentile for age and sex  matched controls along with 50 to 70% proximal RCA that was nondominant, less than 25% mid LAD as well as mid left circumflex.   Cerebral ventriculomegaly 12/13/2023   Cervical arthritis    [name], S/P injection with good results.    Chronic diastolic CHF (congestive heart failure)    COPD (chronic obstructive pulmonary disease)    with emphysema     DOE (dyspnea on exertion) 02/03/2019   - PFTs 02/02/2019 unable to do spirometry, mod decreased lung vol with ERV 10% c/w obesity effects      Dyslipidemia  Early menopause    Frequent falls    Frozen shoulder    H/O frozen shoulder and rotator cuff tendonititis, L s/p injection   Gastroesophageal reflux disease without esophagitis    Hardening of the aorta (main artery of the heart) 11/29/2023   History of melanoma    1978 and 1979   Intrinsic sphincter deficiency (ISD) 02/06/2017   Added automatically from request for surgery [id number]     Iron deficiency anemia    Mild neurocognitive disorder 12/13/2023   Morbid obesity 02/03/2019   PFTs 02/02/2019 with erv 10% c/w body habitus     Neutrophilia    Obstructive sleep apnea    Osteopenia    PONV (postoperative nausea and vomiting)    Pulmonary hypertension    RBBB (right bundle branch block) 03/19/2013   Recurrent major depression in remission    Stage 3 chronic kidney disease    Type 2 diabetes mellitus    Upper airway cough syndrome vs cough variant asthma 04/18/2018   Onset mid sept 2019  - Allergy  profile  02/28/2018    IgE  312  RAST neg     - 04/17/2018 d/c breo/ max rx for gerd/ cyclical cough   - PFTs 02/02/2019 unable to do spirometry, mod decreased lung vol with ERV 10% c/w obesity effects   - 08/05/2019  After extensive coaching inhaler device,  effectiveness =    75% (short Ti)      Urinary incontinence    Vitamin D deficiency     Past Surgical History:  Procedure Laterality Date   ABDOMINAL HYSTERECTOMY     CATARACT EXTRACTION W/ INTRAOCULAR LENS IMPLANT  Bilateral    FRACTURE SURGERY Left    wrist   MELANOMA EXCISION     REVERSE SHOULDER ARTHROPLASTY Left 12/28/2019   Procedure: REVERSE SHOULDER ARTHROPLASTY;  Surgeon: [name], MD;  Location: [facility];  Service: Orthopedics;  Laterality: Left;     Physical Exam     Results for orders placed or performed during the hospital encounter of 09/04/23  MR BRAIN W WO CONTRAST   Narrative   CLINICAL DATA:  Ground level fall.  EXAM: MRI HEAD WITHOUT AND WITH CONTRAST  TECHNIQUE: Multiplanar, multiecho pulse sequences of the brain and surrounding structures were obtained without and with intravenous contrast.  CONTRAST:  10mL GADAVIST  GADOBUTROL  1 MMOL/ML IV SOLN  COMPARISON:  CT of the head dated August 27, 2023.  FINDINGS: Brain: Age-related cerebral and cerebellar volume loss. Mild-to-moderate periventricular deep cerebral white matter disease. No evidence of hemorrhage, mass, cortical infarct or hydrocephalus. There is no restricted diffusion. There is also no abnormal parenchymal or meningeal enhancement.  Vascular: Normal flow voids.  Skull and upper cervical spine: Normal marrow signal. No osseous lesions.  Sinuses/Orbits: Mild mucosal disease within the ethmoid air cells. Status post bilateral lens replacement.  Other: None.  IMPRESSION: 1. Age-related atrophy and mild-to-moderate periventricular and deep cerebral white matter disease.   Electronically Signed   By: Evalene Coho M.D.   On: 09/04/2023 16:36   Results for orders placed or performed during the hospital encounter of 08/27/23  CT HEAD WO CONTRAST   Narrative   CLINICAL DATA:  Polytrauma, blunt; Head trauma, moderate-severe  EXAM: CT HEAD WITHOUT CONTRAST  CT CERVICAL SPINE WITHOUT CONTRAST  TECHNIQUE: Multidetector CT imaging of the head and cervical spine was performed following the standard protocol without intravenous contrast. Multiplanar CT image reconstructions of the cervical  spine were also generated.  RADIATION DOSE  REDUCTION: This exam was performed according to the departmental dose-optimization program which includes automated exposure control, adjustment of the mA and/or kV according to patient size and/or use of iterative reconstruction technique.  COMPARISON:  CT head and CT cervical spine August 14, 2023.  FINDINGS: CT HEAD FINDINGS  Brain: No evidence of acute infarction, hemorrhage, hydrocephalus, extra-axial collection or mass lesion/mass effect. Cerebral atrophy.  Vascular: No hyperdense vessel.  Skull: No acute fracture.  Sinuses/Orbits: Posterior left ethmoid air cell opacification. Right periorbital contusion.  Other: No mastoid effusion.  CT CERVICAL SPINE FINDINGS  Alignment: No substantial sagittal subluxation.  Skull base and vertebrae: No acute fracture. No primary bone lesion or focal pathologic process.  Soft tissues and spinal canal: No prevertebral fluid or swelling. No visible canal hematoma.  Disc levels: Similar multilevel degenerative disc disease. Varying degrees of neural foraminal stenosis, likely greatest on the left at C3-C4.  Upper chest: Visualized lung apices are clear.  IMPRESSION: 1. No evidence of acute intracranial abnormality. 2. No evidence of acute fracture or traumatic malalignment in the cervical spine. 3. Right periorbital contusion.   Electronically Signed   By: Gilmore GORMAN Molt M.D.   On: 08/28/2023 00:00

## 2023-12-18 NOTE — Discharge Instructions (Signed)

## 2023-12-19 ENCOUNTER — Ambulatory Visit
Admission: RE | Admit: 2023-12-19 | Discharge: 2023-12-19 | Disposition: A | Discharge status: Home / Self Care | Source: Ambulatory Visit | Attending: Neurosurgery | Admitting: Neurosurgery

## 2023-12-19 VITALS — BP 147/75 | HR 74

## 2023-12-19 DIAGNOSIS — G9389 Other specified disorders of brain: Secondary | ICD-10-CM

## 2023-12-19 DIAGNOSIS — R296 Repeated falls: Secondary | ICD-10-CM | POA: Diagnosis not present

## 2023-12-19 DIAGNOSIS — G912 (Idiopathic) normal pressure hydrocephalus: Secondary | ICD-10-CM | POA: Diagnosis not present

## 2023-12-19 LAB — CSF CELL COUNT WITH DIFFERENTIAL
RBC Count, CSF: 0 {cells}/uL
TOTAL NUCLEATED CELL: 0 {cells}/uL (ref 0–5)

## 2023-12-20 DIAGNOSIS — I129 Hypertensive chronic kidney disease with stage 1 through stage 4 chronic kidney disease, or unspecified chronic kidney disease: Secondary | ICD-10-CM | POA: Diagnosis not present

## 2023-12-20 DIAGNOSIS — N183 Chronic kidney disease, stage 3 unspecified: Secondary | ICD-10-CM | POA: Diagnosis not present

## 2023-12-20 DIAGNOSIS — E1169 Type 2 diabetes mellitus with other specified complication: Secondary | ICD-10-CM | POA: Diagnosis not present

## 2023-12-20 DIAGNOSIS — I480 Paroxysmal atrial fibrillation: Secondary | ICD-10-CM | POA: Diagnosis not present

## 2023-12-20 DIAGNOSIS — N390 Urinary tract infection, site not specified: Secondary | ICD-10-CM | POA: Diagnosis not present

## 2023-12-20 DIAGNOSIS — R296 Repeated falls: Secondary | ICD-10-CM | POA: Diagnosis not present

## 2023-12-20 DIAGNOSIS — E785 Hyperlipidemia, unspecified: Secondary | ICD-10-CM | POA: Diagnosis not present

## 2023-12-20 DIAGNOSIS — J439 Emphysema, unspecified: Secondary | ICD-10-CM | POA: Diagnosis not present

## 2023-12-20 DIAGNOSIS — G912 (Idiopathic) normal pressure hydrocephalus: Secondary | ICD-10-CM | POA: Diagnosis not present

## 2023-12-24 ENCOUNTER — Encounter: Payer: Self-pay | Admitting: Neurosurgery

## 2023-12-24 ENCOUNTER — Telehealth: Payer: Self-pay

## 2023-12-24 NOTE — Telephone Encounter (Signed)
 Daughter called and reported that her mom developed redness around site of the LP that was done on Thursday.  She had itching of the back and fever of 101 starting on Saturday. These symptoms have subsided but still not getting around properly. She states that her mom is not going to the bathroom as often as she previously did.   She has had facial flushing and body redness that started over the weekend and She noticed a welt on the skin. I asked them to send a picture over Mychart which is attached at the bottom of this note.  She has had history of UTI in the last few weeks. She has had on and off symptoms of UTI such as urinary changes, burning when urinating and confusion. She states her mom is taking a prescription that she got a few weeks ago. Antibiotic (sulfamethoxazole-tmp ds_) 1 tablet twice a day.   I asked that they follow up with PCP or urgent care in regards to her UTI symptoms.  I told her that I would discuss with Dr. Janjua about her symptoms after the lumbar puncture to see if he thought this is a causes for concern.

## 2023-12-25 DIAGNOSIS — N390 Urinary tract infection, site not specified: Secondary | ICD-10-CM | POA: Diagnosis not present

## 2023-12-25 NOTE — Telephone Encounter (Signed)
 Patient family member notified of the below. We will see her on Friday.

## 2023-12-26 ENCOUNTER — Telehealth: Admitting: Psychology

## 2023-12-26 DIAGNOSIS — F0670 Mild neurocognitive disorder due to known physiological condition without behavioral disturbance: Secondary | ICD-10-CM

## 2023-12-26 NOTE — Progress Notes (Signed)
   Neuropsychology Feedback Session Sarah Miller. [facility] Department of Neurology  Reason for Referral:   Sarah Miller is a 78 y.o. right-handed Caucasian female referred by Darice Shivers, M.D., to characterize her current cognitive functioning and assist with diagnostic clarity and treatment planning in the context of subjective cognitive decline.   Feedback:   Sarah Miller completed a comprehensive neuropsychological evaluation on 12/13/2023. Please refer to that encounter for the full report and recommendations. Briefly, results suggested primary impairments surrounding executive functioning, semantic fluency, visuospatial abilities, and both encoding (i.e., learning) and delayed retrieval aspects of verbal memory. Further weakness/variability was exhibited across processing speed. Regarding the cause for ongoing cognitive impairment, I do have concerns surrounding normal pressure hydrocephalus (NPH). Cerebral ventriculomegaly appears prominent by my view of her most recent brain MRI this past July. Clinically, she has described a notable change in her balance and gait, including frequent falls, as well as urinary incontinence ongoing for the past year or so. Current testing patterns favoring frontal-subcortical dysfunction and verbal memory align well with what would be expected with this sort of presentation. Overall, ventriculomegaly with the combination of balance instability, incontinence, and cognitive impairment would represent a fairly classic clinical presentation of NPH. Further work-up surrounding these concerns is strongly recommended.   Sarah Miller was unavailable due to an acute illness and associated fatigue. A brief conversation was held with her daughter to discuss the results. Encouragement was provided to continue engaging with Dr. Janjua surrounding neurosurgical options to address NPH concerns. Sarah Miller daughter was given the opportunity to ask  questions and her questions were answered. She was encouraged to reach out should additional questions arise.     No charges were filed as Sarah Miller was not physically present and our virtual feedback session lasted less than 31 minutes.

## 2023-12-27 ENCOUNTER — Ambulatory Visit: Admitting: Neurosurgery

## 2023-12-27 ENCOUNTER — Encounter: Payer: Self-pay | Admitting: Neurosurgery

## 2023-12-27 VITALS — BP 119/84 | HR 80 | Temp 98.4°F | Ht 60.0 in | Wt 203.2 lb

## 2023-12-27 DIAGNOSIS — G9389 Other specified disorders of brain: Secondary | ICD-10-CM | POA: Diagnosis not present

## 2023-12-27 NOTE — Progress Notes (Signed)
 78 year old lady suspected of having normal pressure hydrocephalus.  I saw her a few weeks ago at which point I recommended a workup with lumbar puncture with a preand post lumbar puncture physical therapy evaluation but given all the therapy she had had already, her daughter declined physical therapy.  Patient had a lumbar puncture and 30 cc was removed.  After this, the daughter felt that she got in and out of the car better and was able to stand up better.  She did not notice any significant changes thereafter.  The patient herself noticed no difference whatsoever.  She had a skin reaction from the prep medium that was used and was started on an antibiotic.  2 days thereafter, she had burning when she urinated and was suspected to have a UTI.  I spent a lot of time talking to them about this.  It was not entirely clear to me whether there was a significant improvement or not and I discussed the following options with them:   The first option would be to do nothing and abstain from a shunt due to the lack of significant improvement.  I explained to them that she would avoid the risk of surgery with this.  The second option would be is to repeat the lumbar puncture at a time when her infection and the UTI were no longer an issue.  I think a UTI can definitely cover up any benefit from a lumbar puncture that may have occurred.  I understand that this is uncomfortable for the patient but it would give us  a clear understanding of whether or not CSF drainage would be helpful.  The last option would be is to proceed with a shunt.  I repeatedly explained to them my reasoning for this.  At 1 point I also talked to them about third ventricular reflex and I explained to the daughter that that is not a diagnosis but a phenomenon that is seen on an MRI and but I do not believe that to be a good diagnostic criterion.  It was challenging to get through the discussion in explaining that the whole process is  geared towards not only making a diagnosis but also to educate the patient on the improvement that can be expected after a lumbar puncture.  If I do not feel that there is a significant enough improvement then I do not recommend doing a shunt.  Just because the patient has ventriculomegaly does not mean that the patient has normal pressure hydrocephalus.  I explained the difference between the two to them in layman's terms. The patient's daughter said that her research points towards a normal pressure hydrocephalus which I fully acknowledge and I suspect that she has that as well.  However without any significant improvement, I cannot with certainty say that a shunt is going to work.  I discussed the risk of some surgery with them of infection, hemorrhage, bowel injury and with her BMI, I definitely would use general surgery for the abdominal catheter placement.  We talked about this repeatedly and in the end I told them that she needs to go home and think about this.  I am happy to put a shunt in but then we have to have a clear understanding that the improvement that can be expected may not be as overwhelming as the desire is.  As long as they are okay with that, I am happy to do it.  They are going to go home and think about this and  let me know how they want to proceed.  I personally spent a total of 45 minutes in the care of the patient today including getting/reviewing separately obtained history, counseling and educating, documenting clinical information in the EHR, independently interpreting results, communicating results, and coordinating care.

## 2024-01-01 ENCOUNTER — Other Ambulatory Visit: Payer: Self-pay

## 2024-01-01 ENCOUNTER — Telehealth: Payer: Self-pay | Admitting: Neurology

## 2024-01-01 DIAGNOSIS — R296 Repeated falls: Secondary | ICD-10-CM

## 2024-01-01 DIAGNOSIS — R4189 Other symptoms and signs involving cognitive functions and awareness: Secondary | ICD-10-CM

## 2024-01-01 NOTE — Telephone Encounter (Signed)
 Referral faxed to  Dr. Gerldine Maizes with [facility] [address], [place], [place] [address] eyw#[phone] fax#[phone],

## 2024-01-01 NOTE — Telephone Encounter (Signed)
 Pt.s daughter would like to be seen by another Neurosurgeon than previously referred too, Dr. Gerldine Maizes with [facility] [address], [place], [place] [address] eyw#[phone] fax#[phone], office rep Sierra awaiting referral ASAP for availability

## 2024-01-01 NOTE — Telephone Encounter (Signed)
 Pt daughter called she stated that they want a 2nd opinion sent to  Dr. Gerldine Maizes with [facility] [address], [place], [place] [address] eyw#[phone] fax#[phone],

## 2024-01-09 ENCOUNTER — Other Ambulatory Visit: Payer: Self-pay | Admitting: Neurosurgery

## 2024-01-09 ENCOUNTER — Telehealth (HOSPITAL_BASED_OUTPATIENT_CLINIC_OR_DEPARTMENT_OTHER): Payer: Self-pay | Admitting: *Deleted

## 2024-01-09 NOTE — Telephone Encounter (Signed)
   Pre-operative Risk Assessment    Patient Name: Sarah Miller  DOB: 18-Sep-1945 MRN: [id number]   Date of last office visit: 11/15/23 DR. TURNER Date of next office visit: NONE   Request for Surgical Clearance    Procedure:  VP SHUNT PLACEMENT   Date of Surgery:  Clearance 01/16/24                                Surgeon:  DR. GERLDINE MAIZES Surgeon's Group or Practice Name:  [facility] Phone number:  [phone] EXT [phone] Fax number:  [phone]   Type of Clearance Requested:   - Medical  - Pharmacy:  Hold Apixaban  (Eliquis )     Type of Anesthesia:  General    Additional requests/questions:    Bonney Niels Jest   01/09/2024, 12:45 PM

## 2024-01-10 NOTE — Telephone Encounter (Signed)
 Patient with diagnosis of afib on Eliquis  for anticoagulation.    Procedure: VP SHUNT PLACEMENT  Date of procedure: 02/15/24   CHA2DS2-VASc Score = 7   This indicates a 11.2% annual risk of stroke. The patient's score is based upon: CHF History: 1 HTN History: 1 Diabetes History: 1 Stroke History: 0 Vascular Disease History: 1 Age Score: 2 Gender Score: 1      CrCl 44 ml/min Platelet count 423  Patient has not had an Afib/aflutter ablation in the last 3 months, DCCV within the last 4 weeks or a watchman implanted in the last 45 days   Per office protocol, patient can hold Eliquis  for 2 days prior to procedure.    **This guidance is not considered finalized until pre-operative APP has relayed final recommendations.**

## 2024-01-10 NOTE — Progress Notes (Signed)
 Surgical Instructions   Your procedure is scheduled on Thursday, November 20th. Report to [facility] Main Entrance A at 11:00 A.M., then check in with the Admitting office. Any questions or running late day of surgery: call [phone]  Questions prior to your surgery date: call [phone], Monday-Friday, 8am-4pm. If you experience any cold or flu symptoms such as cough, fever, chills, shortness of breath, etc. between now and your scheduled surgery, please notify us  at the above number.     Remember:  Do not eat or drink after midnight the night before your surgery. This includes no gum, mints, or hard candy.   Take these medicines the morning of surgery with A SIP OF WATER   buPROPion  (WELLBUTRIN  XL)  FLUoxetine (PROZAC)   May take these medicines IF NEEDED: albuterol  (VENTOLIN  HFA) inhaler - bring with you on day of surgery  ondansetron  (ZOFRAN )   Per your physician's instruction, HOLD your apixaban  (ELIQUIS ) for 3 day's prior to surgery.  Last dose on Sunday, Nov. 16th.  One week prior to surgery, STOP taking any Aspirin  (unless otherwise instructed by your surgeon) Aleve, Naproxen, Ibuprofen, Motrin, Advil, Goody's, BC's, all herbal medications, fish oil, and non-prescription vitamins.  WHAT DO I DO ABOUT MY DIABETES MEDICATION?   Do not take oral diabetes medicines metFORMIN (GLUCOPHAGE-XR) the morning of surgery.  THE NIGHT BEFORE SURGERY, take 11.5 units (50%) of Insulin  Glargine (BASAGLAR ) insulin .      Per your physician's instruction, HOLD your MOUNJARO as of today.   HOW TO MANAGE YOUR DIABETES BEFORE AND AFTER SURGERY  Why is it important to control my blood sugar before and after surgery? Improving blood sugar levels before and after surgery helps healing and can limit problems. A way of improving blood sugar control is eating a healthy diet by:  Eating less sugar and carbohydrates  Increasing activity/exercise  Talking with your doctor about reaching  your blood sugar goals High blood sugars (greater than 180 mg/dL) can raise your risk of infections and slow your recovery, so you will need to focus on controlling your diabetes during the weeks before surgery. Make sure that the doctor who takes care of your diabetes knows about your planned surgery including the date and location.  How do I manage my blood sugar before surgery? Check your blood sugar at least 4 times a day, starting 2 days before surgery, to make sure that the level is not too high or low.  Check your blood sugar the morning of your surgery when you wake up and every 2 hours until you get to the Short Stay unit.  If your blood sugar is less than 70 mg/dL, you will need to treat for low blood sugar: Do not take insulin . Treat a low blood sugar (less than 70 mg/dL) with  cup of clear juice (cranberry or apple), 4 glucose tablets, OR glucose gel. Recheck blood sugar in 15 minutes after treatment (to make sure it is greater than 70 mg/dL). If your blood sugar is not greater than 70 mg/dL on recheck, call [phone] for further instructions. Report your blood sugar to the short stay nurse when you get to Short Stay.  If you are admitted to the hospital after surgery: Your blood sugar will be checked by the staff and you will probably be given insulin  after surgery (instead of oral diabetes medicines) to make sure you have good blood sugar levels. The goal for blood sugar control after surgery is 80-180 mg/dL.  Do NOT Smoke (Tobacco/Vaping) for 24 hours prior to your procedure.  If you use a CPAP at night, you may bring your mask/headgear for your overnight stay.   You will be asked to remove any contacts, glasses, piercing's, hearing aid's, dentures/partials prior to surgery. Please bring cases for these items if needed.    Patients discharged the day of surgery will not be allowed to drive home, and someone needs to stay with them for 24  hours.  SURGICAL WAITING ROOM VISITATION Patients may have no more than 2 support people in the waiting area - these visitors may rotate.   Pre-op nurse will coordinate an appropriate time for 1 ADULT support person, who may not rotate, to accompany patient in pre-op.  Children under the age of 89 must have an adult with them who is not the patient and must remain in the main waiting area with an adult.  If the patient needs to stay at the hospital during part of their recovery, the visitor guidelines for inpatient rooms apply.  Please refer to the [facility] website for the visitor guidelines for any additional information.   If you received a COVID test during your pre-op visit  it is requested that you wear a mask when out in public, stay away from anyone that may not be feeling well and notify your surgeon if you develop symptoms. If you have been in contact with anyone that has tested positive in the last 10 days please notify you surgeon.      Pre-operative CHG Bathing Instructions   You can play a key role in reducing the risk of infection after surgery. Your skin needs to be as free of germs as possible. You can reduce the number of germs on your skin by washing with CHG (chlorhexidine  gluconate) soap before surgery. CHG is an antiseptic soap that kills germs and continues to kill germs even after washing.   DO NOT use if you have an allergy  to chlorhexidine /CHG or antibacterial soaps. If your skin becomes reddened or irritated, stop using the CHG and notify one of our RNs at [phone].              TAKE A SHOWER THE NIGHT BEFORE SURGERY   Please keep in mind the following:  DO NOT shave, including legs and underarms, 48 hours prior to surgery.   You may shave your face before/day of surgery.  Place clean sheets on your bed the night before surgery Use a clean washcloth (not used since being washed) for shower. DO NOT sleep with pet's night before surgery.  CHG Shower  Instructions:  Wash your face and private area with normal soap. If you choose to wash your hair, wash first with your normal shampoo.  After you use shampoo/soap, rinse your hair and body thoroughly to remove shampoo/soap residue.  Turn the water  OFF and apply half the bottle of CHG soap to a CLEAN washcloth.  Apply CHG soap ONLY FROM YOUR NECK DOWN TO YOUR TOES (washing for 3-5 minutes)  DO NOT use CHG soap on face, private areas, open wounds, or sores.  Pay special attention to the area where your surgery is being performed.  If you are having back surgery, having someone wash your back for you may be helpful. Wait 2 minutes after CHG soap is applied, then you may rinse off the CHG soap.  Pat dry with a clean towel  Put on clean pajamas    Additional instructions for the day  of surgery: If you choose, you may shower the morning of surgery with an antibacterial soap.  DO NOT APPLY any lotions, deodorants, cologne, or perfumes.   Do not wear jewelry or makeup Do not wear nail polish, gel polish, artificial nails, or any other type of covering on natural nails (fingers and toes) Do not bring valuables to the hospital. [facility] is not responsible for valuables/personal belongings. Put on clean/comfortable clothes.  Please brush your teeth.  Ask your nurse before applying any prescription medications to the skin.

## 2024-01-10 NOTE — Telephone Encounter (Signed)
 Hi Dr. Shlomo. You recently saw this patient in clinic on 11/15/2023 at which time it sounds like she was doing well.  Are you able to comment on surgical clearance for upcoming VP shunt placement scheduled for 01/16/2024? Please route your response to P CV DIV PREOP. Thank you!  ~Bellami Farrelly

## 2024-01-10 NOTE — Telephone Encounter (Signed)
Pharmacy, can you please comment on how long Eliquis can be held for upcoming procedure?  Thank you! 

## 2024-01-11 ENCOUNTER — Encounter: Payer: Self-pay | Admitting: Cardiology

## 2024-01-13 ENCOUNTER — Other Ambulatory Visit: Payer: Self-pay

## 2024-01-13 ENCOUNTER — Encounter (HOSPITAL_COMMUNITY): Payer: Self-pay

## 2024-01-13 ENCOUNTER — Encounter (HOSPITAL_COMMUNITY)
Admission: RE | Admit: 2024-01-13 | Discharge: 2024-01-13 | Disposition: A | Discharge status: Home / Self Care | Source: Ambulatory Visit | Attending: Neurosurgery | Admitting: Neurosurgery

## 2024-01-13 VITALS — BP 116/60 | HR 82 | Temp 97.9°F | Resp 18 | Ht 60.0 in | Wt 206.0 lb

## 2024-01-13 DIAGNOSIS — Z7985 Long-term (current) use of injectable non-insulin antidiabetic drugs: Secondary | ICD-10-CM | POA: Insufficient documentation

## 2024-01-13 DIAGNOSIS — Z7901 Long term (current) use of anticoagulants: Secondary | ICD-10-CM | POA: Insufficient documentation

## 2024-01-13 DIAGNOSIS — Z01812 Encounter for preprocedural laboratory examination: Secondary | ICD-10-CM | POA: Insufficient documentation

## 2024-01-13 DIAGNOSIS — N183 Chronic kidney disease, stage 3 unspecified: Secondary | ICD-10-CM | POA: Insufficient documentation

## 2024-01-13 DIAGNOSIS — E119 Type 2 diabetes mellitus without complications: Secondary | ICD-10-CM

## 2024-01-13 DIAGNOSIS — I2510 Atherosclerotic heart disease of native coronary artery without angina pectoris: Secondary | ICD-10-CM | POA: Insufficient documentation

## 2024-01-13 DIAGNOSIS — I4891 Unspecified atrial fibrillation: Secondary | ICD-10-CM | POA: Insufficient documentation

## 2024-01-13 DIAGNOSIS — G4733 Obstructive sleep apnea (adult) (pediatric): Secondary | ICD-10-CM | POA: Insufficient documentation

## 2024-01-13 DIAGNOSIS — E1122 Type 2 diabetes mellitus with diabetic chronic kidney disease: Secondary | ICD-10-CM | POA: Insufficient documentation

## 2024-01-13 DIAGNOSIS — Z79899 Other long term (current) drug therapy: Secondary | ICD-10-CM | POA: Insufficient documentation

## 2024-01-13 DIAGNOSIS — Z01818 Encounter for other preprocedural examination: Secondary | ICD-10-CM

## 2024-01-13 DIAGNOSIS — Z87891 Personal history of nicotine dependence: Secondary | ICD-10-CM | POA: Insufficient documentation

## 2024-01-13 DIAGNOSIS — J439 Emphysema, unspecified: Secondary | ICD-10-CM | POA: Insufficient documentation

## 2024-01-13 LAB — BASIC METABOLIC PANEL WITH GFR
Anion gap: 12 (ref 5–15)
BUN: 18 mg/dL (ref 8–23)
CO2: 27 mmol/L (ref 22–32)
Calcium: 8.9 mg/dL (ref 8.9–10.3)
Chloride: 102 mmol/L (ref 98–111)
Creatinine, Ser: 0.94 mg/dL (ref 0.44–1.00)
GFR, Estimated: >60 mL/min (ref >60)
Glucose, Bld: 111 mg/dL — ABNORMAL HIGH (ref 70–99)
Potassium: 3.9 mmol/L (ref 3.5–5.1)
Sodium: 141 mmol/L (ref 135–145)

## 2024-01-13 LAB — HEMOGLOBIN A1C
Hgb A1c MFr Bld: 5.6 % (ref 4.8–5.6)
Mean Plasma Glucose: 114.02 mg/dL

## 2024-01-13 LAB — CBC
HCT: 37.5 % (ref 36.0–46.0)
Hemoglobin: 11.5 g/dL — ABNORMAL LOW (ref 12.0–15.0)
MCH: 25.8 pg — ABNORMAL LOW (ref 26.0–34.0)
MCHC: 30.7 g/dL (ref 30.0–36.0)
MCV: 84.3 fL (ref 80.0–100.0)
Platelets: 362 K/uL (ref 150–400)
RBC: 4.45 MIL/uL (ref 3.87–5.11)
RDW: 16.2 % — ABNORMAL HIGH (ref 11.5–15.5)
WBC: 10.4 K/uL (ref 4.0–10.5)
nRBC: 0 % (ref 0.0–0.2)

## 2024-01-13 LAB — GLUCOSE, CAPILLARY: Glucose-Capillary: 99 mg/dL (ref 70–99)

## 2024-01-13 NOTE — Progress Notes (Signed)
 PCP - Dr. Montie Pizza Cardiologist - Dr. Wilbert Bihari  PPM/ICD - denies   Chest x-ray - 08/27/23 EKG - 08/27/23 Stress Test - 04/20/13 ECHO - 07/26/23 Cardiac Cath - denies  Sleep Study - OSA+ CPAP - nightly, pressure setting 4  Fasting Blood Sugar - 90-100 Checks Blood Sugar once a day  Last dose of GLP1 agonist-  Mounjaro last dose 11/12   Blood Thinner Instructions: HOLD Eliquis  3 days. Last dose 11/16 Aspirin  Instructions: n/a  ERAS Protcol - no, NPO   COVID TEST- n/a   Anesthesia review: yes, cardiac hx  Patient denies shortness of breath, fever, cough and chest pain at PAT appointment   All instructions explained to the patient, with a verbal understanding of the material. Patient agrees to go over the instructions while at home for a better understanding. The opportunity to ask questions was provided.

## 2024-01-13 NOTE — Telephone Encounter (Signed)
     Primary Cardiologist: Wilbert Bihari, MD  Chart reviewed as part of pre-operative protocol coverage. Given past medical history and time since last visit, based on ACC/AHA guidelines, HERMINA BARNARD would be at acceptable risk for the planned procedure without further cardiovascular testing.   Her RCRI is high risk, greater than 11% risk of major cardiac event.  Patient with diagnosis of afib on Eliquis  for anticoagulation.     Procedure: VP SHUNT PLACEMENT  Date of procedure: 02/15/24     CHA2DS2-VASc Score = 7   This indicates a 11.2% annual risk of stroke. The patient's score is based upon: CHF History: 1 HTN History: 1 Diabetes History: 1 Stroke History: 0 Vascular Disease History: 1 Age Score: 2 Gender Score: 1       CrCl 44 ml/min Platelet count 423   Patient has not had an Afib/aflutter ablation in the last 3 months, DCCV within the last 4 weeks or a watchman implanted in the last 45 days    Per office protocol, patient can hold Eliquis  for 2 days prior to procedure.  I will route this recommendation to the requesting party via Epic fax function and remove from pre-op pool.  Please call with questions.  Josefa HERO. Knut Rondinelli NP-C     01/13/2024, 8:42 AM [facility] [address] [place], [place] [address] Office [phone]

## 2024-01-13 NOTE — Progress Notes (Signed)
 Surgical Instructions   Your procedure is scheduled on Thursday, November 20th. Report to [facility] Main Entrance A at 11:00 A.M., then check in with the Admitting office. Any questions or running late day of surgery: call [phone]  Questions prior to your surgery date: call [phone], Monday-Friday, 8am-4pm. If you experience any cold or flu symptoms such as cough, fever, chills, shortness of breath, etc. between now and your scheduled surgery, please notify us  at the above number.     Remember:  Do not eat or drink after midnight the night before your surgery. This includes no gum, mints, or hard candy.   Take these medicines the morning of surgery with A SIP OF WATER   buPROPion  (WELLBUTRIN  XL)  FLUoxetine (PROZAC)  cephALEXin (KEFLEX)   May take these medicines IF NEEDED: albuterol  (VENTOLIN  HFA) inhaler - bring with you on day of surgery  ondansetron  (ZOFRAN )   Per your physician's instruction, HOLD your apixaban  (ELIQUIS ) for 3 day's prior to surgery.  Last dose on Sunday, Nov. 16th.  One week prior to surgery, STOP taking any Aspirin  (unless otherwise instructed by your surgeon) Aleve, Naproxen, Ibuprofen, Motrin, Advil, Goody's, BC's, all herbal medications, fish oil, and non-prescription vitamins.  WHAT DO I DO ABOUT MY DIABETES MEDICATION?   Do not take oral diabetes medicines metFORMIN (GLUCOPHAGE-XR) the morning of surgery.  THE NIGHT BEFORE SURGERY, take 11.5 units (50%) of Insulin  Glargine (BASAGLAR ) insulin .      STOP taking Mounjaro 7 days prior to surgery. Last dose 11/12.   HOW TO MANAGE YOUR DIABETES BEFORE AND AFTER SURGERY  Why is it important to control my blood sugar before and after surgery? Improving blood sugar levels before and after surgery helps healing and can limit problems. A way of improving blood sugar control is eating a healthy diet by:  Eating less sugar and carbohydrates  Increasing activity/exercise  Talking with your doctor  about reaching your blood sugar goals High blood sugars (greater than 180 mg/dL) can raise your risk of infections and slow your recovery, so you will need to focus on controlling your diabetes during the weeks before surgery. Make sure that the doctor who takes care of your diabetes knows about your planned surgery including the date and location.  How do I manage my blood sugar before surgery? Check your blood sugar at least 4 times a day, starting 2 days before surgery, to make sure that the level is not too high or low.  Check your blood sugar the morning of your surgery when you wake up and every 2 hours until you get to the Short Stay unit.  If your blood sugar is less than 70 mg/dL, you will need to treat for low blood sugar: Do not take insulin . Treat a low blood sugar (less than 70 mg/dL) with  cup of clear juice (cranberry or apple), 4 glucose tablets, OR glucose gel. Recheck blood sugar in 15 minutes after treatment (to make sure it is greater than 70 mg/dL). If your blood sugar is not greater than 70 mg/dL on recheck, call [phone] for further instructions. Report your blood sugar to the short stay nurse when you get to Short Stay.  If you are admitted to the hospital after surgery: Your blood sugar will be checked by the staff and you will probably be given insulin  after surgery (instead of oral diabetes medicines) to make sure you have good blood sugar levels. The goal for blood sugar control after surgery is 80-180 mg/dL.  Do NOT Smoke (Tobacco/Vaping) for 24 hours prior to your procedure.  If you use a CPAP at night, you may bring your mask/headgear for your overnight stay.   You will be asked to remove any contacts, glasses, piercing's, hearing aid's, dentures/partials prior to surgery. Please bring cases for these items if needed.    Patients discharged the day of surgery will not be allowed to drive home, and someone needs to stay with them for  24 hours.  SURGICAL WAITING ROOM VISITATION Patients may have no more than 2 support people in the waiting area - these visitors may rotate.   Pre-op nurse will coordinate an appropriate time for 1 ADULT support person, who may not rotate, to accompany patient in pre-op.  Children under the age of 32 must have an adult with them who is not the patient and must remain in the main waiting area with an adult.  If the patient needs to stay at the hospital during part of their recovery, the visitor guidelines for inpatient rooms apply.  Please refer to the [facility] website for the visitor guidelines for any additional information.   If you received a COVID test during your pre-op visit  it is requested that you wear a mask when out in public, stay away from anyone that may not be feeling well and notify your surgeon if you develop symptoms. If you have been in contact with anyone that has tested positive in the last 10 days please notify you surgeon.      Pre-operative CHG Bathing Instructions   You can play a key role in reducing the risk of infection after surgery. Your skin needs to be as free of germs as possible. You can reduce the number of germs on your skin by washing with CHG (chlorhexidine  gluconate) soap before surgery. CHG is an antiseptic soap that kills germs and continues to kill germs even after washing.   DO NOT use if you have an allergy  to chlorhexidine /CHG or antibacterial soaps. If your skin becomes reddened or irritated, stop using the CHG and notify one of our RNs at [phone].              TAKE A SHOWER THE NIGHT BEFORE SURGERY   Please keep in mind the following:  DO NOT shave, including legs and underarms, 48 hours prior to surgery.   You may shave your face before/day of surgery.  Place clean sheets on your bed the night before surgery Use a clean washcloth (not used since being washed) for shower. DO NOT sleep with pet's night before surgery.  CHG Shower  Instructions:  Wash your face and private area with normal soap. If you choose to wash your hair, wash first with your normal shampoo.  After you use shampoo/soap, rinse your hair and body thoroughly to remove shampoo/soap residue.  Turn the water  OFF and apply half the bottle of CHG soap to a CLEAN washcloth.  Apply CHG soap ONLY FROM YOUR NECK DOWN TO YOUR TOES (washing for 3-5 minutes)  DO NOT use CHG soap on face, private areas, open wounds, or sores.  Pay special attention to the area where your surgery is being performed.  If you are having back surgery, having someone wash your back for you may be helpful. Wait 2 minutes after CHG soap is applied, then you may rinse off the CHG soap.  Pat dry with a clean towel  Put on clean pajamas    Additional instructions for the day  of surgery: If you choose, you may shower the morning of surgery with an antibacterial soap.  DO NOT APPLY any lotions, deodorants, cologne, or perfumes.   Do not wear jewelry or makeup Do not wear nail polish, gel polish, artificial nails, or any other type of covering on natural nails (fingers and toes) Do not bring valuables to the hospital. [facility] is not responsible for valuables/personal belongings. Put on clean/comfortable clothes.  Please brush your teeth.  Ask your nurse before applying any prescription medications to the skin.

## 2024-01-14 NOTE — Progress Notes (Signed)
 Anesthesia Chart Review:  78 year old female follows with cardiology for history of atrial fibrillation on Eliquis , OSA on CPAP, elevated coronary calcium score.  Presented 07/16/2023 with A-fib with RVR. Started amiodarone  and Eliquis . Echo 06/2023 showed LVEF 60 to 65%, grade 1 DD, mild RV dysfunction, moderate pulmonary hypertension PASP 55 mmHg, moderate TR. when seen in follow-up by Dr. Shlomo on 11/15/2023 she was noted to be maintaining sinus rhythm.  Preop clearance per telephone encounter 12/13/2023 by Josefa Beauvais, NP, Chart reviewed as part of pre-operative protocol coverage. Given past medical history and time since last visit, based on ACC/AHA guidelines, Sarah Miller would be at acceptable risk for the planned procedure without further cardiovascular testing. Her RCRI is high risk, greater than 11% risk of major cardiac event.SABRASABRAPer office protocol, patient can hold Eliquis  for 2 days prior to procedure.  Other pertinent history includes former smoker (40 pack years, quit 2018) with associated COPD/emphysema, PONV, CKD 3, IDDM2.  Patient reports last dose of Mounjaro 01/08/2024.  Last dose of Eliquis  01/12/2024.  History of difficult airway.  Per anesthesia intubation note 12/28/2019, Comments: IV induction Leonce-- intubation AM CRNA -- DL with pt moving-- blade removed and Sevo on-- OA inserted and assisted ventilation-- OA removed and 2nd DL-- AW poorly visualized edema and excess tissue - consider Glidescope in future-- head and neck neutral-- bruising on left side of face and chest from fall-- bilat BS Jackson.  Mask ventilation noted to be without difficulty.  Preop labs reviewed, mild anemia seen with 11.5, otherwise unremarkable.  DM2 well-controlled with A1c 5.6.  EKG 08/27/2023: Atrial fibrillation.  Rate 118.  Right bundle branch block.  TTE 07/26/2023: 1. Left ventricular ejection fraction, by estimation, is 60 to 65%. The  left ventricle has normal function. The left  ventricle has no regional  wall motion abnormalities. Left ventricular diastolic parameters are  consistent with Grade I diastolic  dysfunction (impaired relaxation).   2. Right ventricular systolic function is mildly reduced. The right  ventricular size is normal. There is moderately elevated pulmonary artery  systolic pressure. The estimated right ventricular systolic pressure is  55.3 mmHg.   3. The mitral valve is normal in structure. No evidence of mitral valve  regurgitation. No evidence of mitral stenosis.   4. Tricuspid valve regurgitation is moderate.   5. The aortic valve is normal in structure. Aortic valve regurgitation is  not visualized. No aortic stenosis is present.   6. The inferior vena cava is dilated in size with >50% respiratory  variability, suggesting right atrial pressure of 8 mmHg.   Coronary CTA 12/25/2019:  IMPRESSION: 1. Coronary calcium score of 60. This was 72nd percentile for age and sex matched control.   2. Normal coronary origin with left dominance.   3. CAD-RADS 3; one lesion with moderate stenosis. Consider symptom-guided anti-ischemic pharmacotherapy as well as risk factor modification per guideline directed care. Additional analysis with CT FFR will be submitted.   4.  Atypical pulmonary vein drainage as above.   5.  Aortic Atherosclerosis noted.     Lynwood Geofm RIGGERS [facility] Short Stay Center/Anesthesiology Phone [phone] 01/14/2024 3:50 PM

## 2024-01-14 NOTE — Anesthesia Preprocedure Evaluation (Signed)
 Anesthesia Evaluation  Patient identified by MRN, date of birth, ID band Patient awake    Reviewed: Allergy  & Precautions, H&P , NPO status , Patient's Chart, lab work & pertinent test results  History of Anesthesia Complications (+) PONV and history of anesthetic complications  Airway Mallampati: II   Neck ROM: full    Dental   Pulmonary sleep apnea , COPD, former smoker   breath sounds clear to auscultation       Cardiovascular + CAD and +CHF  + dysrhythmias Atrial Fibrillation  Rhythm:regular Rate:Normal     Neuro/Psych  PSYCHIATRIC DISORDERS Anxiety Depression    hydrocephalus    GI/Hepatic ,GERD  ,,  Endo/Other  diabetes, Type 2  Class 3 obesity  Renal/GU      Musculoskeletal  (+) Arthritis ,    Abdominal   Peds  Hematology   Anesthesia Other Findings   Reproductive/Obstetrics                              Anesthesia Physical Anesthesia Plan  ASA: 3  Anesthesia Plan: General   Post-op Pain Management:    Induction: Intravenous  PONV Risk Score and Plan: 4 or greater and Ondansetron , Dexamethasone  and Treatment may vary due to age or medical condition  Airway Management Planned: Oral ETT  Additional Equipment:   Intra-op Plan:   Post-operative Plan: Extubation in OR  Informed Consent: I have reviewed the patients History and Physical, chart, labs and discussed the procedure including the risks, benefits and alternatives for the proposed anesthesia with the patient or authorized representative who has indicated his/her understanding and acceptance.     Dental advisory given  Plan Discussed with: CRNA, Anesthesiologist and Surgeon  Anesthesia Plan Comments: (PAT note by Lynwood Hope, PA-C: 78 year old female follows with cardiology for history of atrial fibrillation on Eliquis , OSA on CPAP, elevated coronary calcium score.  Presented 07/16/2023 with A-fib with RVR.  Started amiodarone  and Eliquis . Echo 06/2023 showed LVEF 60 to 65%, grade 1 DD, mild RV dysfunction, moderate pulmonary hypertension PASP 55 mmHg, moderate TR. when seen in follow-up by Dr. Shlomo on 11/15/2023 she was noted to be maintaining sinus rhythm.  Preop clearance per telephone encounter 12/13/2023 by Josefa Beauvais, NP, Chart reviewed as part of pre-operative protocol coverage. Given past medical history and time since last visit, based on ACC/AHA guidelines, SABRYNA LAHM would be at acceptable risk for the planned procedure without further cardiovascular testing. Her RCRI is high risk, greater than 11% risk of major cardiac event.SABRASABRAPer office protocol, patient can hold Eliquis  for 2 days prior to procedure.  Other pertinent history includes former smoker (40 pack years, quit 2018) with associated COPD/emphysema, PONV, CKD 3, IDDM2.  Patient reports last dose of Mounjaro 01/08/2024.  Last dose of Eliquis  01/12/2024.  History of difficult airway.  Per anesthesia intubation note 12/28/2019, Comments: IV induction Leonce-- intubation AM CRNA -- DL with pt moving-- blade removed and Sevo on-- OA inserted and assisted ventilation-- OA removed and 2nd DL-- AW poorly visualized edema and excess tissue - consider Glidescope in future-- head and neck neutral-- bruising on left side of face and chest from fall-- bilat BS Jackson.  Mask ventilation noted to be without difficulty.  Preop labs reviewed, mild anemia seen with 11.5, otherwise unremarkable.  DM2 well-controlled with A1c 5.6.  EKG 08/27/2023: Atrial fibrillation.  Rate 118.  Right bundle branch block.  TTE 07/26/2023: 1. Left ventricular ejection fraction, by  estimation, is 60 to 65%. The  left ventricle has normal function. The left ventricle has no regional  wall motion abnormalities. Left ventricular diastolic parameters are  consistent with Grade I diastolic  dysfunction (impaired relaxation).  2. Right ventricular systolic function  is mildly reduced. The right  ventricular size is normal. There is moderately elevated pulmonary artery  systolic pressure. The estimated right ventricular systolic pressure is  55.3 mmHg.  3. The mitral valve is normal in structure. No evidence of mitral valve  regurgitation. No evidence of mitral stenosis.  4. Tricuspid valve regurgitation is moderate.  5. The aortic valve is normal in structure. Aortic valve regurgitation is  not visualized. No aortic stenosis is present.  6. The inferior vena cava is dilated in size with >50% respiratory  variability, suggesting right atrial pressure of 8 mmHg.   Coronary CTA 12/25/2019:  IMPRESSION: 1. Coronary calcium score of 60. This was 72nd percentile for age and sex matched control.  2. Normal coronary origin with left dominance.  3. CAD-RADS 3; one lesion with moderate stenosis. Consider symptom-guided anti-ischemic pharmacotherapy as well as risk factor modification per guideline directed care. Additional analysis with CT FFR will be submitted.  4.  Atypical pulmonary vein drainage as above.  5.  Aortic Atherosclerosis noted.    )         Anesthesia Quick Evaluation

## 2024-01-16 ENCOUNTER — Inpatient Hospital Stay (HOSPITAL_COMMUNITY)

## 2024-01-16 ENCOUNTER — Encounter (HOSPITAL_COMMUNITY): Payer: Self-pay | Admitting: Neurosurgery

## 2024-01-16 ENCOUNTER — Inpatient Hospital Stay (HOSPITAL_COMMUNITY): Admitting: Physician Assistant

## 2024-01-16 ENCOUNTER — Inpatient Hospital Stay (HOSPITAL_COMMUNITY)
Admission: RE | Admit: 2024-01-16 | Discharge: 2024-01-21 | DRG: 032 | Disposition: A | Discharge status: Skilled Nursing Facility | Attending: Neurosurgery | Admitting: Neurosurgery

## 2024-01-16 ENCOUNTER — Other Ambulatory Visit: Payer: Self-pay

## 2024-01-16 ENCOUNTER — Encounter (HOSPITAL_COMMUNITY)
Admission: RE | Disposition: A | Discharge status: Skilled Nursing Facility | Payer: Self-pay | Source: Home / Self Care | Attending: Neurosurgery

## 2024-01-16 DIAGNOSIS — Z96612 Presence of left artificial shoulder joint: Secondary | ICD-10-CM | POA: Diagnosis present

## 2024-01-16 DIAGNOSIS — K219 Gastro-esophageal reflux disease without esophagitis: Secondary | ICD-10-CM | POA: Diagnosis present

## 2024-01-16 DIAGNOSIS — Z79899 Other long term (current) drug therapy: Secondary | ICD-10-CM

## 2024-01-16 DIAGNOSIS — E785 Hyperlipidemia, unspecified: Secondary | ICD-10-CM | POA: Diagnosis present

## 2024-01-16 DIAGNOSIS — K420 Umbilical hernia with obstruction, without gangrene: Secondary | ICD-10-CM | POA: Diagnosis present

## 2024-01-16 DIAGNOSIS — Z85820 Personal history of malignant melanoma of skin: Secondary | ICD-10-CM | POA: Diagnosis not present

## 2024-01-16 DIAGNOSIS — Z794 Long term (current) use of insulin: Secondary | ICD-10-CM | POA: Diagnosis not present

## 2024-01-16 DIAGNOSIS — E119 Type 2 diabetes mellitus without complications: Secondary | ICD-10-CM

## 2024-01-16 DIAGNOSIS — Z7984 Long term (current) use of oral hypoglycemic drugs: Secondary | ICD-10-CM | POA: Diagnosis not present

## 2024-01-16 DIAGNOSIS — E559 Vitamin D deficiency, unspecified: Secondary | ICD-10-CM | POA: Diagnosis present

## 2024-01-16 DIAGNOSIS — Z6841 Body Mass Index (BMI) 40.0 and over, adult: Secondary | ICD-10-CM

## 2024-01-16 DIAGNOSIS — N183 Chronic kidney disease, stage 3 unspecified: Secondary | ICD-10-CM | POA: Diagnosis present

## 2024-01-16 DIAGNOSIS — G4733 Obstructive sleep apnea (adult) (pediatric): Secondary | ICD-10-CM | POA: Diagnosis present

## 2024-01-16 DIAGNOSIS — I4891 Unspecified atrial fibrillation: Secondary | ICD-10-CM | POA: Diagnosis present

## 2024-01-16 DIAGNOSIS — Z7901 Long term (current) use of anticoagulants: Secondary | ICD-10-CM | POA: Diagnosis not present

## 2024-01-16 DIAGNOSIS — E1122 Type 2 diabetes mellitus with diabetic chronic kidney disease: Secondary | ICD-10-CM | POA: Diagnosis present

## 2024-01-16 DIAGNOSIS — Z9841 Cataract extraction status, right eye: Secondary | ICD-10-CM

## 2024-01-16 DIAGNOSIS — Z87891 Personal history of nicotine dependence: Secondary | ICD-10-CM | POA: Diagnosis not present

## 2024-01-16 DIAGNOSIS — Z9842 Cataract extraction status, left eye: Secondary | ICD-10-CM

## 2024-01-16 DIAGNOSIS — G3184 Mild cognitive impairment, so stated: Secondary | ICD-10-CM | POA: Diagnosis present

## 2024-01-16 DIAGNOSIS — M8580 Other specified disorders of bone density and structure, unspecified site: Secondary | ICD-10-CM | POA: Diagnosis present

## 2024-01-16 DIAGNOSIS — I5032 Chronic diastolic (congestive) heart failure: Secondary | ICD-10-CM | POA: Diagnosis present

## 2024-01-16 DIAGNOSIS — J439 Emphysema, unspecified: Secondary | ICD-10-CM | POA: Diagnosis present

## 2024-01-16 DIAGNOSIS — I272 Pulmonary hypertension, unspecified: Secondary | ICD-10-CM | POA: Diagnosis present

## 2024-01-16 DIAGNOSIS — I2510 Atherosclerotic heart disease of native coronary artery without angina pectoris: Secondary | ICD-10-CM | POA: Diagnosis present

## 2024-01-16 DIAGNOSIS — N390 Urinary tract infection, site not specified: Secondary | ICD-10-CM | POA: Diagnosis present

## 2024-01-16 DIAGNOSIS — Z888 Allergy status to other drugs, medicaments and biological substances status: Secondary | ICD-10-CM

## 2024-01-16 DIAGNOSIS — Z880 Allergy status to penicillin: Secondary | ICD-10-CM

## 2024-01-16 DIAGNOSIS — G912 (Idiopathic) normal pressure hydrocephalus: Principal | ICD-10-CM | POA: Diagnosis present

## 2024-01-16 DIAGNOSIS — F419 Anxiety disorder, unspecified: Secondary | ICD-10-CM | POA: Diagnosis present

## 2024-01-16 DIAGNOSIS — Z7985 Long-term (current) use of injectable non-insulin antidiabetic drugs: Secondary | ICD-10-CM

## 2024-01-16 DIAGNOSIS — Z885 Allergy status to narcotic agent status: Secondary | ICD-10-CM

## 2024-01-16 DIAGNOSIS — Z90710 Acquired absence of both cervix and uterus: Secondary | ICD-10-CM

## 2024-01-16 DIAGNOSIS — I252 Old myocardial infarction: Secondary | ICD-10-CM

## 2024-01-16 DIAGNOSIS — Z9181 History of falling: Secondary | ICD-10-CM

## 2024-01-16 DIAGNOSIS — K660 Peritoneal adhesions (postprocedural) (postinfection): Secondary | ICD-10-CM | POA: Diagnosis present

## 2024-01-16 DIAGNOSIS — F3340 Major depressive disorder, recurrent, in remission, unspecified: Secondary | ICD-10-CM | POA: Diagnosis present

## 2024-01-16 DIAGNOSIS — Z961 Presence of intraocular lens: Secondary | ICD-10-CM | POA: Diagnosis present

## 2024-01-16 DIAGNOSIS — I4510 Unspecified right bundle-branch block: Secondary | ICD-10-CM | POA: Diagnosis present

## 2024-01-16 DIAGNOSIS — M47812 Spondylosis without myelopathy or radiculopathy, cervical region: Secondary | ICD-10-CM | POA: Diagnosis present

## 2024-01-16 HISTORY — PX: VENTRICULOPERITONEAL SHUNT: SHX204

## 2024-01-16 LAB — GLUCOSE, CAPILLARY
Glucose-Capillary: 89 mg/dL (ref 70–99)
Glucose-Capillary: 86 mg/dL (ref 70–99)
Glucose-Capillary: 85 mg/dL (ref 70–99)
Glucose-Capillary: 258 mg/dL — ABNORMAL HIGH (ref 70–99)

## 2024-01-16 SURGERY — SHUNT INSERTION VENTRICULAR-PERITONEAL
Anesthesia: General

## 2024-01-16 MED ORDER — PHENYLEPHRINE 80 MCG/ML (10ML) SYRINGE FOR IV PUSH (FOR BLOOD PRESSURE SUPPORT)
PREFILLED_SYRINGE | INTRAVENOUS | Status: DC | PRN
  Administered 2024-01-16: 160 ug via INTRAVENOUS

## 2024-01-16 MED ORDER — ORAL CARE MOUTH RINSE: 15.0000 mL | Freq: Once | OROMUCOSAL | Status: AC

## 2024-01-16 MED ORDER — BACITRACIN ZINC 500 UNIT/GM EX OINT
TOPICAL_OINTMENT | CUTANEOUS | Status: AC
  Filled 2024-01-16: qty 28.35

## 2024-01-16 MED ORDER — ACETAMINOPHEN 500 MG PO TABS
1000.0000 mg | ORAL_TABLET | Freq: Four times a day (QID) | ORAL | Status: DC
  Administered 2024-01-16 – 2024-01-21 (×16): 1000 mg via ORAL
  Filled 2024-01-16 (×16): qty 2

## 2024-01-16 MED ORDER — BUPIVACAINE-EPINEPHRINE (PF) 0.25% -1:200000 IJ SOLN
INTRAMUSCULAR | Status: AC
  Filled 2024-01-16: qty 30

## 2024-01-16 MED ORDER — METFORMIN HCL ER 500 MG PO TB24
500.0000 mg | ORAL_TABLET | Freq: Two times a day (BID) | ORAL | Status: DC
  Administered 2024-01-16 – 2024-01-21 (×10): 500 mg via ORAL
  Filled 2024-01-16 (×11): qty 1

## 2024-01-16 MED ORDER — OXYCODONE HCL 5 MG/5ML PO SOLN: 5.0000 mg | Freq: Once | ORAL | Status: DC | PRN

## 2024-01-16 MED ORDER — PROPOFOL 500 MG/50ML IV EMUL
INTRAVENOUS | Status: DC | PRN
  Administered 2024-01-16: 125 ug/kg/min via INTRAVENOUS

## 2024-01-16 MED ORDER — LIDOCAINE 2% (20 MG/ML) 5 ML SYRINGE
INTRAMUSCULAR | Status: DC | PRN
Start: 2024-01-16 — End: 2024-01-16
  Administered 2024-01-16: 100 mg via INTRAVENOUS

## 2024-01-16 MED ORDER — INSULIN GLARGINE 100 UNIT/ML ~~LOC~~ SOLN
23.0000 [IU] | Freq: Every day | SUBCUTANEOUS | Status: DC
  Filled 2024-01-16: qty 0.23

## 2024-01-16 MED ORDER — BUPIVACAINE HCL (PF) 0.5 % IJ SOLN
INTRAMUSCULAR | Status: AC
  Filled 2024-01-16: qty 30

## 2024-01-16 MED ORDER — PANTOPRAZOLE SODIUM 40 MG IV SOLR: 40.0000 mg | Freq: Every day | INTRAVENOUS | Status: DC

## 2024-01-16 MED ORDER — SUGAMMADEX SODIUM 200 MG/2ML IV SOLN
INTRAVENOUS | Status: DC | PRN
  Administered 2024-01-16: 200 mg via INTRAVENOUS

## 2024-01-16 MED ORDER — MONTELUKAST SODIUM 10 MG PO TABS
10.0000 mg | ORAL_TABLET | Freq: Every day | ORAL | Status: DC
  Administered 2024-01-16 – 2024-01-20 (×5): 10 mg via ORAL
  Filled 2024-01-16 (×5): qty 1

## 2024-01-16 MED ORDER — PROMETHAZINE HCL 12.5 MG PO TABS: 12.5000 mg | ORAL_TABLET | ORAL | Status: DC | PRN

## 2024-01-16 MED ORDER — BUPIVACAINE HCL (PF) 0.5 % IJ SOLN
INTRAMUSCULAR | Status: DC | PRN
  Administered 2024-01-16: 9 mL

## 2024-01-16 MED ORDER — FLUOXETINE HCL 20 MG PO CAPS
20.0000 mg | ORAL_CAPSULE | Freq: Every day | ORAL | Status: DC
  Administered 2024-01-17 – 2024-01-21 (×5): 20 mg via ORAL
  Filled 2024-01-16 (×5): qty 1

## 2024-01-16 MED ORDER — PHENYLEPHRINE HCL-NACL 20-0.9 MG/250ML-% IV SOLN
INTRAVENOUS | Status: DC | PRN
Start: 2024-01-16 — End: 2024-01-16
  Administered 2024-01-16: 25 ug/min via INTRAVENOUS

## 2024-01-16 MED ORDER — ALBUTEROL SULFATE (2.5 MG/3ML) 0.083% IN NEBU: 2.5000 mg | INHALATION_SOLUTION | Freq: Four times a day (QID) | RESPIRATORY_TRACT | Status: DC | PRN

## 2024-01-16 MED ORDER — OXYCODONE HCL 5 MG PO TABS: 5.0000 mg | ORAL_TABLET | Freq: Once | ORAL | Status: DC | PRN

## 2024-01-16 MED ORDER — LABETALOL HCL 5 MG/ML IV SOLN: 10.0000 mg | INTRAVENOUS | Status: DC | PRN

## 2024-01-16 MED ORDER — INSULIN ASPART 100 UNIT/ML IJ SOLN: 0.0000 [IU] | INTRAMUSCULAR | Status: DC | PRN

## 2024-01-16 MED ORDER — BUPROPION HCL ER (XL) 150 MG PO TB24
300.0000 mg | ORAL_TABLET | Freq: Every day | ORAL | Status: DC
  Administered 2024-01-17 – 2024-01-21 (×5): 300 mg via ORAL
  Filled 2024-01-16 (×5): qty 2
  Filled 2024-01-16: qty 1

## 2024-01-16 MED ORDER — ONDANSETRON HCL 4 MG/2ML IJ SOLN: 4.0000 mg | Freq: Four times a day (QID) | INTRAMUSCULAR | Status: DC | PRN

## 2024-01-16 MED ORDER — ONDANSETRON HCL 4 MG/2ML IJ SOLN: 4.0000 mg | INTRAMUSCULAR | Status: DC | PRN

## 2024-01-16 MED ORDER — LIDOCAINE HCL 1 % IJ SOLN
INTRAMUSCULAR | Status: AC
  Filled 2024-01-16: qty 20

## 2024-01-16 MED ORDER — PROPOFOL 10 MG/ML IV BOLUS
INTRAVENOUS | Status: DC | PRN
  Administered 2024-01-16: 120 mg via INTRAVENOUS

## 2024-01-16 MED ORDER — DEXAMETHASONE SOD PHOSPHATE PF 10 MG/ML IJ SOLN
INTRAMUSCULAR | Status: DC | PRN
  Administered 2024-01-16: 4 mg via INTRAVENOUS

## 2024-01-16 MED ORDER — ONDANSETRON HCL 4 MG/2ML IJ SOLN
INTRAMUSCULAR | Status: DC | PRN
  Administered 2024-01-16: 4 mg via INTRAVENOUS

## 2024-01-16 MED ORDER — FENTANYL CITRATE (PF) 100 MCG/2ML IJ SOLN: 25.0000 ug | INTRAMUSCULAR | Status: DC | PRN

## 2024-01-16 MED ORDER — PROPOFOL 10 MG/ML IV BOLUS
INTRAVENOUS | Status: AC
Start: 2024-01-16 — End: 2024-01-16
  Filled 2024-01-16: qty 20

## 2024-01-16 MED ORDER — VANCOMYCIN HCL IN DEXTROSE 1-5 GM/200ML-% IV SOLN: 1000.0000 mg | INTRAVENOUS | Status: AC

## 2024-01-16 MED ORDER — PANTOPRAZOLE SODIUM 40 MG PO TBEC
40.0000 mg | DELAYED_RELEASE_TABLET | Freq: Every day | ORAL | Status: DC
  Administered 2024-01-16 – 2024-01-20 (×5): 40 mg via ORAL
  Filled 2024-01-16 (×5): qty 1

## 2024-01-16 MED ORDER — FENTANYL CITRATE (PF) 250 MCG/5ML IJ SOLN
INTRAMUSCULAR | Status: AC
  Filled 2024-01-16: qty 5

## 2024-01-16 MED ORDER — 0.9 % SODIUM CHLORIDE (POUR BTL) OPTIME
TOPICAL | Status: DC | PRN
  Administered 2024-01-16: 1000 mL

## 2024-01-16 MED ORDER — ATORVASTATIN CALCIUM 10 MG PO TABS
10.0000 mg | ORAL_TABLET | Freq: Every day | ORAL | Status: DC
  Administered 2024-01-16 – 2024-01-20 (×5): 10 mg via ORAL
  Filled 2024-01-16 (×5): qty 1

## 2024-01-16 MED ORDER — CHLORHEXIDINE GLUCONATE 0.12 % MT SOLN: 15.0000 mL | Freq: Once | OROMUCOSAL | Status: AC

## 2024-01-16 MED ORDER — VANCOMYCIN HCL IN DEXTROSE 1-5 GM/200ML-% IV SOLN
INTRAVENOUS | Status: AC
Start: 2024-01-16 — End: 2024-01-16
  Administered 2024-01-16: 1000 mg via INTRAVENOUS
  Filled 2024-01-16: qty 200

## 2024-01-16 MED ORDER — THROMBIN 5000 UNITS EX KIT
PACK | CUTANEOUS | Status: AC
  Filled 2024-01-16: qty 1

## 2024-01-16 MED ORDER — LIDOCAINE-EPINEPHRINE 1 %-1:100000 IJ SOLN
INTRAMUSCULAR | Status: AC
  Filled 2024-01-16: qty 1

## 2024-01-16 MED ORDER — VITAMIN D 25 MCG (1000 UNIT) PO TABS
5000.0000 [IU] | ORAL_TABLET | Freq: Every day | ORAL | Status: DC
  Administered 2024-01-16 – 2024-01-21 (×6): 5000 [IU] via ORAL
  Filled 2024-01-16 (×6): qty 5

## 2024-01-16 MED ORDER — INSULIN GLARGINE-YFGN 100 UNIT/ML ~~LOC~~ SOLN
23.0000 [IU] | Freq: Every day | SUBCUTANEOUS | Status: DC
  Administered 2024-01-16 – 2024-01-20 (×5): 23 [IU] via SUBCUTANEOUS
  Filled 2024-01-16 (×6): qty 0.23

## 2024-01-16 MED ORDER — CHLORHEXIDINE GLUCONATE 0.12 % MT SOLN
OROMUCOSAL | Status: AC
  Administered 2024-01-16: 15 mL via OROMUCOSAL
  Filled 2024-01-16: qty 15

## 2024-01-16 MED ORDER — THROMBIN 5000 UNITS EX SOLR
OROMUCOSAL | Status: DC | PRN
  Administered 2024-01-16: 5 mL via TOPICAL

## 2024-01-16 MED ORDER — ROCURONIUM BROMIDE 10 MG/ML (PF) SYRINGE
PREFILLED_SYRINGE | INTRAVENOUS | Status: DC | PRN
  Administered 2024-01-16: 50 mg via INTRAVENOUS

## 2024-01-16 MED ORDER — CHLORHEXIDINE GLUCONATE CLOTH 2 % EX PADS: 6.0000 | MEDICATED_PAD | Freq: Once | CUTANEOUS | Status: DC

## 2024-01-16 MED ORDER — INSULIN ASPART 100 UNIT/ML IJ SOLN
0.0000 [IU] | Freq: Three times a day (TID) | INTRAMUSCULAR | Status: DC
  Administered 2024-01-17: 3 [IU] via SUBCUTANEOUS
  Administered 2024-01-18: 2 [IU] via SUBCUTANEOUS
  Administered 2024-01-20 – 2024-01-21 (×2): 3 [IU] via SUBCUTANEOUS
  Filled 2024-01-16: qty 2
  Filled 2024-01-16 (×3): qty 3

## 2024-01-16 MED ORDER — CEPHALEXIN 250 MG PO CAPS
250.0000 mg | ORAL_CAPSULE | Freq: Every day | ORAL | Status: DC
  Administered 2024-01-17 – 2024-01-21 (×5): 250 mg via ORAL
  Filled 2024-01-16 (×5): qty 1

## 2024-01-16 MED ORDER — LACTATED RINGERS IV SOLN: INTRAVENOUS | Status: DC

## 2024-01-16 MED ORDER — LIDOCAINE-EPINEPHRINE 1 %-1:100000 IJ SOLN
INTRAMUSCULAR | Status: DC | PRN
  Administered 2024-01-16: 9 mL

## 2024-01-16 MED ORDER — ONDANSETRON HCL 4 MG PO TABS
4.0000 mg | ORAL_TABLET | ORAL | Status: DC | PRN
  Administered 2024-01-19: 4 mg via ORAL
  Filled 2024-01-16: qty 1

## 2024-01-16 MED ORDER — TRAMADOL HCL 50 MG PO TABS
50.0000 mg | ORAL_TABLET | Freq: Four times a day (QID) | ORAL | Status: DC | PRN
  Administered 2024-01-16 – 2024-01-18 (×5): 50 mg via ORAL
  Filled 2024-01-16 (×7): qty 1

## 2024-01-16 MED ORDER — FENTANYL CITRATE (PF) 250 MCG/5ML IJ SOLN
INTRAMUSCULAR | Status: DC | PRN
  Administered 2024-01-16: 100 ug via INTRAVENOUS

## 2024-01-16 SURGICAL SUPPLY — 57 items
BAG COUNTER SPONGE SURGICOUNT (BAG) ×1 IMPLANT
BLADE CLIPPER SURG (BLADE) ×2 IMPLANT
BLADE SURG 11 STRL SS (BLADE) ×2 IMPLANT
BUR PRECISION FLUTE 5.0 (BURR) ×1 IMPLANT
CANISTER SUCTION 3000ML PPV (SUCTIONS) ×1 IMPLANT
CATH VENTRICULAR 7CM (Shunt) IMPLANT
CLAMP SUTURE YELLOW 5 PAIRS (MISCELLANEOUS) IMPLANT
CLIP RANEY DISP (INSTRUMENTS) IMPLANT
DRAPE HALF SHEET 40X57 (DRAPES) ×1 IMPLANT
DRAPE INCISE IOBAN 66X45 STRL (DRAPES) ×1 IMPLANT
DRAPE SURG ORHT 6 SPLT 77X108 (DRAPES) ×2 IMPLANT
DRSG OPSITE POSTOP 3X4 (GAUZE/BANDAGES/DRESSINGS) IMPLANT
DRSG OPSITE POSTOP 4X6 (GAUZE/BANDAGES/DRESSINGS) IMPLANT
DURAPREP 26ML APPLICATOR (WOUND CARE) ×2 IMPLANT
ELECTRODE REM PT RTRN 9FT ADLT (ELECTROSURGICAL) ×1 IMPLANT
GAUZE 4X4 16PLY ~~LOC~~+RFID DBL (SPONGE) IMPLANT
GLOVE BIOGEL PI IND STRL 7.5 (GLOVE) ×3 IMPLANT
GLOVE ECLIPSE 7.0 STRL STRAW (GLOVE) ×2 IMPLANT
GLOVE INDICATOR 6.5 STRL GRN (GLOVE) IMPLANT
GOWN STRL REUS W/ TWL LRG LVL3 (GOWN DISPOSABLE) ×2 IMPLANT
GOWN STRL REUS W/ TWL XL LVL3 (GOWN DISPOSABLE) IMPLANT
GOWN STRL REUS W/TWL 2XL LVL3 (GOWN DISPOSABLE) IMPLANT
HEMOSTAT SURGICEL 2X14 (HEMOSTASIS) IMPLANT
KIT BASIN OR (CUSTOM PROCEDURE TRAY) ×1 IMPLANT
KIT TURNOVER KIT B (KITS) ×1 IMPLANT
MARKER SKIN DUAL TIP RULER LAB (MISCELLANEOUS) ×2 IMPLANT
NDL HYPO 25X1 1.5 SAFETY (NEEDLE) ×1 IMPLANT
NEEDLE HYPO 25X1 1.5 SAFETY (NEEDLE) ×1 IMPLANT
PACK LAMINECTOMY NEURO (CUSTOM PROCEDURE TRAY) ×1 IMPLANT
PAD ARMBOARD POSITIONER FOAM (MISCELLANEOUS) ×3 IMPLANT
PASSER CATH 65CM DISP (NEUROSURGERY SUPPLIES) ×1 IMPLANT
SCISSORS LAP 5X35 DISP (ENDOMECHANICALS) IMPLANT
SET SHEATH INTRO PEEL-AWY 10FR (MISCELLANEOUS) IMPLANT
SET TUBE SMOKE EVAC HIGH FLOW (TUBING) IMPLANT
SHEATH PERITONEAL INTRO 61 (SHEATH) ×1 IMPLANT
SHUNT STRATA 11 SNAP REG (Shunt) IMPLANT
SLEEVE Z-THREAD 5X100MM (TROCAR) IMPLANT
SOLN 0.9% NACL POUR BTL 1000ML (IV SOLUTION) ×1 IMPLANT
SOLN STERILE WATER BTL 1000 ML (IV SOLUTION) ×1 IMPLANT
SPIKE FLUID TRANSFER (MISCELLANEOUS) ×1 IMPLANT
SPONGE SURGIFOAM ABS GEL SZ50 (HEMOSTASIS) ×1 IMPLANT
SPONGE T-LAP 4X18 ~~LOC~~+RFID (SPONGE) IMPLANT
STAPLER SKIN PROX 35W (STAPLE) ×1 IMPLANT
STRIP CLOSURE SKIN 1/2X4 (GAUZE/BANDAGES/DRESSINGS) IMPLANT
SUT ETHILON 3 0 PS 1 (SUTURE) IMPLANT
SUT MNCRL AB 4-0 PS2 18 (SUTURE) IMPLANT
SUT NURALON 4 0 TR CR/8 (SUTURE) IMPLANT
SUT SILK 0 100YDS SPOOL (SUTURE) ×1 IMPLANT
SUT SILK 3 0 SH 30 (SUTURE) IMPLANT
SUT VIC AB 3-0 SH 27X BRD (SUTURE) ×1 IMPLANT
SUT VIC AB 3-0 SH 8-18 (SUTURE) ×2 IMPLANT
TOWEL GREEN STERILE (TOWEL DISPOSABLE) ×2 IMPLANT
TOWEL GREEN STERILE FF (TOWEL DISPOSABLE) ×1 IMPLANT
TROCAR Z-THREAD OPTICAL 5X100M (TROCAR) IMPLANT
TUBE CONNECTING 12X1/4 (SUCTIONS) ×1 IMPLANT
UNDERPAD 30X36 HEAVY ABSORB (UNDERPADS AND DIAPERS) ×1 IMPLANT
WARMER LAPAROSCOPE (MISCELLANEOUS) IMPLANT

## 2024-01-16 NOTE — Op Note (Signed)
  NEUROSURGERY OPERATIVE NOTE   PREOP DIAGNOSIS: Normal Pressure Hydrocephalus  POSTOP DIAGNOSIS: Same  PROCEDURE: 1. Laparoscopic Assisted right frontal ventriculoperitoneal shunt placement  SURGEON: Dr. Gerldine Maizes, MD  CO-SURGEON: Dr. Jina Nephew, MD  ANESTHESIA: General Endotracheal  EBL: Minimal  SPECIMENS: None  DRAINS: None  COMPLICATIONS: None immediate  CONDITION: Stable to PACU  SHUNT PLACED: Med[employer] Strata II, set to 1.5  HISTORY: Sarah Miller is a 78 y.o. female presenting to the outpatient neurosurgery clinic with clinical symptoms consistent with normal pressure hydrocephalus including progressive gait instability, symptoms of urinary incontinence, and cognitive decline including memory disturbance.  Her CT scan and MRI did reveal ventriculomegaly although not significantly out of proportion to the degree of cerebral atrophy.  She did undergo high-volume LP which did provide transient improvement in symptoms.  After lengthy discussion of treatment options, the patient and her daughter elected to proceed with placement of a ventriculoperitoneal shunt.  The risks, benefits, and alternatives to surgery were all reviewed in detail with the patient and her family.  After all questions were answered informed consent was obtained and witnessed.  PROCEDURE IN DETAIL: The patient was brought to the operating room and transferred to the operative table. After induction of general anesthesia, the patient was positioned on the operative table in the supine position with all pressure points meticulously padded. The skin of the scalp,  neck, chest, and abdomen were prepped in the usual sterile fashion.  After timeout was conducted, the curvilinear right frontal skin incision over Kocher's point was infiltrated with local anesthetic with epinephrine .  Incision was then made sharply and carried down through the galea.  Hemostasis was secured.  A subcutaneous pocket was  created over the right frontal region and another subcutaneous tunnel was created into the retroauricular region.  A counterincision was made, and the shunt apparatus was passed and the valve seated subcutaneously.  Shunt passer was then used from the retroauricular incision down into the right upper quadrant and the distal shunt catheter was passed subcutaneously.  High-speed drill was used to create a bur hole over Kocher's point.  The dura was then coagulated and incised.  A 7 cm ventricular catheter was then passed into the right frontal horn in a single attempt with good flow of clear CSF.  The ventricular catheter was then attached to the valve.  The distal portion of the catheter was then placed into the peritoneal cavity under laparoscopic guidance by Dr. Nephew, the details of which are dictated in a separate report. The valve was then pumped, and good distal flow was observed through the laparoscope within the peritoneal cavity.  At this point the scalp wounds were irrigated with copious amounts of bacitracin irrigation, and closed using 3-0 Vicryl stitches.  The skin was then closed using standard surgical skin staples.  Sterile dressings were then applied.  At the end of the case all sponge needle and instrument counts were correct.  The patient tolerated the procedure well and was extubated in the room and taken to the postanesthesia care unit in stable condition.   Gerldine Maizes, MD [facility]

## 2024-01-16 NOTE — Plan of Care (Signed)
  Problem: Education: Goal: Knowledge of General Education information will improve Description: Including pain rating scale, medication(s)/side effects and non-pharmacologic comfort measures Outcome: Progressing   Problem: Health Behavior/Discharge Planning: Goal: Ability to manage health-related needs will improve Outcome: Progressing   Problem: Clinical Measurements: Goal: Ability to maintain clinical measurements within normal limits will improve Outcome: Progressing Goal: Will remain free from infection Outcome: Progressing Goal: Diagnostic test results will improve Outcome: Progressing Goal: Respiratory complications will improve Outcome: Progressing Goal: Cardiovascular complication will be avoided Outcome: Progressing   Problem: Activity: Goal: Risk for activity intolerance will decrease Outcome: Progressing   Problem: Nutrition: Goal: Adequate nutrition will be maintained Outcome: Progressing   Problem: Coping: Goal: Level of anxiety will decrease Outcome: Progressing   Problem: Elimination: Goal: Will not experience complications related to bowel motility Outcome: Progressing Goal: Will not experience complications related to urinary retention Outcome: Progressing   Problem: Pain Managment: Goal: General experience of comfort will improve and/or be controlled Outcome: Progressing   Problem: Safety: Goal: Ability to remain free from injury will improve Outcome: Progressing   Problem: Skin Integrity: Goal: Risk for impaired skin integrity will decrease Outcome: Progressing   Problem: Education: Goal: Ability to describe self-care measures that may prevent or decrease complications (Diabetes Survival Skills Education) will improve Outcome: Progressing Goal: Individualized Educational Video(s) Outcome: Progressing   Problem: Coping: Goal: Ability to adjust to condition or change in health will improve Outcome: Progressing   Problem: Fluid  Volume: Goal: Ability to maintain a balanced intake and output will improve Outcome: Progressing   Problem: Health Behavior/Discharge Planning: Goal: Ability to identify and utilize available resources and services will improve Outcome: Progressing Goal: Ability to manage health-related needs will improve Outcome: Progressing   Problem: Metabolic: Goal: Ability to maintain appropriate glucose levels will improve Outcome: Progressing   Problem: Nutritional: Goal: Maintenance of adequate nutrition will improve Outcome: Progressing Goal: Progress toward achieving an optimal weight will improve Outcome: Progressing   Problem: Skin Integrity: Goal: Risk for impaired skin integrity will decrease Outcome: Progressing   Problem: Tissue Perfusion: Goal: Adequacy of tissue perfusion will improve Outcome: Progressing   Problem: Education: Goal: Knowledge of the prescribed therapeutic regimen will improve Outcome: Progressing   Problem: Clinical Measurements: Goal: Usual level of consciousness will be regained or maintained. Outcome: Progressing Goal: Neurologic status will improve Outcome: Progressing Goal: Ability to maintain intracranial pressure will improve Outcome: Progressing   Problem: Skin Integrity: Goal: Demonstration of wound healing without infection will improve Outcome: Progressing

## 2024-01-16 NOTE — Op Note (Addendum)
 PRE-OPERATIVE DIAGNOSIS: normal pressure hydrocephalus  POST-OPERATIVE DIAGNOSIS:  Same  PROCEDURE:  Procedure(s): Lap assisted Ventriculoperitoneal shunt  SURGEON:  Surgeon(s): Jina Nephew, MD  COSURGEON: Gerldine Maizes, MD  ASSISTANT:  Margretta Brash, MD  ANESTHESIA:   local and general  DRAINS: Ventriculostomy Drain in the peritoneum   LOCAL MEDICATIONS USED:  BUPIVICAINE  and LIDOCAINE    SPECIMEN:  No Specimen  DISPOSITION OF SPECIMEN:  N/A  COUNTS:  YES  DICTATION: .Dragon Dictation  PLAN OF CARE: Admit to inpatient   PATIENT DISPOSITION:  PACU - hemodynamically stable.  FINDINGS: Adhesions in the lower abdomen, subcentimeter umbilical hernia  EBL: Minimal  PROCEDURE:  Patient was identified in the holding area and taken the operating room where she was placed on the operating room table.  General endotracheal anesthesia was induced.  She was bumped into the correct position slightly turned laterally to the left.  The patient's right head, neck, chest, and the abdomen were prepped and draped in sterile fashion.  A timeout was performed according to the surgical safety checklist.  When all was correct, we continued.  Dr. Maizes accessed the ventricle and tunneled the catheter from the scalp along the neck and the right chest to the right upper abdomen where we made a counterincision to remove the tunneler sheath.  The catheter was passed through the sheath and the sheath was removed.  Dr. Maizes did his portion of the shunt with the scalp and cranium while we performed the intraperitoneal portion.  The patient was then placed into reverse Trendelenburg position and rotated to the right.  Local anesthetic was administered at the costal margin.  A 5 mm trocar was placed under direct visualization using Optiview technique at the left costal margin.  Pneumoperitoneum was achieved.  There were significant adhesions from her previous hysterectomy that went up to the  umbilicus.  A second trocar was placed under direct visualization in the left upper abdomen.  The filmy adhesions were taken down sharply with cold scissors.  When we got to the lower abdomen, there was some adhesion of the colon to the abdominal wall.  Some of this was able to be taken down bluntly on the right lateral abdominal wall, but the adhesions on the left were more dense and these were left in place.  There was a very small umbilical hernia with some incarcerated omentum.  This was removed bluntly with no evidence of bleeding.  A 10 French sheath was placed using the Seldinger technique at the site of the tunneler exit site.  The catheter was placed through the tunneler sheath.  The tunneler sheath was then pulled away once the entire catheter was in the abdomen.  The catheter was then looped in the abdomen.  We confirmed that we could see the CSF flow in the catheter.  We evaluated the abdomen for any evidence of bleeding or succus and there was none.  The pneumoperitoneum was allowed to evacuate.  The trocars were removed.  The skin of the incisions was then closed with 4-0 Monocryl in subcuticular fashion.  The wounds were cleaned, dried, and dressed with Dermabond.

## 2024-01-16 NOTE — H&P (Signed)
 Chief Complaint   Gait instability, incontinence, confusion  History of Present Illness  Sarah Miller is a 78 year old woman seeen for initial consultation as a second opinion regarding normal-pressure hydrocephalus.  Briefly, the patient is accompanied by her daughter who helps provide history.  They noted onset initially of some cognitive dysfunction including memory lapses and poor judgment starting this spring.  She has since also began complaining of urinary incontinence, now confined to the use of depends diapers.  In addition, they have noted progressively worsening gait instability to the point where she is now using a wheelchair outside the house because she is prone to falls.  She has been through outpatient courses of physical therapy including physical therapy through Neurology without any significant improvement in symptoms.  They were entertaining a diagnosis of normal pressure hydrocephalus.  She did undergo high-volume lumbar puncture.  But the patient and her daughter report improvement in symptoms especially her gait instability for the course of about 48 hours after which she was diagnosed with the UTI and had declined back to baseline.  They did see Dr. Janjua and spoke about placement of a shunt however due to some family acquaintances who know me, they sought a second opinion in our office.  Of note, the patient does have a history of reasonably well controlled diabetes.  She has history of atrial fibrillation currently maintained on Eliquis .  The previous heart attack.  No known lung, liver, or kidney disease.  She is a nonsmoker.  Past Medical History   Past Medical History:  Diagnosis Date   Allergic rhinitis    Anxiety    Atrial fibrillation with RVR    CAD (coronary artery disease), native coronary artery    coronary CTA done 12/25/2019 showing coronary calcium score of 60 which was 72nd percentile for age and sex matched controls along with 50 to 70% proximal RCA that  was nondominant, less than 25% mid LAD as well as mid left circumflex.   Cerebral ventriculomegaly 12/13/2023   Cervical arthritis    [name], S/P injection with good results.    Chronic diastolic CHF (congestive heart failure)    COPD (chronic obstructive pulmonary disease)    with emphysema     DOE (dyspnea on exertion) 02/03/2019   - PFTs 02/02/2019 unable to do spirometry, mod decreased lung vol with ERV 10% c/w obesity effects      Dyslipidemia    Early menopause    Frequent falls    Frozen shoulder    H/O frozen shoulder and rotator cuff tendonititis, L s/p injection   Gastroesophageal reflux disease without esophagitis    Hardening of the aorta (main artery of the heart) 11/29/2023   History of melanoma    1978 and 1979   Intrinsic sphincter deficiency (ISD) 02/06/2017   Added automatically from request for surgery [id number]     Iron deficiency anemia    Mild neurocognitive disorder 12/13/2023   Morbid obesity 02/03/2019   PFTs 02/02/2019 with erv 10% c/w body habitus     Neutrophilia    Obstructive sleep apnea    Osteopenia    PONV (postoperative nausea and vomiting)    Pulmonary hypertension    RBBB (right bundle branch block) 03/19/2013   Recurrent major depression in remission    Stage 3 chronic kidney disease    Type 2 diabetes mellitus    Upper airway cough syndrome vs cough variant asthma 04/18/2018   Onset mid sept 2019  - Allergy  profile  02/28/2018    IgE  312  RAST neg     - 04/17/2018 d/c breo/ max rx for gerd/ cyclical cough   - PFTs 02/02/2019 unable to do spirometry, mod decreased lung vol with ERV 10% c/w obesity effects   - 08/05/2019  After extensive coaching inhaler device,  effectiveness =    75% (short Ti)      Urinary incontinence    Vitamin D deficiency     Past Surgical History   Past Surgical History:  Procedure Laterality Date   ABDOMINAL HYSTERECTOMY     CATARACT EXTRACTION W/ INTRAOCULAR LENS IMPLANT Bilateral    FRACTURE SURGERY Left     wrist   MELANOMA EXCISION     REVERSE SHOULDER ARTHROPLASTY Left 12/28/2019   Procedure: REVERSE SHOULDER ARTHROPLASTY;  Surgeon: [name], MD;  Location: [facility];  Service: Orthopedics;  Laterality: Left;    Social History   Social History   Tobacco Use   Smoking status: Former    Current packs/day: 0.00    Average packs/day: 0.8 packs/day for 53.0 years (39.8 ttl pk-yrs)    Types: Cigarettes    Start date: 02/27/1963    Quit date: 02/27/2016    Years since quitting: 7.8   Smokeless tobacco: Never  Vaping Use   Vaping status: Never Used  Substance Use Topics   Alcohol use: Not Currently    Comment: rare   Drug use: No    Medications   Prior to Admission medications   Medication Sig Start Date End Date Taking? Authorizing Provider  albuterol  (VENTOLIN  HFA) 108 (90 Base) MCG/ACT inhaler Inhale 1-2 puffs into the lungs every 6 (six) hours as needed for wheezing or shortness of breath. 07/23/23  Yes [provider]  apixaban  (ELIQUIS ) 5 MG TABS tablet Take 1 tablet (5 mg total) by mouth 2 (two) times daily. 10/01/23  Yes Turner, Wilbert SAUNDERS, MD  atorvastatin (LIPITOR) 10 MG tablet Take 10 mg by mouth at bedtime. 09/02/17  Yes [provider]  buPROPion  (WELLBUTRIN  XL) 300 MG 24 hr tablet Take 300 mg by mouth daily.    Yes [provider]  cephALEXin (KEFLEX) 250 MG capsule Take 250 mg by mouth daily. 01/03/24  Yes [provider]  Cholecalciferol (VITAMIN D3) 125 MCG (5000 UT) CAPS Take 5,000 Units by mouth daily.   Yes [provider]  FLUoxetine (PROZAC) 20 MG capsule Take 20 mg by mouth daily.   Yes [provider]  Insulin  Glargine (BASAGLAR  KWIKPEN) 100 UNIT/ML Inject 20 Units into the skin at bedtime. Patient taking differently: Inject 23 Units into the skin at bedtime. 07/29/23  Yes Vann, Jessica U, DO  metFORMIN (GLUCOPHAGE-XR) 500 MG 24 hr tablet Take 500 mg by mouth in the morning and at bedtime. 10/16/17  Yes [provider]  montelukast  (SINGULAIR ) 10 MG tablet TAKE 1 TABLET BY MOUTH EVERYDAY AT BEDTIME 02/08/20  Yes Wert, Michael B, MD  MOUNJARO 7.5 MG/0.5ML Pen Inject 7.5 mg into the skin once a week. 07/08/23  Yes [provider]  ondansetron  (ZOFRAN ) 4 MG tablet Take 4 mg by mouth every 8 (eight) hours as needed for nausea or vomiting. 07/19/23  Yes [provider]  fluticasone  (FLONASE ) 50 MCG/ACT nasal spray SPRAY 2 SPRAYS INTO EACH NOSTRIL EVERY DAY Patient not taking: Reported on 01/10/2024 03/17/20   Neda Jennet LABOR, MD  furosemide  (LASIX ) 20 MG tablet Take 1 tablet (20 mg total) by mouth daily as needed for fluid or  edema. Patient not taking: Reported on 01/10/2024 07/29/23   Vann, Jessica U, DO  [facility] ULTRA TEST test strip SMARTSIG:50 Daily 08/07/23   [provider]    Allergies   Allergies  Allergen Reactions   Diazepam Other (See Comments)   Escitalopram Other (See Comments)   Morphine And Codeine Nausea Only   Other     hydocan cough syrup causes nausea   Penicillins Rash    Tolerated Cephalosporin 12/28/2019.     Review of Systems  ROS  Neurologic Exam  Awake, alert, oriented Memory and concentration grossly intact Speech fluent, appropriate CN grossly intact Motor exam: Upper Extremities Deltoid Bicep Tricep Grip  Right 5/5 5/5 5/5 5/5  Left 5/5 5/5 5/5 5/5   Lower Extremities IP Quad PF DF EHL  Right 5/5 5/5 5/5 5/5 5/5  Left 5/5 5/5 5/5 5/5 5/5   Sensation grossly intact to LT  Imaging  MRI demonstrates ventriculomegaly, although not significantly out of proportion to the degree of cerebral atrophy.  Impression  - 78 y.o. female with s/sx c/w NPH, transient improvement with high-volume LP  Plan  - Will proceed with placement of lap-assisted VPS  I have reviewed the indications for the procedure as well as the details of the procedure and the expected postoperative course and recovery at length with the patient and her  daughter in the office. We have also reviewed in detail the risks, benefits, and alternatives to the procedure. All questions were answered and Sarah Miller provided informed consent to proceed.  Gerldine Maizes, MD [facility]

## 2024-01-16 NOTE — Transfer of Care (Signed)
 Immediate Anesthesia Transfer of Care Note  Patient: Sarah Miller  Procedure(s) Performed: SHUNT INSERTION VENTRICULAR-PERITONEAL  Patient Location: PACU  Anesthesia Type:General  Level of Consciousness: awake, alert , and oriented  Airway & Oxygen Therapy: Patient Spontanous Breathing and Patient connected to nasal cannula oxygen  Post-op Assessment: Report given to RN and Post -op Vital signs reviewed and stable  Post vital signs: Reviewed and stable  Last Vitals:  Vitals Value Taken Time  BP 124/78 01/16/24 16:15  Temp 36.1 C 01/16/24 16:00  Pulse 69 01/16/24 16:20  Resp 16 01/16/24 16:20  SpO2 95 % 01/16/24 16:20  Vitals shown include unfiled device data.  Last Pain:  Vitals:   01/16/24 1600  TempSrc:   PainSc: 0-No pain         Complications: No notable events documented.

## 2024-01-16 NOTE — Anesthesia Procedure Notes (Signed)
 Procedure Name: Intubation Date/Time: 01/16/2024 2:35 PM  Performed by: Lockie Flesher, CRNAPre-anesthesia Checklist: Patient identified, Emergency Drugs available, Suction available and Patient being monitored Patient Re-evaluated:Patient Re-evaluated prior to induction Oxygen Delivery Method: Circle System Utilized Preoxygenation: Pre-oxygenation with 100% oxygen Induction Type: IV induction Ventilation: Mask ventilation without difficulty Laryngoscope Size: Mac and 3 Grade View: Grade I Tube type: Oral Tube size: 7.0 mm Number of attempts: 1 Airway Equipment and Method: Stylet and Oral airway Placement Confirmation: ETT inserted through vocal cords under direct vision, positive ETCO2 and breath sounds checked- equal and bilateral Secured at: 22 cm Tube secured with: Tape Dental Injury: Teeth and Oropharynx as per pre-operative assessment

## 2024-01-17 ENCOUNTER — Encounter (HOSPITAL_COMMUNITY): Payer: Self-pay | Admitting: Neurosurgery

## 2024-01-17 LAB — GLUCOSE, CAPILLARY
Glucose-Capillary: 111 mg/dL — ABNORMAL HIGH (ref 70–99)
Glucose-Capillary: 115 mg/dL — ABNORMAL HIGH (ref 70–99)
Glucose-Capillary: 161 mg/dL — ABNORMAL HIGH (ref 70–99)
Glucose-Capillary: 173 mg/dL — ABNORMAL HIGH (ref 70–99)

## 2024-01-17 NOTE — Progress Notes (Signed)
  NEUROSURGERY PROGRESS NOTE   No issues overnight. Pt with minimal incisional pain.  EXAM:  BP (!) 141/74 (BP Location: Left Arm)   Pulse 78   Temp 98.2 F (36.8 C) (Oral)   Resp 20   Ht 5' (1.524 m)   Wt 93.4 kg   LMP  (LMP Unknown)   SpO2 96%   BMI 40.21 kg/m   Awake, alert, oriented  Speech fluent, appropriate  CN grossly intact  5/5 BUE/BLE   IMPRESSION:  78 y.o. female POD#1 lap-assisted VPS for NPH. While patient appears to be at baseline, daughter does not feel she can take care of her at home.  PLAN: - Will transfer to neuro-stepdown floor for further PT/OT. May consider CIR or SNF placement if can't go home.   Gerldine Maizes, MD [facility]

## 2024-01-17 NOTE — Progress Notes (Signed)
    Durable Medical Equipment  (From admission, onward)           Start     Ordered   01/17/24 1004  For home use only DME standard manual wheelchair with seat cushion  Once       Comments: Patient suffers from weakness which impairs their ability to perform daily activities like bathing, dressing, and grooming in the home.  A walker will not resolve issue with performing activities of daily living. A wheelchair will allow patient to safely perform daily activities. Patient can safely propel the wheelchair in the home or has a caregiver who can provide assistance. Length of need Lifetime. Accessories: elevating leg rests (ELRs), wheel locks, extensions and anti-tippers.   01/17/24 1004   01/17/24 1003  For home use only DME Hospital bed  Once       Question Answer Comment  Length of Need 6 Months   The above medical condition requires: Patient requires the ability to reposition frequently   Head must be elevated greater than: 30 degrees   Bed type Semi-electric      01/17/24 1004

## 2024-01-17 NOTE — Progress Notes (Addendum)
 Report given to receiving RN on 3W. Patient alert and oriented with no complaint at this time. Family at bedside, Ready to transport patient. All patient belongings with family.

## 2024-01-17 NOTE — Progress Notes (Signed)
 Re: Melysa Schroyer DOB:1945/11/13 Date:01/17/2024   To Whom It May Concern:  Please be advised that the above-named patient will require a short-term nursing home stay--anticipated 30 days or less for rehabilitation and strengthening. The plan is for home.

## 2024-01-17 NOTE — Progress Notes (Signed)
 Patient's daughter informed the RN about restarting patient medication Cloyde). Patient's daughter stated  My mother need to start on her medication because she was told to stop taking medication prior to surgery and now she's still not taking medication RN informed patient's daughter medication has to be continue/restart by MD, and verified by pharmacist before RN can administer medication. Patient's daughter stated understanding.

## 2024-01-17 NOTE — Evaluation (Signed)
 Physical Therapy Evaluation  Patient Details Name: Sarah Miller MRN: [id number] DOB: 1945-06-07 Today's Date: 01/17/2024  History of Present Illness  Pt is a 78 y/o female who presents s/p VP shunt for NPH on 01/16/2024. PMH significant for A-fib, CAD, CHF, COPD, L frozen shoulder, osteopenia, pulmonary HTN, R BBB, CKD III, DM II, urinary incontinence, L wrist fx.  Clinical Impression  Pt admitted with above diagnosis. At the time of PT eval, pt was able to demonstrate transfers and ambulation with up to mod assist and RW for support. +2 assist required for stair training. Daughter present and supportive. Daughter expresses difficulties with caring for pt at home PTA and is interested in SNF level rehab prior to return home. Feel this is reasonable given pt's current level of functional mobility, tolerance for functional activity, and cognition. Pt is currently at a high risk for falls. Pt currently with functional limitations due to the deficits listed below (see PT Problem List). Pt will benefit from skilled PT to increase their independence and safety with mobility to allow discharge to the venue listed below.          If plan is discharge home, recommend the following: A lot of help with walking and/or transfers;A lot of help with bathing/dressing/bathroom;Assistance with cooking/housework;Direct supervision/assist for medications management;Direct supervision/assist for financial management;Assist for transportation;Help with stairs or ramp for entrance;Supervision due to cognitive status   Can travel by private vehicle   Yes    Equipment Recommendations Wheelchair (measurements PT);Wheelchair cushion (measurements PT);Hospital bed;Rolling walker (2 wheels)  Recommendations for Other Services       Functional Status Assessment Patient has had a recent decline in their functional status and demonstrates the ability to make significant improvements in function in a reasonable and  predictable amount of time.     Precautions / Restrictions Precautions Precautions: Fall Recall of Precautions/Restrictions: Impaired Precaution/Restrictions Comments: Requires cues for safety throughout Restrictions Weight Bearing Restrictions Per Provider Order: No      Mobility  Bed Mobility Overal bed mobility: Needs Assistance Bed Mobility: Supine to Sit, Sit to Supine     Supine to sit: Min assist Sit to supine: Max assist   General bed mobility comments: Pt initiating transitions well. Light assist and significant increased time to get to EOB. Up to max assist for repositioning in the bed at end of session.    Transfers Overall transfer level: Needs assistance Equipment used: Rolling walker (2 wheels) Transfers: Sit to/from Stand, Bed to chair/wheelchair/BSC Sit to Stand: Mod assist   Step pivot transfers: Mod assist       General transfer comment: Assist for power up to full stand, and to gain/maintain standing balance. VC's for hand placement on seated surface for safety.    Ambulation/Gait Ambulation/Gait assistance: Mod assist Gait Distance (Feet): 75 Feet Assistive device: Rolling walker (2 wheels) Gait Pattern/deviations: Decreased stride length, Shuffle, Trunk flexed, Leaning posteriorly Gait velocity: Decreased Gait velocity interpretation: 1.31 - 2.62 ft/sec, indicative of limited community ambulator   General Gait Details: Short, shuffling gait pattern with several posterior LOB. Up to mod assist to recover.  Stairs Stairs: Yes Stairs assistance: Mod assist, +2 physical assistance, +2 safety/equipment Stair Management: One rail Left, Step to pattern, Forwards Number of Stairs: 1 (x5) General stair comments: HHA on the R and railing use on the L to simulate home environment. +2 assist for power up to next step and for general safety. Posterior lean throughout. Attempted sideways negotiation with 2 hands on L  rail and pt had increased difficulty  sequencing and advancing feet.  Wheelchair Mobility     Tilt Bed    Modified Rankin (Stroke Patients Only)       Balance Overall balance assessment: Needs assistance Sitting-balance support: Feet supported, Bilateral upper extremity supported Sitting balance-Leahy Scale: Poor   Postural control: Posterior lean Standing balance support: Bilateral upper extremity supported, During functional activity, Reliant on assistive device for balance Standing balance-Leahy Scale: Poor                               Pertinent Vitals/Pain Pain Assessment Pain Assessment: Faces Faces Pain Scale: Hurts little more Pain Location: R shoulder and abdomen where incision sites are Pain Descriptors / Indicators: Operative site guarding, Sore Pain Intervention(s): Limited activity within patient's tolerance, Monitored during session, Repositioned, Ice applied    Home Living Family/patient expects to be discharged to:: Private residence Living Arrangements: Children Available Help at Discharge: Family;Available PRN/intermittently Type of Home: House Home Access: Stairs to enter Entrance Stairs-Rails: None Entrance Stairs-Number of Steps: 2   Home Layout: One level Home Equipment: Toilet riser;Grab bars - tub/shower;Tub bench (renting transport chair, 3 wheeled walker)      Prior Function Prior Level of Function : Needs assist             Mobility Comments: Using 3 wheeled walker for ambulation all the time, transport chair for out of the house. Requires assist to power up to full stand, negotiate stairs, get in/out of the tub with tub bench ADLs Comments: Daughter assisting with all aspects of bathing, dressing, toileting.     Extremity/Trunk Assessment   Upper Extremity Assessment Upper Extremity Assessment: Generalized weakness    Lower Extremity Assessment Lower Extremity Assessment: RLE deficits/detail;LLE deficits/detail RLE Deficits / Details: Grossly 4/5  strength in quads, hamstrings, ankle DF; 4-/5 strength in hip flexors. RLE Sensation: decreased light touch;decreased proprioception LLE Deficits / Details: Grossly 4/5 strength in quads, hamstrings, ankle DF; 4-/5 strength in hip flexors.    Cervical / Trunk Assessment Cervical / Trunk Assessment: Other exceptions Cervical / Trunk Exceptions: VP shunt  Communication   Communication Communication: No apparent difficulties    Cognition Arousal: Alert Behavior During Therapy: Flat affect   PT - Cognitive impairments: History of cognitive impairments, Awareness, Memory, Attention, Sequencing, Problem solving, Safety/Judgement, Initiation                       PT - Cognition Comments: Cognitive deficits likely due to NPH - daughter reports after lumbar puncture cognition was improved Following commands: Impaired Following commands impaired: Follows one step commands inconsistently     Cueing Cueing Techniques: Verbal cues, Gestural cues     General Comments      Exercises     Assessment/Plan    PT Assessment Patient needs continued PT services  PT Problem List Decreased strength;Decreased activity tolerance;Decreased balance;Decreased mobility;Decreased knowledge of use of DME;Decreased knowledge of precautions;Decreased cognition;Decreased safety awareness;Pain       PT Treatment Interventions DME instruction;Stair training;Gait training;Functional mobility training;Therapeutic activities;Therapeutic exercise;Balance training;Patient/family education    PT Goals (Current goals can be found in the Care Plan section)  Acute Rehab PT Goals Patient Stated Goal: To be able to manage at home and walk better. Pt and daughter requesting SNF placement. PT Goal Formulation: With patient/family Time For Goal Achievement: 01/31/24 Potential to Achieve Goals: Good    Frequency Min 2X/week  Co-evaluation               AM-PAC PT 6 Clicks Mobility  Outcome  Measure Help needed turning from your back to your side while in a flat bed without using bedrails?: A Lot Help needed moving from lying on your back to sitting on the side of a flat bed without using bedrails?: A Lot Help needed moving to and from a bed to a chair (including a wheelchair)?: A Lot Help needed standing up from a chair using your arms (e.g., wheelchair or bedside chair)?: A Lot Help needed to walk in hospital room?: A Lot Help needed climbing 3-5 steps with a railing? : A Lot 6 Click Score: 12    End of Session Equipment Utilized During Treatment: Gait belt Activity Tolerance: Patient limited by fatigue Patient left: in bed;with call bell/phone within reach;with family/visitor present Nurse Communication: Mobility status PT Visit Diagnosis: Unsteadiness on feet (R26.81);Muscle weakness (generalized) (M62.81);Difficulty in walking, not elsewhere classified (R26.2)    Time: 9148-9061 PT Time Calculation (min) (ACUTE ONLY): 47 min   Charges:   PT Evaluation $PT Eval Moderate Complexity: 1 Mod PT Treatments $Gait Training: 23-37 mins PT General Charges $$ ACUTE PT VISIT: 1 Visit         Sarah Miller, PT, DPT Acute Rehabilitation Services Secure Chat Preferred Office: [phone]   Sarah JONETTA Miller 01/17/2024, 1:10 PM

## 2024-01-17 NOTE — [place] FL2 (Signed)
 [place]  MEDICAID FL2 LEVEL OF CARE FORM     IDENTIFICATION  Patient Name: Sarah Miller Birthdate: [birth year] Sex: female Admission Date (Current Location): 01/16/2024  [facility] and [place] Number:  Producer, Television/film/video and Address:  [facility] [place], [address] [place], [place] [address]      Provider Number: [id number]  Attending Physician Name and Address:  Lanis Pupa, MD  Relative Name and Phone Number:  Jerrell Gammons Daughter [phone]    Current Level of Care: Hospital Recommended Level of Care: Skilled Nursing Facility Prior Approval Number:    Date Approved/Denied:   PASRR Number: pending  Discharge Plan: SNF    Current Diagnoses: Patient Active Problem List   Diagnosis Date Noted   Normal pressure hydrocephalus (HCC) 01/16/2024   Cerebral ventriculomegaly 12/13/2023   Mild neurocognitive disorder 12/13/2023   Urinary incontinence    Frequent falls    Hardening of the aorta (main artery of the heart) 11/29/2023   Allergic rhinitis    Anxiety    Stage 3 chronic kidney disease    Dyslipidemia    Gastroesophageal reflux disease without esophagitis    Iron deficiency anemia    Neutrophilia    Obstructive sleep apnea    Osteopenia    Recurrent major depression in remission    Vitamin D  deficiency    Chronic diastolic CHF (congestive heart failure)    Pulmonary hypertension    Type 2 diabetes mellitus    Atrial fibrillation with RVR    COPD (chronic obstructive pulmonary disease)    CAD (coronary artery disease), native coronary artery    DOE (dyspnea on exertion) 02/03/2019   Morbid obesity 02/03/2019   Upper airway cough syndrome vs cough variant asthma 04/18/2018   Intrinsic sphincter deficiency (ISD) 02/06/2017   RBBB (right bundle branch block) 03/19/2013    Orientation RESPIRATION BLADDER Height & Weight     Self, Time, Situation, Place  Normal Incontinent Weight: 93.4 kg Height:  5' (152.4 cm)   BEHAVIORAL SYMPTOMS/MOOD NEUROLOGICAL BOWEL NUTRITION STATUS      Continent Diet (carb modified thin liquid)  AMBULATORY STATUS COMMUNICATION OF NEEDS Skin   Limited Assist Verbally Surgical wounds (right side of head)                       Personal Care Assistance Level of Assistance  Bathing, Dressing, Feeding Bathing Assistance: Limited assistance Feeding assistance: Independent Dressing Assistance: Limited assistance     Functional Limitations Info  Sight, Hearing, Speech Sight Info: Adequate Hearing Info: Adequate Speech Info: Adequate    SPECIAL CARE FACTORS FREQUENCY  OT (By licensed OT), PT (By licensed PT)     PT Frequency: 5x week OT Frequency: 5x week            Contractures Contractures Info: Not present    Additional Factors Info  Code Status, Allergies, Psychotropic, Insulin  Sliding Scale Code Status Info: Full Allergies Info: diazepam, peniclline, morphine, codeine, escitalopram Psychotropic Info: wellbutrin  300 mg daily, fluoxetine  20 mg daily Insulin  Sliding Scale Info: Novolog  0-15U TID with meals, Semglee  23U at bedtime       Current Medications (01/17/2024):  This is the current hospital active medication list Current Facility-Administered Medications  Medication Dose Route Frequency Provider Last Rate Last Admin   acetaminophen  (TYLENOL ) tablet 1,000 mg  1,000 mg Oral Q6H Nundkumar, Neelesh, MD   1,000 mg at 01/17/24 9371   albuterol  (PROVENTIL ) (2.5 MG/3ML) 0.083% nebulizer solution 2.5 mg  2.5 mg Inhalation Q6H PRN Lanis Pupa, MD       atorvastatin  (LIPITOR) tablet 10 mg  10 mg Oral QHS Nundkumar, Neelesh, MD   10 mg at 01/16/24 2025   buPROPion  (WELLBUTRIN  XL) 24 hr tablet 300 mg  300 mg Oral Daily Nundkumar, Neelesh, MD   300 mg at 01/17/24 9040   cephALEXin  (KEFLEX ) capsule 250 mg  250 mg Oral Daily Nundkumar, Neelesh, MD       cholecalciferol  (VITAMIN D3) 25 MCG (1000 UNIT) tablet 5,000 Units  5,000 Units Oral Daily Nundkumar,  Neelesh, MD   5,000 Units at 01/17/24 9040   FLUoxetine  (PROZAC ) capsule 20 mg  20 mg Oral Daily Nundkumar, Neelesh, MD   20 mg at 01/17/24 9040   insulin  aspart (novoLOG ) injection 0-15 Units  0-15 Units Subcutaneous TID WC Nundkumar, Neelesh, MD       insulin  glargine-yfgn (SEMGLEE ) injection 23 Units  23 Units Subcutaneous QHS Lanis Pupa, MD   23 Units at 01/16/24 2123   labetalol  (NORMODYNE ) injection 10-40 mg  10-40 mg Intravenous Q10 min PRN Lanis Pupa, MD       metFORMIN  (GLUCOPHAGE -XR) 24 hr tablet 500 mg  500 mg Oral BID WC Nundkumar, Neelesh, MD   500 mg at 01/17/24 9371   montelukast  (SINGULAIR ) tablet 10 mg  10 mg Oral QHS Nundkumar, Neelesh, MD   10 mg at 01/16/24 2025   ondansetron  (ZOFRAN ) tablet 4 mg  4 mg Oral Q4H PRN Lanis Pupa, MD       Or   ondansetron  (ZOFRAN ) injection 4 mg  4 mg Intravenous Q4H PRN Lanis Pupa, MD       pantoprazole  (PROTONIX ) EC tablet 40 mg  40 mg Oral QHS Nundkumar, Neelesh, MD   40 mg at 01/16/24 2025   promethazine  (PHENERGAN ) tablet 12.5-25 mg  12.5-25 mg Oral Q4H PRN Lanis Pupa, MD       traMADol  (ULTRAM ) tablet 50 mg  50 mg Oral Q6H PRN Lanis Pupa, MD   50 mg at 01/17/24 9567     Discharge Medications: Please see discharge summary for a list of discharge medications.  Relevant Imaging Results:  Relevant Lab Results:   Additional Information SSN#[id number]  Landry DELENA Senters, RN

## 2024-01-17 NOTE — TOC Progression Note (Signed)
 Transition of Care [facility]) - Progression Note    Patient Details  Name: Sarah Miller MRN: [id number] Date of Birth: [birth year]  Transition of Care [facility]) CM/SW Contact  Andrez JULIANNA George, RN Phone Number: 01/17/2024, 4:58 PM  Clinical Narrative:     Pt and daughter provided choice for SNF. They prefer [facility] if they will have  private room shortly.. If [facility] wont have a private they want [facility]. CM is waiting to hear from [facility] on private room. Pt will need insurance auth after bed confirmed.  IP Care management following.  Expected Discharge Plan: Skilled Nursing Facility Barriers to Discharge: SNF Pending bed offer               Expected Discharge Plan and Services       Living arrangements for the past 2 months: Single Family Home                                       Social Drivers of Health (SDOH) Interventions SDOH Screenings   Food Insecurity: No Food Insecurity (07/25/2023)  Housing: Low Risk  (07/25/2023)  Transportation Needs: No Transportation Needs (07/25/2023)  Utilities: Not At Risk (07/25/2023)  Depression (PHQ2-9): Low Risk  (08/26/2023)  Social Connections: Socially Isolated (07/25/2023)  Tobacco Use: Medium Risk (01/16/2024)    Readmission Risk Interventions     No data to display

## 2024-01-17 NOTE — Anesthesia Postprocedure Evaluation (Signed)
 Anesthesia Post Note  Patient: Sarah Miller  Procedure(s) Performed: SHUNT INSERTION VENTRICULAR-PERITONEAL     Patient location during evaluation: PACU Anesthesia Type: General Level of consciousness: awake and alert Pain management: pain level controlled Vital Signs Assessment: post-procedure vital signs reviewed and stable Respiratory status: spontaneous breathing, nonlabored ventilation, respiratory function stable and patient connected to nasal cannula oxygen Cardiovascular status: blood pressure returned to baseline and stable Postop Assessment: no apparent nausea or vomiting Anesthetic complications: no   No notable events documented.  Last Vitals:  Vitals:   01/17/24 0749 01/17/24 1145  BP: (!) 141/74 (!) 146/70  Pulse: 78 80  Resp: 20 19  Temp: 36.8 C 36.9 C  SpO2: 96% 97%    Last Pain:  Vitals:   01/17/24 1430  TempSrc:   PainSc: 5                  Kord Monette S

## 2024-01-17 NOTE — TOC Transition Note (Signed)
 Transition of Care [facility]) - Discharge Note   Patient Details  Name: Sarah Miller MRN: [id number] Date of Birth: March 31, 1945  Transition of [facility]) CM/SW Contact:  Landry DELENA Senters, RN Phone Number: 01/17/2024, 11:27 AM   Clinical Narrative:      RR:Hjpu instability, incontinence, confusion   Patient lives at home with daughter. After surgery, daughter expressed concerns about strength and mobility and the need for patient to go somewhere for therapy before returning home. Therapy eval recommended SNF for therapy before home. CM discussed with patient and daughter, and patient is agreeable to SNF. Patient info sent out to SNF's via hub.  CM will continue to follow.    Barriers to Discharge: SNF Pending bed offer   Patient Goals and CMS Choice            Discharge Placement                       Discharge Plan and Services Additional resources added to the After Visit Summary for                                       Social Drivers of Health (SDOH) Interventions SDOH Screenings   Food Insecurity: No Food Insecurity (07/25/2023)  Housing: Low Risk  (07/25/2023)  Transportation Needs: No Transportation Needs (07/25/2023)  Utilities: Not At Risk (07/25/2023)  Depression (PHQ2-9): Low Risk  (08/26/2023)  Social Connections: Socially Isolated (07/25/2023)  Tobacco Use: Medium Risk (01/16/2024)     Readmission Risk Interventions     No data to display

## 2024-01-18 LAB — GLUCOSE, CAPILLARY
Glucose-Capillary: 104 mg/dL — ABNORMAL HIGH (ref 70–99)
Glucose-Capillary: 128 mg/dL — ABNORMAL HIGH (ref 70–99)
Glucose-Capillary: 139 mg/dL — ABNORMAL HIGH (ref 70–99)
Glucose-Capillary: 119 mg/dL — ABNORMAL HIGH (ref 70–99)

## 2024-01-18 MED ORDER — ENOXAPARIN SODIUM 40 MG/0.4ML IJ SOSY
40.0000 mg | PREFILLED_SYRINGE | INTRAMUSCULAR | Status: DC
  Administered 2024-01-18 – 2024-01-20 (×3): 40 mg via SUBCUTANEOUS
  Filled 2024-01-18 (×3): qty 0.4

## 2024-01-18 NOTE — Evaluation (Signed)
 Occupational Therapy Evaluation Patient Details Name: Sarah Miller MRN: [id number] DOB: December 25, 1945 Today's Date: 01/18/2024   History of Present Illness   Pt is a 78 y/o female who presents s/p VP shunt for NPH on 01/16/2024. PMH significant for A-fib, CAD, CHF, COPD, L frozen shoulder, osteopenia, pulmonary HTN, R BBB, CKD III, DM II, urinary incontinence, L wrist fx.     Clinical Impressions Pt received in bed, agreeable for OT visit. AOX4, highly motivated and participatory. PTA, pt reports being indep with ADLs and mobility with 3-wheeled walker. Although, per PT eval 11/21, pt was needing assist with ADLs and transfers. Today, she presents with generalized weakness, reduced sequencing/insight, and balance deficits. Functionally, she needed max A for LB ADLs and setup-min A for UB ADLs. Mod A to stand and step to chair via RW. Heavily retropulsive initially upon standing and with frequent posterior lean in stance. RN updated on pt status.  Pt is currently functioning below baseline and would benefit from ongoing acute OT services to progress towards safe discharge and to facilitate return to prior level of function. Current recommendation is post-acute rehab (< 3 hours/day).     If plan is discharge home, recommend the following:   Two people to help with walking and/or transfers;A lot of help with bathing/dressing/bathroom;Assistance with cooking/housework;Direct supervision/assist for medications management;Direct supervision/assist for financial management;Assist for transportation     Functional Status Assessment   Patient has had a recent decline in their functional status and demonstrates the ability to make significant improvements in function in a reasonable and predictable amount of time.     Equipment Recommendations   Other (comment) (defer)     Recommendations for Other Services   PT consult     Precautions/Restrictions   Precautions Precautions:  Fall Recall of Precautions/Restrictions: Impaired Precaution/Restrictions Comments: safety cues throughout Restrictions Weight Bearing Restrictions Per Provider Order: No     Mobility Bed Mobility Overal bed mobility: Needs Assistance Bed Mobility: Supine to Sit     Supine to sit: Min assist, HOB elevated, Used rails     General bed mobility comments: assist for trunk elevation and cues for seated scooting towards EOB    Transfers Overall transfer level: Needs assistance Equipment used: Rolling walker (2 wheels) Transfers: Sit to/from Stand, Bed to chair/wheelchair/BSC Sit to Stand: Mod assist     Step pivot transfers: Mod assist     General transfer comment: Stood from bed, very retropulsive, needs cues for anterior WS. VC and step-by-step cueing for safe approach to chair.      Balance Overall balance assessment: Needs assistance Sitting-balance support: Single extremity supported, Feet supported, Bilateral upper extremity supported Sitting balance-Leahy Scale: Poor Sitting balance - Comments: occasional posterior LOB, needs up to min A to correct (worsens without UE support) Postural control: Posterior lean Standing balance support: Bilateral upper extremity supported, During functional activity, Reliant on assistive device for balance Standing balance-Leahy Scale: Poor Standing balance comment: reliant on RW, initially very retropulsive and con't with posterior lean when stepping to chair                           ADL either performed or assessed with clinical judgement   ADL Overall ADL's : Needs assistance/impaired Eating/Feeding: Independent   Grooming: Set up;Sitting;Wash/dry face Grooming Details (indicate cue type and reason): completed in chair         Upper Body Dressing : Contact guard assist;Sitting Upper Body Dressing Details (indicate  cue type and reason): gown exchange, occasional cues for sequencing Lower Body Dressing: Total  assistance;Sitting/lateral leans Lower Body Dressing Details (indicate cue type and reason): adjusting B socks, limited by LE weakness, body habitus, and decr functional reach/abdominal pain     Toileting- Clothing Manipulation and Hygiene: Maximal assistance Toileting - Clothing Manipulation Details (indicate cue type and reason): purewick intact; anticipate incr assist needed for other components of toileting 2/2 functional deficits     Functional mobility during ADLs: Moderate assistance;Rolling walker (2 wheels)       Vision Baseline Vision/History: 1 Wears glasses Ability to See in Adequate Light: 0 Adequate Patient Visual Report: No change from baseline Vision Assessment?: No apparent visual deficits     Perception         Praxis         Pertinent Vitals/Pain Pain Assessment Pain Assessment: Faces Faces Pain Scale: Hurts little more Pain Location: abdomen and R neck area near incisions Pain Descriptors / Indicators: Operative site guarding, Sore, Discomfort Pain Intervention(s): Limited activity within patient's tolerance, Monitored during session, Premedicated before session, Repositioned     Extremity/Trunk Assessment Upper Extremity Assessment Upper Extremity Assessment: Generalized weakness;Right hand dominant   Lower Extremity Assessment Lower Extremity Assessment: Defer to PT evaluation   Cervical / Trunk Assessment Cervical / Trunk Assessment: Other exceptions Cervical / Trunk Exceptions: VP shunt   Communication Communication Communication: No apparent difficulties   Cognition Arousal: Alert Behavior During Therapy: Flat affect (but participatory) Cognition: Cognition impaired     Awareness: Online awareness impaired Memory impairment (select all impairments): Short-term memory Attention impairment (select first level of impairment): Alternating attention Executive functioning impairment (select all impairments): Organization, Problem solving OT  - Cognition Comments: reduced insight and recall of most recent PLOF                 Following commands: Impaired Following commands impaired: Follows one step commands with increased time, Follows multi-step commands inconsistently     Cueing  General Comments   Cueing Techniques: Verbal cues;Gestural cues      Exercises     Shoulder Instructions      Home Living Family/patient expects to be discharged to:: Private residence Living Arrangements: Children Available Help at Discharge: Family;Available PRN/intermittently Type of Home: House Home Access: Stairs to enter [employer] of Steps: 2 Entrance Stairs-Rails: None Home Layout: One level     Bathroom Shower/Tub: Chief Strategy Officer: Standard     Home Equipment: Toilet riser;Grab bars - tub/shower;Tub bench (3-wheeled walker, renting transport chair)          Prior Functioning/Environment Prior Level of Function : Needs assist       Physical Assist : ADLs (physical)   ADLs (physical): Bathing;Dressing;Toileting;IADLs Mobility Comments: using 3-wheeled walker for all household mobility, transport chair for community mobility PTA; needs assist to stand and in/out of tub/shower ADLs Comments: assisting with bathing, dressing, toileting PTA    OT Problem List: Decreased strength;Impaired balance (sitting and/or standing);Decreased safety awareness;Pain;Obesity   OT Treatment/Interventions: Self-care/ADL training;Therapeutic exercise;DME and/or AE instruction;Energy conservation;Therapeutic activities;Cognitive remediation/compensation;Patient/family education;Balance training      OT Goals(Current goals can be found in the care plan section)   Acute Rehab OT Goals Patient Stated Goal: get better OT Goal Formulation: With patient Time For Goal Achievement: 02/01/24   OT Frequency:  Min 2X/week    Co-evaluation              AM-PAC OT 6 Clicks Daily Activity  Outcome Measure Help from another person eating meals?: None Help from another person taking care of personal grooming?: A Little Help from another person toileting, which includes using toliet, bedpan, or urinal?: A Lot Help from another person bathing (including washing, rinsing, drying)?: A Lot Help from another person to put on and taking off regular upper body clothing?: A Little Help from another person to put on and taking off regular lower body clothing?: Total 6 Click Score: 15   End of Session Equipment Utilized During Treatment: Gait belt;Rolling walker (2 wheels) Nurse Communication: Mobility status  Activity Tolerance: Patient tolerated treatment well Patient left: in chair;with call bell/phone within reach;with chair alarm set  OT Visit Diagnosis: Unsteadiness on feet (R26.81);Repeated falls (R29.6);History of falling (Z91.81);Muscle weakness (generalized) (M62.81);Pain Pain - Right/Left: Left Pain - part of body:  (neck, abd)                Time: 9164-9140 OT Time Calculation (min): 24 min Charges:  OT General Charges $OT Visit: 1 Visit OT Evaluation $OT Eval Moderate Complexity: 1 Mod  Alonni Heimsoth M. Bur[place], OTR/L [facility] Acute Rehabilitation Services [phone] Secure Chat Preferred  Rikki Burma 01/18/2024, 10:24 AM

## 2024-01-18 NOTE — Progress Notes (Signed)
    Providing Compassionate, Quality Care - Together   NEUROSURGERY PROGRESS NOTE     S: No issues overnight.    O: EXAM:  BP 138/71 (BP Location: Left Arm)   Pulse 75   Temp 98.5 F (36.9 C) (Oral)   Resp 17   Ht 5' (1.524 m)   Wt 93.4 kg   LMP  (LMP Unknown)   SpO2 96%   BMI 40.21 kg/m     Awake, alert, oriented  Speech fluent, appropriate  CNs grossly intact  Strength/sensation grossly intact BUE/BLE Incisions c/d/i   ASSESSMENT:  78 y.o. with s/p lap-assisted VPS for NPH.    PLAN: -Awaiting placement -Continue supportive care   Camie Pickle, [facility]

## 2024-01-19 LAB — GLUCOSE, CAPILLARY: Glucose-Capillary: 173 mg/dL — ABNORMAL HIGH (ref 70–99)

## 2024-01-19 NOTE — TOC Progression Note (Addendum)
 Transition of Care [facility]) - Progression Note    Patient Details  Name: ANTONIETTA LANSDOWNE MRN: [id number] Date of Birth: 10/21/1945  Transition of Care [facility]) CM/SW Contact  Urho Rio [place], [place] Phone Number: 01/19/2024, 10:59 AM  Clinical Narrative:   Message sent to [facility] admissions re private room availability on admission or soon after per family preference. Await response.   1540: Unable to reach [facility] admissions. Home and Community/[employer] auth request submitted (#[id number]) with facility choice pending. Will need to update insurance with facility Monday.   Julien Das, MSW, LCSW [phone] (coverage)      Expected Discharge Plan: Skilled Nursing Facility Barriers to Discharge: SNF Pending bed offer               Expected Discharge Plan and Services       Living arrangements for the past 2 months: Single Family Home                                       Social Drivers of Health (SDOH) Interventions SDOH Screenings   Food Insecurity: No Food Insecurity (01/18/2024)  Housing: Low Risk  (01/18/2024)  Transportation Needs: No Transportation Needs (01/18/2024)  Utilities: Not At Risk (01/18/2024)  Depression (PHQ2-9): Low Risk  (08/26/2023)  Social Connections: Socially Isolated (01/18/2024)  Tobacco Use: Medium Risk (01/16/2024)    Readmission Risk Interventions     No data to display

## 2024-01-19 NOTE — Progress Notes (Signed)
    Providing Compassionate, Quality Care - Together   NEUROSURGERY PROGRESS NOTE     S: No issues overnight.    O: EXAM:  BP (!) 142/71 (BP Location: Left Arm)   Pulse 77   Temp 98.5 F (36.9 C) (Oral)   Resp 18   Ht 5' (1.524 m)   Wt 93.4 kg   LMP  (LMP Unknown)   SpO2 98%   BMI 40.21 kg/m     Awake, alert, oriented  Speech fluent, appropriate  CNs grossly intact  Strength/sensation grossly intact BUE/BLE    ASSESSMENT:  78 y.o. s/p lap-assisted VPS for NPH.     PLAN: -Awaiting placement -Continue supportive care   Camie Pickle, [facility]

## 2024-01-20 LAB — GLUCOSE, CAPILLARY
Glucose-Capillary: 166 mg/dL — ABNORMAL HIGH (ref 70–99)
Glucose-Capillary: 108 mg/dL — ABNORMAL HIGH (ref 70–99)
Glucose-Capillary: 147 mg/dL — ABNORMAL HIGH (ref 70–99)
Glucose-Capillary: 113 mg/dL — ABNORMAL HIGH (ref 70–99)
Glucose-Capillary: 109 mg/dL — ABNORMAL HIGH (ref 70–99)
Glucose-Capillary: 95 mg/dL (ref 70–99)
Glucose-Capillary: 179 mg/dL — ABNORMAL HIGH (ref 70–99)
Glucose-Capillary: 89 mg/dL (ref 70–99)
Glucose-Capillary: 90 mg/dL (ref 70–99)

## 2024-01-20 NOTE — Care Management Important Message (Signed)
 Important Message  Patient Details  Name: Sarah Miller MRN: [id number] Date of Birth: 05-14-1945   Important Message Given:  Yes - Medicare IM     Jennie Laneta Dragon 01/20/2024, 1:54 PM

## 2024-01-20 NOTE — Progress Notes (Signed)
 Physical Therapy Treatment Patient Details Name: Sarah Miller MRN: [id number] DOB: [birth year] Today's Date: 01/20/2024   History of Present Illness Pt is a 78 y/o female who presents s/p VP shunt for NPH on 01/16/2024. PMH significant for A-fib, CAD, CHF, COPD, L frozen shoulder, osteopenia, pulmonary HTN, R BBB, CKD III, DM II, urinary incontinence, L wrist fx.    PT Comments  Pt received in supine and agreeable to PT session. Upon initial stand, pt demonstrated bout of posterior lean, which she recovered with modA. She is progressing well with PT goals, and able to ambulate 2 bouts of 60' with seated rest break in between. She did require modA at times to recover from staggering both R and L. Noted scissoring of feet in attempt to catch balance, and initiated education on safer balance strategies while walking, as well as inclusion of lateral stepping in gait today. Continuing to recommend post-acute rehab <3hrs/day to improve safety and tolerance for functional mobility. Acute PT to follow.    If plan is discharge home, recommend the following: A little help with walking and/or transfers;A little help with bathing/dressing/bathroom;Assistance with cooking/housework;Assist for transportation;Help with stairs or ramp for entrance   Can travel by private vehicle     Yes  Equipment Recommendations  Rolling walker (2 wheels);Wheelchair (measurements PT);Wheelchair cushion (measurements PT)    Recommendations for Other Services       Precautions / Restrictions Precautions Precautions: Fall Recall of Precautions/Restrictions: Intact Restrictions Weight Bearing Restrictions Per Provider Order: No     Mobility  Bed Mobility Overal bed mobility: Needs Assistance Bed Mobility: Supine to Sit     Supine to sit: Supervision     General bed mobility comments: Pt came supine>sit with no physical assist required, just inc time. HOB elevated    Transfers Overall transfer level: Needs  assistance Equipment used: Rolling walker (2 wheels) Transfers: Sit to/from Stand Sit to Stand: Mod assist           General transfer comment: Requires VC for hand placement to push up from bed, and modA for power up into standing. Pt with initial posterior LOB once in standing, but able recover with modA.    Ambulation/Gait Ambulation/Gait assistance: Contact guard assist, Mod assist Gait Distance (Feet): 60 Feet (x2) Assistive device: Rolling walker (2 wheels) Gait Pattern/deviations: Decreased stride length, Trunk flexed, Staggering left, Staggering right, Decreased stance time - right Gait velocity: Decreased Gait velocity interpretation: 1.31 - 2.62 ft/sec, indicative of limited community ambulator   General Gait Details: Pt with 3 bouts of staggering today, 2 bouts requiring modA to maintain upright. Noted staggering to both R and L, dec R stance time, and inc lateral sway with walking.   Stairs             Wheelchair Mobility     Tilt Bed    Modified Rankin (Stroke Patients Only)       Balance Overall balance assessment: Needs assistance Sitting-balance support: Single extremity supported, No upper extremity supported Sitting balance-Leahy Scale: Fair Sitting balance - Comments: No physical assist to maintain upright Postural control: Posterior lean Standing balance support: Bilateral upper extremity supported, During functional activity, Reliant on assistive device for balance Standing balance-Leahy Scale: Poor Standing balance comment: Reliant on RW support                            Communication Communication Communication: No apparent difficulties  Cognition Arousal: Alert Behavior During  Therapy: WFL for tasks assessed/performed   PT - Cognitive impairments: History of cognitive impairments, Safety/Judgement                       PT - Cognition Comments: Pt pleasant and oriented today. Slight deficits regarding problem  solving and safety during mobility Following commands: Intact      Cueing Cueing Techniques: Verbal cues, Gestural cues  Exercises      General Comments General comments (skin integrity, edema, etc.): Also practiced lateral stepping x10 reps each way, with pt demonstrating initial difficulty navigating RW and performing lateral steps. Had one LOB when stepping to her L, which she recovered with modA.      Pertinent Vitals/Pain Pain Assessment Pain Assessment: Faces Faces Pain Scale: Hurts little more Pain Location: R temple; shooting pain twice that cleared quickly Pain Descriptors / Indicators: Grimacing Pain Intervention(s): Limited activity within patient's tolerance, Monitored during session    Home Living                          Prior Function            PT Goals (current goals can now be found in the care plan section) Acute Rehab PT Goals PT Goal Formulation: With patient/family Time For Goal Achievement: 01/31/24 Potential to Achieve Goals: Good Progress towards PT goals: Progressing toward goals    Frequency    Min 2X/week      PT Plan      Co-evaluation              AM-PAC PT 6 Clicks Mobility   Outcome Measure  Help needed turning from your back to your side while in a flat bed without using bedrails?: A Little Help needed moving from lying on your back to sitting on the side of a flat bed without using bedrails?: A Little Help needed moving to and from a bed to a chair (including a wheelchair)?: A Lot Help needed standing up from a chair using your arms (e.g., wheelchair or bedside chair)?: A Lot Help needed to walk in hospital room?: A Lot Help needed climbing 3-5 steps with a railing? : A Lot 6 Click Score: 14    End of Session Equipment Utilized During Treatment: Gait belt Activity Tolerance: Patient tolerated treatment well Patient left: in chair;with call bell/phone within reach;with chair alarm set;with family/visitor  present Nurse Communication: Mobility status PT Visit Diagnosis: Unsteadiness on feet (R26.81);Muscle weakness (generalized) (M62.81);Difficulty in walking, not elsewhere classified (R26.2)     Time: 8964-8890 PT Time Calculation (min) (ACUTE ONLY): 34 min  Charges:    $Therapeutic Activity: 23-37 mins PT General Charges $$ ACUTE PT VISIT: 1 Visit                     Georges Victorio, SPT    Eveleigh Crumpler 01/20/2024, 1:10 PM

## 2024-01-20 NOTE — TOC Progression Note (Signed)
 Transition of Care [facility]) - Progression Note    Patient Details  Name: Sarah Miller MRN: [id number] Date of Birth: August 04, 1945  Transition [facility] Care [facility]) CM/SW Contact  Almarie CHRISTELLA [place], [place] Phone Number: 01/20/2024, 3:32 PM  Clinical Narrative:   CSW contacted [facility], they do not have any private rooms available. CSW met with patient and daughter at bedside to update, they would still like to go with [facility]. CSW updated [facility] and [facility], and updated [facility] with SNF choice in the portal. Patient was approved, but [facility] can't admit until tomorrow. Patient and daughter made aware. CSW to follow.    Expected Discharge Plan: Skilled Nursing Facility Barriers to Discharge: Continued Medical Work up, English As A Second Language Teacher               Expected Discharge Plan and Services       Living arrangements for the past 2 months: Single Family Home                                       Social Drivers of Health (SDOH) Interventions SDOH Screenings   Food Insecurity: No Food Insecurity (01/18/2024)  Housing: Low Risk  (01/18/2024)  Transportation Needs: No Transportation Needs (01/18/2024)  Utilities: Not At Risk (01/18/2024)  Depression (PHQ2-9): Low Risk  (08/26/2023)  Social Connections: Socially Isolated (01/18/2024)  Tobacco Use: Medium Risk (01/16/2024)    Readmission Risk Interventions     No data to display

## 2024-01-20 NOTE — Progress Notes (Signed)
    Providing Compassionate, Quality Care - Together   NEUROSURGERY PROGRESS NOTE     S: No issues overnight.    O: EXAM:  BP 139/63 (BP Location: Left Arm)   Pulse 78   Temp 98.7 F (37.1 C) (Oral)   Resp 18   Ht 5' (1.524 m)   Wt 93.4 kg   LMP  (LMP Unknown)   SpO2 98%   BMI 40.21 kg/m     Awake, alert, oriented  Speech fluent, appropriate  CNs grossly intact  Strength/sensation grossly intact BUE/BLE Incisions c/d/i   ASSESSMENT:  78 y.o. s/p lap-assisted VPS for NPH.     PLAN: -Awaiting placement. -Continue supportive care.   Camie Pickle, [facility]

## 2024-01-21 LAB — GLUCOSE, CAPILLARY
Glucose-Capillary: 162 mg/dL — ABNORMAL HIGH (ref 70–99)
Glucose-Capillary: 117 mg/dL — ABNORMAL HIGH (ref 70–99)

## 2024-01-21 MED ORDER — TRAMADOL HCL 50 MG PO TABS: 50.0000 mg | ORAL_TABLET | Freq: Four times a day (QID) | ORAL | 0 refills | Status: AC | PRN

## 2024-01-21 NOTE — Progress Notes (Signed)
 Occupational Therapy Treatment Patient Details Name: JAYMEE TILSON MRN: [id number] DOB: 08/19/1945 Today's Date: 01/21/2024   History of present illness Pt is a 78 y/o female who presents s/p VP shunt for NPH on 01/16/2024. PMH significant for A-fib, CAD, CHF, COPD, L frozen shoulder, osteopenia, pulmonary HTN, R BBB, CKD III, DM II, urinary incontinence, L wrist fx.   OT comments  Pt greeted in bed, agreeable for OT visit. Daughter at bedside. Pt making great progress with OT today, she was mod A to stand from the bed for initial retropulsion and posterior lean. Corrects with VC/TC for anterior WS. Ambulated in room with RW and min A, cued to maintain appropriate proximity to AD. Performed standing grooming with CGA and needed min A for toileting for safety/stability. Pt requesting to return to bed, anticipating discharge to rehab later today. OT to continue to follow while in house.      If plan is discharge home, recommend the following:  Two people to help with walking and/or transfers;A lot of help with bathing/dressing/bathroom;Assistance with cooking/housework;Direct supervision/assist for medications management;Direct supervision/assist for financial management;Assist for transportation   Equipment Recommendations  Other (comment) (defer)    Recommendations for Other Services      Precautions / Restrictions Precautions Precautions: Fall Recall of Precautions/Restrictions: Intact Precaution/Restrictions Comments: safety cues throughout Restrictions Weight Bearing Restrictions Per Provider Order: No       Mobility Bed Mobility Overal bed mobility: Needs Assistance Bed Mobility: Supine to Sit, Sit to Supine     Supine to sit: Min assist Sit to supine: Contact guard assist   General bed mobility comments: assist for trunk elevation, used bed rails    Transfers Overall transfer level: Needs assistance Equipment used: Rolling walker (2 wheels) Transfers: Sit to/from  Stand Sit to Stand: Mod assist           General transfer comment: Stood from bed height with VC for hand placement and forward lean/WS in prepration to stand.     Balance Overall balance assessment: Needs assistance Sitting-balance support: No upper extremity supported, Feet supported Sitting balance-Leahy Scale: Fair Sitting balance - Comments: seated EOB, no overt LOB Postural control: Posterior lean Standing balance support: Bilateral upper extremity supported, During functional activity, Reliant on assistive device for balance Standing balance-Leahy Scale: Poor Standing balance comment: heavily reliant on RW, initially retropulsive, but improves with mobility & VC for anterior WS/lean                           ADL either performed or assessed with clinical judgement   ADL Overall ADL's : Needs assistance/impaired     Grooming: Contact guard assist;Wash/dry hands;Standing               Lower Body Dressing: Minimal assistance;Moderate assistance;Sitting/lateral leans Lower Body Dressing Details (indicate cue type and reason): daughter removing pt's socks, pt needs min A to fully slide R heel into [employer] Transfer: Minimal assistance;Cueing for safety;Ambulation;Regular Toilet;Cueing for sequencing;Rolling walker (2 wheels);Grab bars Toilet Transfer Details (indicate cue type and reason): cues for safe approach and controlled descent, VC to use grab bar for improved body mechanics when standing from toilet Toileting- Clothing Manipulation and Hygiene: Minimal assistance;Sitting/lateral lean Toileting - Clothing Manipulation Details (indicate cue type and reason): assist for clothing mgmt up/stability in stance; cued to maintain unilateral UE support on RW for improved safety/balance     Functional mobility during ADLs: Minimal assistance;Rolling walker (2 wheels);Cueing for  safety      Extremity/Trunk Assessment              Vision        Perception     Praxis     Communication Communication Communication: No apparent difficulties   Cognition Arousal: Alert Behavior During Therapy: WFL for tasks assessed/performed Cognition: Cognition impaired   Orientation impairments: Time (pt reporting she kept forgetting what time/day it was) Awareness: Online awareness impaired Memory impairment (select all impairments): Short-term memory Attention impairment (select first level of impairment): Alternating attention Executive functioning impairment (select all impairments): Organization, Problem solving OT - Cognition Comments: very forgetful, impaired working memory                 Following commands: Intact Following commands impaired: Follows one step commands with increased time, Follows multi-step commands inconsistently      Cueing   Cueing Techniques: Verbal cues, Gestural cues  Exercises      Shoulder Instructions       General Comments daughter present    Pertinent Vitals/ Pain       Pain Assessment Pain Assessment: Faces Faces Pain Scale: Hurts a little bit Pain Location: R neck Pain Descriptors / Indicators: Grimacing Pain Intervention(s): Limited activity within patient's tolerance, Monitored during session  Home Living                                          Prior Functioning/Environment              Frequency  Min 2X/week        Progress Toward Goals  OT Goals(current goals can now be found in the care plan section)        Plan      Co-evaluation                 AM-PAC OT 6 Clicks Daily Activity     Outcome Measure   Help from another person eating meals?: None Help from another person taking care of personal grooming?: A Little Help from another person toileting, which includes using toliet, bedpan, or urinal?: A Lot Help from another person bathing (including washing, rinsing, drying)?: A Lot Help from another person to put on and  taking off regular upper body clothing?: A Little Help from another person to put on and taking off regular lower body clothing?: A Lot 6 Click Score: 16    End of Session Equipment Utilized During Treatment: Gait belt;Rolling walker (2 wheels)  OT Visit Diagnosis: Unsteadiness on feet (R26.81);Repeated falls (R29.6);History of falling (Z91.81);Muscle weakness (generalized) (M62.81);Pain   Activity Tolerance Patient tolerated treatment well   Patient Left in bed;with call bell/phone within reach;with bed alarm set;with family/visitor present   Nurse Communication Mobility status        Time: 1115-1140 OT Time Calculation (min): 25 min  Charges: OT General Charges $OT Visit: 1 Visit OT Treatments $Self Care/Home Management : 23-37 mins  Javyn Havlin M. [place], OTR/L [facility] Services [phone] Secure Chat Preferred  Yaritzy Huser 01/21/2024, 1:45 PM

## 2024-01-21 NOTE — TOC Transition Note (Signed)
 Transition of Care [facility]) - Discharge Note   Patient Details  Name: Sarah Miller MRN: [id number] Date of Birth: [birth year]  Transition of [facility]) CM/SW Contact:  Almarie CHRISTELLA Goodie, LCSW Phone Number: 01/21/2024, 1:40 PM   Clinical Narrative:   CSW updated MD, patient stable for discharge today, and [facility] has a bed available. CSW sent discharge information, confirmed bed is ready for patient. CSW met with patient and daughter at bedside, daughter to transport.  Nurse to call report to [phone], Room 504B.    Final next level of care: Skilled Nursing Facility Barriers to Discharge: Barriers Resolved   Patient Goals and CMS Choice            Discharge Placement              Patient chooses bed at: [facility] Patient to be transferred to facility by: Daughter Name of family member notified: Self, Daughter at bedside Patient and family notified of of transfer: 01/21/24  Discharge Plan and Services Additional resources added to the After Visit Summary for                                       Social Drivers of Health (SDOH) Interventions SDOH Screenings   Food Insecurity: No Food Insecurity (01/18/2024)  Housing: Low Risk  (01/18/2024)  Transportation Needs: No Transportation Needs (01/18/2024)  Utilities: Not At Risk (01/18/2024)  Depression (PHQ2-9): Low Risk  (08/26/2023)  Social Connections: Socially Isolated (01/18/2024)  Tobacco Use: Medium Risk (01/16/2024)     Readmission Risk Interventions     No data to display

## 2024-01-21 NOTE — Discharge Summary (Signed)
 Patient ID: Sarah Miller MRN: [id number] DOB/AGE: 09/09/1945 78 y.o.  Admit date: 01/16/2024 Discharge date: 01/21/2024  Admission Diagnoses: Normal pressure hydrocephalus [facility]) [G91.2]   Discharge Diagnoses: Same   Discharged Condition: Stable  Hospital Course:  Sarah Miller is a 78 y.o. female who was admitted following an VP shunt insertion for NPH on 01/16/24. They were recovered in PACU and transferred to the floor. Hospital course was uncomplicated. She is tolerating a normal diet, progressing with therapies, pain well managed, had had BM. Pt stable for discharge today to SNF. Pt to f/u in office for routine post op visit. Pt/daughter are in agreement w/ plan.    Discharge Exam: Blood pressure 132/70, pulse (!) 109, temperature 98.3 F (36.8 C), resp. rate 16, height 5' (1.524 m), weight 93.4 kg, SpO2 96%.  Disposition: Discharge disposition: 03-Skilled Nursing Facility       Discharge Instructions     Discharge wound care:   Complete by: As directed    Remove cranial staples on 01/30/24.      Allergies as of 01/21/2024       Reactions   Diazepam Other (See Comments)   Escitalopram Other (See Comments)   Morphine And Codeine Nausea Only   Other    hydocan cough syrup causes nausea   Penicillins Rash   Tolerated Cephalosporin 12/28/2019.        Medication List     PAUSE taking these medications    apixaban  5 MG Tabs tablet Wait to take this until: January 23, 2024 Commonly known as: ELIQUIS  Take 1 tablet (5 mg total) by mouth 2 (two) times daily.       TAKE these medications    albuterol  108 (90 Base) MCG/ACT inhaler Commonly known as: VENTOLIN  HFA Inhale 1-2 puffs into the lungs every 6 (six) hours as needed for wheezing or shortness of breath.   atorvastatin  10 MG tablet Commonly known as: LIPITOR Take 10 mg by mouth at bedtime.   Basaglar  KwikPen 100 UNIT/ML Inject 20 Units into the skin at bedtime. What changed: how much to  take   buPROPion  300 MG 24 hr tablet Commonly known as: WELLBUTRIN  XL Take 300 mg by mouth daily.   cephALEXin  250 MG capsule Commonly known as: KEFLEX  Take 250 mg by mouth daily.   FLUoxetine  20 MG capsule Commonly known as: PROZAC  Take 20 mg by mouth daily.   fluticasone  50 MCG/ACT nasal spray Commonly known as: FLONASE  SPRAY 2 SPRAYS INTO EACH NOSTRIL EVERY DAY   furosemide  20 MG tablet Commonly known as: LASIX  Take 1 tablet (20 mg total) by mouth daily as needed for fluid or edema.   metFORMIN  500 MG 24 hr tablet Commonly known as: GLUCOPHAGE -XR Take 500 mg by mouth in the morning and at bedtime.   montelukast  10 MG tablet Commonly known as: SINGULAIR  TAKE 1 TABLET BY MOUTH EVERYDAY AT BEDTIME   Mounjaro 7.5 MG/0.5ML Pen Generic drug: tirzepatide Inject 7.5 mg into the skin once a week.   ondansetron  4 MG tablet Commonly known as: ZOFRAN  Take 4 mg by mouth every 8 (eight) hours as needed for nausea or vomiting.   OneTouch Ultra Test test strip Generic drug: glucose blood SMARTSIG:50 Daily   traMADol  50 MG tablet Commonly known as: ULTRAM  Take 1 tablet (50 mg total) by mouth every 6 (six) hours as needed for moderate pain (pain score 4-6).   Vitamin D3 125 MCG (5000 UT) Caps Take 5,000 Units by mouth daily.  Durable Medical Equipment  (From admission, onward)           Start     Ordered   01/17/24 1004  For home use only DME standard manual wheelchair with seat cushion  Once       Comments: Patient suffers from weakness which impairs their ability to perform daily activities like bathing, dressing, and grooming in the home.  A walker will not resolve issue with performing activities of daily living. A wheelchair will allow patient to safely perform daily activities. Patient can safely propel the wheelchair in the home or has a caregiver who can provide assistance. Length of need Lifetime. Accessories: elevating leg rests (ELRs), wheel  locks, extensions and anti-tippers.   01/17/24 1004   01/17/24 1003  For home use only DME Hospital bed  Once       Question Answer Comment  Length of Need 6 Months   The above medical condition requires: Patient requires the ability to reposition frequently   Head must be elevated greater than: 30 degrees   Bed type Semi-electric      01/17/24 1004              Discharge Care Instructions  (From admission, onward)           Start     Ordered   01/21/24 0000  Discharge wound care:       Comments: Remove cranial staples on 01/30/24.   01/21/24 1215            Contact information for follow-up providers     Lanis Pupa, MD. Call.   Specialty: Neurosurgery Why: As needed, If symptoms worsen Contact information: [address] [place] [address] [phone]              Contact information for after-discharge care     Destination     [facility] .   Service: Skilled Nursing Contact information: [address] [place]  [address] [phone]                     Signed: Lafern Brinkley CAYLIN [employer] 01/21/2024, 12:16 PM

## 2024-01-21 NOTE — Progress Notes (Signed)
    Providing Compassionate, Quality Care - Together   NEUROSURGERY PROGRESS NOTE     S: No issues overnight.    O: EXAM:  BP (!) 137/106 (BP Location: Left Arm)   Pulse 74   Temp 98.6 F (37 C)   Resp 16   Ht 5' (1.524 m)   Wt 93.4 kg   LMP  (LMP Unknown)   SpO2 96%   BMI 40.21 kg/m     Awake, alert, oriented  Speech fluent, appropriate  CNs grossly intact  Strength/sensation grossly intact BUE/BLE Incisions c/d/i   ASSESSMENT:  78 y.o. s/p lap-assisted VPS for NPH.     PLAN: -Awaiting placement. -Continue supportive care.   Camie Pickle, [facility]

## 2024-01-27 ENCOUNTER — Telehealth: Payer: Self-pay

## 2024-01-27 NOTE — Telephone Encounter (Signed)
 CT from 12/25/2019   Small appendage with a very long neck Max 20/ AVG 19.5/ Depth 14 Likely use a 24mm device Inf/Mid TSP

## 2024-01-29 ENCOUNTER — Ambulatory Visit: Admitting: Speech Pathology

## 2024-02-05 ENCOUNTER — Ambulatory Visit: Admitting: Speech Pathology

## 2024-02-12 ENCOUNTER — Ambulatory Visit: Attending: Family Medicine | Admitting: Speech Pathology

## 2024-02-17 ENCOUNTER — Encounter: Payer: Self-pay | Admitting: Neurology

## 2024-02-17 ENCOUNTER — Ambulatory Visit: Admitting: Neurology

## 2024-02-17 VITALS — BP 121/73 | HR 82 | Ht 60.0 in | Wt 202.4 lb

## 2024-02-17 DIAGNOSIS — G912 (Idiopathic) normal pressure hydrocephalus: Secondary | ICD-10-CM

## 2024-02-17 DIAGNOSIS — R296 Repeated falls: Secondary | ICD-10-CM

## 2024-02-17 NOTE — Patient Instructions (Signed)
 Wishing you all the best.  Continue with follow-up with Dr. Lanis and physical therapy  2. Referral will be sent to [facility] Neu[facility] for second opinion on frequent falls with no improvement after VP shunt  3. Follow-up in 6 months, call for any changes

## 2024-02-17 NOTE — Progress Notes (Unsigned)
 "  NEUROLOGY FOLLOW UP OFFICE NOTE  Sarah Miller [id number] [birth year]  HISTORY OF PRESENT ILLNESS: I had the pleasure of seeing Sarah Miller in follow-up in the neurology clinic on 02/17/2024.  She is again accompanied by her daughter who helps supplement the history today. The patient was last seen 3 months ago for subacute gait difficulties over a 3-4 month period. Records and images were personally reviewed where available.  She saw neurosurgeon Dr. Rosslyn in 11/2023 , I went over the clinical picture with her and her daughter and this most certainly sounds very much like normal pressure hydrocephalus.  I did not test her gait because she is so unstable that she has high risk of falls but according to the daughter who describe it she can barely put 1 step in front of another.   I reviewed the MRI and this shows ventriculomegaly without any T2 transependymal flow.then obtained a second opinion from Dr. Lanis. She ultimately underwent shunt placement for a diagnosis of NPH last 01/16/24. On her last visit on 12/11, shunt drainage increased.     Neuropsych 11/2023:  Mild Neurocognitive Disorder (mild cognitive impairment) at the present time.   Regarding the cause for ongoing cognitive impairment, I do have concerns surrounding normal pressure hydrocephalus (NPH). Cerebral ventriculomegaly appears prominent by my view of her most recent brain MRI this past July. Clinically, she has described a notable change in her balance and gait, including frequent falls, as well as urinary incontinence ongoing for the past year or so. Current testing patterns favoring frontal-subcortical dysfunction and verbal memory align well with what would be expected with this sort of presentation. Overall, ventriculomegaly with the combination of balance instability, incontinence, and cognitive impairment would represent a fairly classic clinical presentation of NPH. Further work-up surrounding these concerns is  strongly recommended.    Outside of NPH, prominent balance instability and frequent falling in a backwards direction (as reported by her daughter) could raise concern for progressive supranuclear palsy (PSP). Sarah Miller and her daughter also expressed concerns surrounding tremulous experiences. These were very subtle presently and largely postural/action based. Current testing patterns could be exhibited in PSP presentations. However, there is no report of oculomotor dysfunction and neuroimaging would favor NPH given ventricular enlargement and no reported midbrain atrophy. This could represent another differential diagnosis to consider should members of her medical team not feel that NPH best characterizes ongoing symptoms.    There could also be a vascular contribution to cognitive impairment given recent neuroimaging suggesting mild to moderate microvascular ischemic disease. Current testing does not suggest compelling evidence for symptomatic Alzheimer's disease at the present time. Continued medical monitoring will be important moving forward.     This is a 78 year old right-handed woman with a history DM2, OSA on CPAP, atrial fibrillation on anticoagulation, CAD, COPD, depression, anxiety, presenting for evaluation of dizziness. Her daughter Sarah Miller supplements the history. She was referred in July 2025 for dizziness and frequent falls. She reported dizziness worse when looking to the left. I personally reviewed MRI brain with and without contrast done 08/2023 which did not show any acute changes, there was mild to moderate chronic microvascular disease. Ventricles appear slightly enlarged with note of diffuse cerebral and cerebellar volume loss. On initial vestibular therapy evaluation in 08/2023, all positional testing was negative however her VOR and HIT were both impaired indicative of a vestibular hypofunction, L>R. They report that after atrial fibrillation was treated, the dizziness resolved.  Main concern today is the  progressive gait dysfunction despite improvement in dizziness. She has had a significant amount of falls. Sarah Miller reports balance changes started around a year ago, worsening in the Spring/Summer of 2025, significantly worse in June where she had several falls. Sarah Miller recalls that 3-4 months ago she was walking independently but not well, however now she cannot even get out of bed without assistance. She is off balance, she falls backward or to the side. Sarah Miller describes her movements as like a baby and seemingly worse on the left side.  She has a zombie walk and does not seem to engage her core. She has to hold on to the shower bar as her daughter washes her. If she focuses on it, she can do better with walking however she needs a 1-person assist, unable to use a walker because she would fall backward. She has a prescription for a wheelchair now. She denies any numbness/tingling/weakness, no neck or back pain. She had an MRI cervical spine without contrast earlier this month with no myelopathy seen, there was multilevel cervical spondylosis with mild spinal canal stenosis at C3-4 and C4-5, severe left C4 foraminal stenosis, mild chronic compression deformities involving the superior endplates of T1 and T2 without retropulsion. On her most recent PT session 9/19, they reported a fall off the toilet as she was reaching for the bar and landed on the floor. She was noted to have abnormal gait, decreased balance, decreased knowledge of condition, decreased knowledge of use of DME, decreased strength, and dizziness.   Cognitive changes also started in Spring/Summer 2025. She had been living alone independently up until her admission for atrial fibrillation in June 2025. Sarah Miller started noticing she would forget her medications, Sarah Miller would text and she would report she forgot them so she took over medications in June. Sarah Miller had to move in with her at that time and noticed  progressive cognitive decline. She repeats herself, Sarah Miller has told her 15 times already where they were going today. She lost her email, forgets her passwords, she can't type anything. She calls things different names, she was supposed to prick her finger to check blood then went on to use the wrong medication. Sarah Miller reports focus is a big thing, there is no focus. She is also less inhibited with family, no filter in the past year. She is impulsive. She continues to do well managing finances, most are on autopay, Sarah Miller checks behind her. She stopped driving locally within the past year, she denied getting lost, it was getting more difficult to get down to the stairs to her car. There is no family history of cognitive changes or similar gait difficulties. No history of head injuries. She started to have urinary incontinence over a year ago, she recently started wearing Depends. She has interrupted sleep due to urination, getting up to use the bathroom however still needing to change the sheets daily. Sarah Miller repots she lays down or sits most of the day, on her phone or TV. She has been unable to do PT home exercises due to their fear of falling.  She denies any headaches. She had monocular vertical diplopia on the left eye that improved with glasses. No dysarthria/dysphagia, anosmia, visual hallucinations. She has had a mild tremor in both hands, she feels the tremor on the left hand started after left shoulder injury in 2021. She previously worked in clinical biochemist and thinks she had PTSD from that but also from the recurrent falls.   Laboratory Data: Lab Results  Component Value  Date   TSH 1.307 07/26/2023   Lab Results  Component Value Date   VITAMINB12 329 11/22/2023     PAST MEDICAL HISTORY: Past Medical History:  Diagnosis Date   Allergic rhinitis    Anxiety    Atrial fibrillation with RVR    CAD (coronary artery disease), native coronary artery    coronary CTA done  12/25/2019 showing coronary calcium  score of 60 which was 72nd percentile for age and sex matched controls along with 50 to 70% proximal RCA that was nondominant, less than 25% mid LAD as well as mid left circumflex.   Cerebral ventriculomegaly 12/13/2023   Cervical arthritis    [name], S/P injection with good results.    Chronic diastolic CHF (congestive heart failure)    COPD (chronic obstructive pulmonary disease)    with emphysema     DOE (dyspnea on exertion) 02/03/2019   - PFTs 02/02/2019 unable to do spirometry, mod decreased lung vol with ERV 10% c/w obesity effects      Dyslipidemia    Early menopause    Frequent falls    Frozen shoulder    H/O frozen shoulder and rotator cuff tendonititis, L s/p injection   Gastroesophageal reflux disease without esophagitis    Hardening of the aorta (main artery of the heart) 11/29/2023   History of melanoma    1978 and 1979   Intrinsic sphincter deficiency (ISD) 02/06/2017   Added automatically from request for surgery [id number]     Iron deficiency anemia    Mild neurocognitive disorder 12/13/2023   Morbid obesity 02/03/2019   PFTs 02/02/2019 with erv 10% c/w body habitus     Neutrophilia    Obstructive sleep apnea    Osteopenia    PONV (postoperative nausea and vomiting)    Pulmonary hypertension    RBBB (right bundle branch block) 03/19/2013   Recurrent major depression in remission    Stage 3 chronic kidney disease    Type 2 diabetes mellitus    Upper airway cough syndrome vs cough variant asthma 04/18/2018   Onset mid sept 2019  - Allergy  profile  02/28/2018    IgE  312  RAST neg     - 04/17/2018 d/c breo/ max rx for gerd/ cyclical cough   - PFTs 02/02/2019 unable to do spirometry, mod decreased lung vol with ERV 10% c/w obesity effects   - 08/05/2019  After extensive coaching inhaler device,  effectiveness =    75% (short Ti)      Urinary incontinence    Vitamin D  deficiency     MEDICATIONS: Medications Ordered Prior to  Encounter[1]  ALLERGIES: Allergies[2]  FAMILY HISTORY: Family History  Problem Relation Age of Onset   Cancer Mother        ovarian cancer   Hypertension Father    Diabetes Father    Heart attack Father    Cancer Father        testicular   CAD Father    Heart disease Father    Hyperlipidemia Father    Heart attack Brother 58   CAD Brother    Heart disease Brother    CAD Paternal Grandfather    Hypertension Sister    Cancer Other    Hyperlipidemia Other    Heart disease Other     SOCIAL HISTORY: Social History   Socioeconomic History   Marital status: Divorced    Spouse name: Not on file   Number of children: 1   Years of education:  12   Highest education level: High school graduate  Occupational History   Occupation: Retired  Tobacco Use   Smoking status: Former    Current packs/day: 0.00    Average packs/day: 0.8 packs/day for 53.0 years (39.8 ttl pk-yrs)    Types: Cigarettes    Start date: 02/27/1963    Quit date: 02/27/2016    Years since quitting: 7.9   Smokeless tobacco: Never  Vaping Use   Vaping status: Never Used  Substance and Sexual Activity   Alcohol use: Not Currently    Comment: rare   Drug use: No   Sexual activity: Not Currently    Birth control/protection: Post-menopausal, Surgical    Comment: Hysterectomy  Other Topics Concern   Not on file  Social History Narrative   Living w/ daughter, with cat-1 story, 1 step   Right hand   Caffeine 1 cup coffee daily      Social Drivers of Health   Tobacco Use: Medium Risk (02/17/2024)   Patient History    Smoking Tobacco Use: Former    Smokeless Tobacco Use: Never    Passive Exposure: Not on Actuary Strain: Not on file  Food Insecurity: No Food Insecurity (01/18/2024)   Epic    Worried About Programme Researcher, Broadcasting/film/video in the Last Year: Never true    Ran Out of Food in the Last Year: Never true  Transportation Needs: No Transportation Needs (01/18/2024)   Epic    Lack of  Transportation (Medical): No    Lack of Transportation (Non-Medical): No  Physical Activity: Not on file  Stress: Not on file  Social Connections: Socially Isolated (01/18/2024)   Social Connection and Isolation Panel    Frequency of Communication with Friends and Family: More than three times a week    Frequency of Social Gatherings with Friends and Family: Three times a week    Attends Religious Services: Never    Active Member of Clubs or Organizations: No    Attends Banker Meetings: Never    Marital Status: Divorced  Catering Manager Violence: Not At Risk (01/18/2024)   Epic    Fear of Current or Ex-Partner: No    Emotionally Abused: No    Physically Abused: No    Sexually Abused: No  Depression (PHQ2-9): Low Risk (08/26/2023)   Depression (PHQ2-9)    PHQ-2 Score: 0  Alcohol Screen: Not on file  Housing: Low Risk (01/18/2024)   Epic    Unable to Pay for Housing in the Last Year: No    Number of Times Moved in the Last Year: 0    Homeless in the Last Year: No  Utilities: Not At Risk (01/18/2024)   Epic    Threatened with loss of utilities: No  Health Literacy: Not on file     PHYSICAL EXAM: Vitals:   02/17/24 1336  BP: 121/73  Pulse: 82  SpO2: 95%   General: No acute distress Head:  Normocephalic/atraumatic Skin/Extremities: No rash, no edema Neurological Exam: alert and oriented to person, place, and time. No aphasia or dysarthria. Fund of knowledge is appropriate.  Recent and remote memory are intact.  Attention and concentration are normal.   Cranial nerves: Pupils equal, round. Extraocular movements intact with no nystagmus. Visual fields full.  No facial asymmetry.  Motor: Bulk and tone normal, muscle strength 5/5 throughout with no pronator drift.   Finger to nose testing intact.  Gait narrow-based and steady, able to tandem walk adequately.  Romberg  negative.   IMPRESSION: This is a 78 year old right-handed woman with a history DM2, OSA on CPAP,  atrial fibrillation on anticoagulation, CAD, COPD, depression, anxiety, presenting for evaluation of dizziness. They report that the dizziness has improved with treatment of atrial fibrillation, however she has had progressive gait dysfunction over the past 3 months, now unable to ambulate independently. She has also had a rather acute cognitive decline now requiring assistance with ADLs. She has had urinary incontinence in the past year. Etiology of symptoms unclear, her exam does not show any parkinsonian signs or supranuclear signs. No significant neuropathy noted as well. MRI cervical spine no myelopathy, no acute changes on brain MRI. Although her brain MRI report did not indicate ventriculomegaly, the ventricles appear slightly more enlarged although there is also diffuse volume loss. With triad of symptoms, she will be referred for NPH evaluation with Dr. Junjua. Check levels of B vitamins (B1, B6, B12), vitamin E, vitamin D . She will be scheduled for Neurocognitive evaluation to further evaluate cognitive changes. Follow-up after tests, call for any changes.    Thank you for allowing me to participate in *** care.  Please do not hesitate to call for any questions or concerns.  The duration of this appointment visit was *** minutes of face-to-face time with the patient.  Greater than 50% of this time was spent in counseling, explanation of diagnosis, planning of further management, and coordination of care.   Darice Shivers, M.D.   CC: ***      [1]  Current Outpatient Medications on File Prior to Visit  Medication Sig Dispense Refill   albuterol  (VENTOLIN  HFA) 108 (90 Base) MCG/ACT inhaler Inhale 1-2 puffs into the lungs every 6 (six) hours as needed for wheezing or shortness of breath.     apixaban  (ELIQUIS ) 5 MG TABS tablet Take 1 tablet (5 mg total) by mouth 2 (two) times daily. 180 tablet 1   atorvastatin  (LIPITOR) 10 MG tablet Take 10 mg by mouth at bedtime.  3   buPROPion  (WELLBUTRIN   XL) 300 MG 24 hr tablet Take 300 mg by mouth daily.      cephALEXin  (KEFLEX ) 250 MG capsule Take 250 mg by mouth daily.     Cholecalciferol  (VITAMIN D3) 125 MCG (5000 UT) CAPS Take 5,000 Units by mouth daily.     FLUoxetine  (PROZAC ) 20 MG capsule Take 20 mg by mouth daily.     fluticasone  (FLONASE ) 50 MCG/ACT nasal spray SPRAY 2 SPRAYS INTO EACH NOSTRIL EVERY DAY (Patient not taking: Reported on 01/10/2024) 48 mL 1   furosemide  (LASIX ) 20 MG tablet Take 1 tablet (20 mg total) by mouth daily as needed for fluid or edema. (Patient not taking: Reported on 01/10/2024) 30 tablet 0   Insulin  Glargine (BASAGLAR  KWIKPEN) 100 UNIT/ML Inject 20 Units into the skin at bedtime. (Patient taking differently: Inject 23 Units into the skin at bedtime.)     metFORMIN  (GLUCOPHAGE -XR) 500 MG 24 hr tablet Take 500 mg by mouth in the morning and at bedtime.  1   montelukast  (SINGULAIR ) 10 MG tablet TAKE 1 TABLET BY MOUTH EVERYDAY AT BEDTIME 90 tablet 0   MOUNJARO 7.5 MG/0.5ML Pen Inject 7.5 mg into the skin once a week.     ondansetron  (ZOFRAN ) 4 MG tablet Take 4 mg by mouth every 8 (eight) hours as needed for nausea or vomiting.     ONETOUCH ULTRA TEST test strip SMARTSIG:50 Daily     traMADol  (ULTRAM ) 50 MG tablet Take 1 tablet (  50 mg total) by mouth every 6 (six) hours as needed for moderate pain (pain score 4-6). 30 tablet 0   No current facility-administered medications on file prior to visit.  [2]  Allergies Allergen Reactions   Diazepam Other (See Comments)   Escitalopram Other (See Comments)   Morphine And Codeine Nausea Only   Other     hydocan cough syrup causes nausea   Penicillins Rash    Tolerated Cephalosporin 12/28/2019.    "

## 2024-02-18 ENCOUNTER — Encounter: Payer: Self-pay | Admitting: Neurology

## 2024-02-26 ENCOUNTER — Ambulatory Visit: Admitting: Speech Pathology

## 2024-03-04 ENCOUNTER — Ambulatory Visit: Admitting: Speech Pathology

## 2024-03-11 ENCOUNTER — Ambulatory Visit: Admitting: Speech Pathology

## 2024-03-11 NOTE — Progress Notes (Unsigned)
 "   Cardiology Clinic Note   Patient Name: Sarah Miller Date of Encounter: 03/11/2024  Primary Care Provider:  Amin, Saad, MD Primary Cardiologist:  Wilbert Bihari, MD  Patient Profile    Sarah Miller 79 year old female presents to the clinic today for follow-up of her atrial fibrillation.  Past Medical History    Past Medical History:  Diagnosis Date   Allergic rhinitis    Anxiety    Atrial fibrillation with RVR    CAD (coronary artery disease), native coronary artery    coronary CTA done 12/25/2019 showing coronary calcium  score of 60 which was 72nd percentile for age and sex matched controls along with 50 to 70% proximal RCA that was nondominant, less than 25% mid LAD as well as mid left circumflex.   Cerebral ventriculomegaly 12/13/2023   Cervical arthritis    [name], S/P injection with good results.    Chronic diastolic CHF (congestive heart failure)    COPD (chronic obstructive pulmonary disease)    with emphysema     DOE (dyspnea on exertion) 02/03/2019   - PFTs 02/02/2019 unable to do spirometry, mod decreased lung vol with ERV 10% c/w obesity effects      Dyslipidemia    Early menopause    Frequent falls    Frozen shoulder    H/O frozen shoulder and rotator cuff tendonititis, L s/p injection   Gastroesophageal reflux disease without esophagitis    Hardening of the aorta (main artery of the heart) 11/29/2023   History of melanoma    1978 and 1979   Intrinsic sphincter deficiency (ISD) 02/06/2017   Added automatically from request for surgery [id number]     Iron deficiency anemia    Mild neurocognitive disorder 12/13/2023   Morbid obesity 02/03/2019   PFTs 02/02/2019 with erv 10% c/w body habitus     Neutrophilia    Obstructive sleep apnea    Osteopenia    PONV (postoperative nausea and vomiting)    Pulmonary hypertension    RBBB (right bundle branch block) 03/19/2013   Recurrent major depression in remission    Stage 3 chronic kidney disease     Type 2 diabetes mellitus    Upper airway cough syndrome vs cough variant asthma 04/18/2018   Onset mid sept 2019  - Allergy  profile  02/28/2018    IgE  312  RAST neg     - 04/17/2018 d/c breo/ max rx for gerd/ cyclical cough   - PFTs 02/02/2019 unable to do spirometry, mod decreased lung vol with ERV 10% c/w obesity effects   - 08/05/2019  After extensive coaching inhaler device,  effectiveness =    75% (short Ti)      Urinary incontinence    Vitamin D  deficiency    Past Surgical History:  Procedure Laterality Date   ABDOMINAL HYSTERECTOMY     CATARACT EXTRACTION W/ INTRAOCULAR LENS IMPLANT Bilateral    FRACTURE SURGERY Left    wrist   MELANOMA EXCISION     REVERSE SHOULDER ARTHROPLASTY Left 12/28/2019   Procedure: REVERSE SHOULDER ARTHROPLASTY;  Surgeon: [name], MD;  Location: [facility];  Service: Orthopedics;  Laterality: Left;   VENTRICULOPERITONEAL SHUNT N/A 01/16/2024   Procedure: SHUNT INSERTION VENTRICULAR-PERITONEAL;  Surgeon: Lanis Pupa, MD;  Location: [facility] OR;  Service: Neurosurgery;  Laterality: N/A;  LAPAROSCOPIC ASSISTED VP SHUNT PLACEMENT DOW ON CALL    Allergies  Allergies  Allergen Reactions   Diazepam Other (See Comments)   Escitalopram Other (See Comments)  Morphine And Codeine Nausea Only   Other     hydocan cough syrup causes nausea   Penicillins Rash    Tolerated Cephalosporin 12/28/2019.     History of Present Illness    Sarah Miller has a PMH of OSA, COPD, hypertension, microcytic anemia, type 2 diabetes, and atrial fibrillation with RVR.  She presented to the emergency department on 07/25/2023 and was discharged on 07/29/2023.  She reported an episode 2 days prior where she became short of breath.  Since her episode she noted persistent shortness of breath, extreme fatigue, and dizziness.  She also noted fluttering type heartbeats in her neck.  She denied chest pain and fevers.  She had presented to her primary care physician's office and was sent to  the emergency department.  She denied history of heart issues.  She was noted to be in atrial fibrillation with RVR.  She was initiated on diltiazem  gtt. for rate control.  She was admitted to [facility] from [facility] emergency department.  She also was placed on amiodarone  and apixaban .  Her echocardiogram showed an LVEF of 65%.  She presented to the clinic 6/25 for follow-up evaluation and stated she was feeling somewhat better.  She continued to have shortness of breath.  This appeared to be chronic.  She presented with her daughter.  We reviewed her emergency department visit and recommendations.  She was not be a good candidate for apixaban  due to her history of falls and instability.  She routinely used a walker.  She was cardiac unaware.  Her EKG today shows sinus rhythm 71 bpm.  We discussed possibility of Watchman implant.  I gave her the lower extremity support stocking sheet, instructions for avoiding triggers for A-fib, place referral to EP, and plan follow-up in 3 to 4 months.  She was seen in follow-up by Dr. Shlomo on 11/15/2023.  She continued to follow-up with sleep MD at [facility] sleep.  She was referred to EP for consideration of Watchman closure device.  She continued to be euvolemic.  She denied palpitations.  She was continued on apixaban .  She presents to the clinic today for follow-up evaluation.  She states***.  Today she denies chest pain, increased shortness of breath, lower extremity edema, fatigue, palpitations, melena, hematuria, hemoptysis, diaphoresis, weakness, presyncope, syncope, orthopnea, and PND.    Home Medications    Prior to Admission medications   Medication Sig Start Date End Date Taking? Authorizing Provider  albuterol  (VENTOLIN  HFA) 108 (90 Base) MCG/ACT inhaler Inhale 1-2 puffs into the lungs every 6 (six) hours as needed for wheezing or shortness of breath. 07/23/23   [provider]  amiodarone  (PACERONE ) 200 MG tablet Take 2 tablets (400 mg  total) 2 (two) times daily for 14 days, THEN 2 tablets (400 mg total) daily for 14 days, THEN 1 tablet (200 mg total) daily for 16 days. 07/29/23 09/11/23  Vann, Jessica U, DO  apixaban  (ELIQUIS ) 5 MG TABS tablet Take 1 tablet (5 mg total) by mouth 2 (two) times daily. 07/29/23   Vann, Jessica U, DO  atorvastatin  (LIPITOR) 10 MG tablet Take 10 mg by mouth daily. 09/02/17   [provider]  buPROPion  (WELLBUTRIN  XL) 300 MG 24 hr tablet Take 300 mg by mouth daily.     [provider]  desvenlafaxine  (PRISTIQ ) 25 MG 24 hr tablet Take 25 mg by mouth daily. 07/08/23   [provider]  ferrous sulfate 325 (65 FE) MG EC tablet Take 325 mg by  mouth 2 (two) times daily with a meal.    [provider]  fluticasone  (FLONASE ) 50 MCG/ACT nasal spray SPRAY 2 SPRAYS INTO EACH NOSTRIL EVERY DAY 03/17/20   Olalere, Jennet A, MD  furosemide  (LASIX ) 20 MG tablet Take 1 tablet (20 mg total) by mouth daily as needed for fluid or edema. 07/29/23   Vann, Jessica U, DO  Insulin  Glargine (BASAGLAR  KWIKPEN) 100 UNIT/ML Inject 20 Units into the skin at bedtime. 07/29/23   Vann, Jessica U, DO  metFORMIN  (GLUCOPHAGE -XR) 500 MG 24 hr tablet Take 500 mg by mouth in the morning and at bedtime. 500 mg in morning and 500 mg at night 10/16/17   [provider]  montelukast  (SINGULAIR ) 10 MG tablet TAKE 1 TABLET BY MOUTH EVERYDAY AT BEDTIME 02/08/20   Wert, Michael B, MD  MOUNJARO 2.5 MG/0.5ML Pen Inject 2.5 mg into the skin once a week. 07/09/23   [provider]  ondansetron  (ZOFRAN ) 4 MG tablet Take 4 mg by mouth every 8 (eight) hours as needed for nausea or vomiting. 07/19/23   [provider]    Family History    Family History  Problem Relation Age of Onset   Cancer Mother        ovarian cancer   Hypertension Father    Diabetes Father    Heart attack Father    Cancer Father        testicular   CAD Father    Heart disease Father    Hyperlipidemia Father    Heart attack  Brother 74   CAD Brother    Heart disease Brother    CAD Paternal Grandfather    Hypertension Sister    Cancer Other    Hyperlipidemia Other    Heart disease Other    She indicated that her mother is deceased. She indicated that her father is deceased. She indicated that both of her sisters are alive. She indicated that her brother is deceased. She indicated that her maternal grandmother is deceased. She indicated that her maternal grandfather is deceased. She indicated that her paternal grandmother is deceased. She indicated that her paternal grandfather is deceased. She indicated that the status of her other is unknown.  Social History    Social History   Socioeconomic History   Marital status: Divorced    Spouse name: Not on file   Number of children: 1   Years of education: 12   Highest education level: High school graduate  Occupational History   Occupation: Retired  Tobacco Use   Smoking status: Former    Current packs/day: 0.00    Average packs/day: 0.8 packs/day for 53.0 years (39.8 ttl pk-yrs)    Types: Cigarettes    Start date: 02/27/1963    Quit date: 02/27/2016    Years since quitting: 8.0   Smokeless tobacco: Never  Vaping Use   Vaping status: Never Used  Substance and Sexual Activity   Alcohol use: Not Currently    Comment: rare   Drug use: No   Sexual activity: Not Currently    Birth control/protection: Post-menopausal, Surgical    Comment: Hysterectomy  Other Topics Concern   Not on file  Social History Narrative   Living w/ daughter, with cat-1 story, 1 step   Right hand   Caffeine 1 cup coffee daily      Social Drivers of Health   Tobacco Use: Medium Risk (02/18/2024)   Patient History    Smoking Tobacco Use: Former  Smokeless Tobacco Use: Never    Passive Exposure: Not on file  Financial Resource Strain: Not on file  Food Insecurity: No Food Insecurity (01/18/2024)   Epic    Worried About Programme Researcher, Broadcasting/film/video in the Last Year: Never true     Ran Out of Food in the Last Year: Never true  Transportation Needs: No Transportation Needs (01/18/2024)   Epic    Lack of Transportation (Medical): No    Lack of Transportation (Non-Medical): No  Physical Activity: Not on file  Stress: Not on file  Social Connections: Socially Isolated (01/18/2024)   Social Connection and Isolation Panel    Frequency of Communication with Friends and Family: More than three times a week    Frequency of Social Gatherings with Friends and Family: Three times a week    Attends Religious Services: Never    Active Member of Clubs or Organizations: No    Attends Banker Meetings: Never    Marital Status: Divorced  Catering Manager Violence: Not At Risk (01/18/2024)   Epic    Fear of Current or Ex-Partner: No    Emotionally Abused: No    Physically Abused: No    Sexually Abused: No  Depression (PHQ2-9): Low Risk (08/26/2023)   Depression (PHQ2-9)    PHQ-2 Score: 0  Alcohol Screen: Not on file  Housing: Low Risk (01/18/2024)   Epic    Unable to Pay for Housing in the Last Year: No    Number of Times Moved in the Last Year: 0    Homeless in the Last Year: No  Utilities: Not At Risk (01/18/2024)   Epic    Threatened with loss of utilities: No  Health Literacy: Not on file     Review of Systems    General:  No chills, fever, night sweats or weight changes.  Cardiovascular:  No chest pain, dyspnea on exertion, edema, orthopnea, palpitations, paroxysmal nocturnal dyspnea. Dermatological: No rash, lesions/masses Respiratory: No cough, dyspnea Urologic: No hematuria, dysuria Abdominal:   No nausea, vomiting, diarrhea, bright red blood per rectum, melena, or hematemesis Neurologic:  No visual changes, wkns, changes in mental status. All other systems reviewed and are otherwise negative except as noted above.  Physical Exam    VS:  LMP  (LMP Unknown)  , BMI There is no height or weight on file to calculate BMI. GEN: Well nourished, well  developed, in no acute distress. HEENT: normal. Neck: Supple, no JVD, carotid bruits, or masses. Cardiac: RRR, no murmurs, rubs, or gallops. No clubbing, cyanosis, edema.  Radials/DP/PT 2+ and equal bilaterally.  Respiratory:  Respirations regular and unlabored, clear to auscultation bilaterally. GI: Soft, nontender, nondistended, BS + x 4. MS: no deformity or atrophy. Skin: warm and dry, no rash. Neuro:  Strength and sensation are intact. Psych: Normal affect.  Accessory Clinical Findings    Recent Labs: 07/25/2023: Pro Brain Natriuretic Peptide 2,279.0 07/26/2023: Magnesium 2.1; TSH 1.307 08/27/2023: ALT 19 01/13/2024: BUN 18; Creatinine, Ser 0.94; Hemoglobin 11.5; Platelets 362; Potassium 3.9; Sodium 141   Recent Lipid Panel    Component Value Date/Time   CHOL 126 07/27/2023 0557   TRIG 168 (H) 07/27/2023 0557   HDL 38 (L) 07/27/2023 0557   CHOLHDL 3.3 07/27/2023 0557   VLDL 34 07/27/2023 0557   LDLCALC 54 07/27/2023 0557    No BP recorded.  {Refresh Note OR Click here to enter BP  :1}***    ECG personally reviewed by me today-  Echocardiogram 07/26/2023  IMPRESSIONS     1. Left ventricular ejection fraction, by estimation, is 60 to 65%. The  left ventricle has normal function. The left ventricle has no regional  wall motion abnormalities. Left ventricular diastolic parameters are  consistent with Grade I diastolic  dysfunction (impaired relaxation).   2. Right ventricular systolic function is mildly reduced. The right  ventricular size is normal. There is moderately elevated pulmonary artery  systolic pressure. The estimated right ventricular systolic pressure is  55.3 mmHg.   3. The mitral valve is normal in structure. No evidence of mitral valve  regurgitation. No evidence of mitral stenosis.   4. Tricuspid valve regurgitation is moderate.   5. The aortic valve is normal in structure. Aortic valve regurgitation is  not visualized. No aortic stenosis is  present.   6. The inferior vena cava is dilated in size with >50% respiratory  variability, suggesting right atrial pressure of 8 mmHg.   FINDINGS   Left Ventricle: Left ventricular ejection fraction, by estimation, is 60  to 65%. The left ventricle has normal function. The left ventricle has no  regional wall motion abnormalities. Definity  contrast agent was given IV  to delineate the left ventricular   endocardial borders. The left ventricular internal cavity size was normal  in size. There is no left ventricular hypertrophy. Left ventricular  diastolic parameters are consistent with Grade I diastolic dysfunction  (impaired relaxation).   Right Ventricle: The right ventricular size is normal. No increase in  right ventricular wall thickness. Right ventricular systolic function is  mildly reduced. There is moderately elevated pulmonary artery systolic  pressure. The tricuspid regurgitant  velocity is 3.44 m/s, and with an assumed right atrial pressure of 8 mmHg,  the estimated right ventricular systolic pressure is 55.3 mmHg.   Left Atrium: Left atrial size was normal in size.   Right Atrium: Right atrial size was normal in size.   Pericardium: There is no evidence of pericardial effusion.   Mitral Valve: The mitral valve is normal in structure. No evidence of  mitral valve regurgitation. No evidence of mitral valve stenosis.   Tricuspid Valve: The tricuspid valve is normal in structure. Tricuspid  valve regurgitation is moderate . No evidence of tricuspid stenosis.   Aortic Valve: The aortic valve is normal in structure. Aortic valve  regurgitation is not visualized. No aortic stenosis is present. Aortic  valve mean gradient measures 7.0 mmHg. Aortic valve peak gradient measures  13.4 mmHg.   Pulmonic Valve: The pulmonic valve was normal in structure. Pulmonic valve  regurgitation is not visualized. No evidence of pulmonic stenosis.   Aorta: The aortic root is normal in  size and structure.   Venous: The inferior vena cava is dilated in size with greater than 50%  respiratory variability, suggesting right atrial pressure of 8 mmHg.   IAS/Shunts: No atrial level shunt detected by color flow Doppler.      Assessment & Plan   1.  Atrial fibrillation-heart rate today***.  She reports compliance with apixaban .  Denies bleeding issues.  Denies recent trauma.  Denies recent episodes of accelerated or irregular heartbeat.  Main symptom was increased shortness of breath and activity intolerance.  Patient does not wish to pursue Watchman implant. Heart healthy low-sodium diet Avoid triggers caffeine, chocolate, EtOH, dehydration etc.-reviewed  Continue apixaban   Coronary artery disease-no chest pain today.  Coronary CTA 10/21 showed a coronary calcium  score of 60.  No aspirin  on apixaban . Maintain physical activity Continue atorvastatin   Continue to monitor  OSA-continues compliance with CPAP.    Avoid supine sleeping Sleep hygiene instructions Continue CPAP use  Obesity-weight today 2**28 pounds. Reduced calorie diet-reviewed Maintain physical activity Continue weight loss Continue Mounjaro  COPD-breathing stable. Increase physical activity as tolerated Continue current medical therapy Follows with PCP  Type 2 diabetes-glucose 191 on 07/28/2023. Continue metformin  Carb modified diet Follows with PCP  Disposition: Follow-up with Dr. Shlomo or me in 4-6 months.   Sarah HERO. Earlene Bjelland NP-C     03/11/2024, 7:39 AM [facility] [address] Office [phone] Fax [phone]    I spent 15*** minutes examining this patient, reviewing medications, and using patient centered shared decision making involving their cardiac care.   I spent  20 minutes reviewing past medical history,  medications, and prior cardiac tests.  "

## 2024-03-13 ENCOUNTER — Ambulatory Visit: Attending: General Practice | Admitting: General Practice

## 2024-03-18 ENCOUNTER — Other Ambulatory Visit: Admitting: Licensed Clinical Social Worker

## 2024-03-18 ENCOUNTER — Telehealth: Payer: Self-pay | Admitting: Nurse Practitioner

## 2024-03-18 ENCOUNTER — Telehealth: Payer: Self-pay

## 2024-03-18 ENCOUNTER — Telehealth: Payer: Self-pay | Admitting: *Deleted

## 2024-03-18 DIAGNOSIS — E119 Type 2 diabetes mellitus without complications: Secondary | ICD-10-CM

## 2024-03-18 NOTE — Patient Outreach (Signed)
 Referral received via e-mail from patients primary care provider requesting assistance from LCSW for level of care.  Marjorie Ao, BSW, CDP [place]  [facility] - [facility] Manager Population Health Direct Dial: [phone]  Fax: [phone]

## 2024-03-18 NOTE — Progress Notes (Signed)
 Complex Care Management Note Care Guide Note  03/18/2024 Name: Sarah Miller MRN: [id number] DOB: 1946/01/19   Complex Care Management Outreach Attempts: An unsuccessful telephone outreach was attempted today to offer the patient information about available complex care management services.  Follow Up Plan:  Additional outreach attempts will be made to offer the patient complex care management information and services.   Encounter Outcome:  No Answer  Harlene Satterfield  [facility]  [facility], [facility] Guide  Direct Dial: [phone]  Fax [phone]

## 2024-03-18 NOTE — Telephone Encounter (Signed)
 PT's daughter called to cancel appt; will call back to reschedule.

## 2024-03-18 NOTE — Progress Notes (Signed)
 Complex Care Management Note  Care Guide Note 03/18/2024 Name: RONIQUA KINTZ MRN: [id number] DOB: 03-08-1945  ANJELITA SHEAHAN is a 79 y.o. year old female who sees Caleen Dirks, MD for primary care. I reached out to Kate LELON Sequin daughter Vincent,Camille by phone today to offer complex care management services.  Ms. Munroe daughter Jerrell Gammons was given information about Complex Care Management services today including:   The Complex Care Management services include support from the care team which includes your Nurse Care Manager, Clinical Social Worker, or Pharmacist.  The Complex Care Management team is here to help remove barriers to the health concerns and goals most important to you. Complex Care Management services are voluntary, and the patient may decline or stop services at any time by request to their care team member.   Complex Care Management Consent Status: Patient daughter Jerrell Gammons agreed to services and verbal consent obtained.   Follow up plan:  Telephone appointment with complex care management team member scheduled for:  03/18/24  Encounter Outcome:  Patient Scheduled  Harlene Satterfield  [facility]  [facility], [facility] Guide  Direct Dial: [phone]  Fax [phone]

## 2024-03-19 ENCOUNTER — Other Ambulatory Visit

## 2024-03-19 ENCOUNTER — Ambulatory Visit: Admitting: Oncology

## 2024-03-19 NOTE — Patient Instructions (Signed)
 Visit Information  Thank you for taking time to visit with me today. Please don't hesitate to contact me if I can be of assistance to you before our next scheduled appointment.  Our next appointment is by telephone on 03/25/24 at 3pm Please call the care guide team at [phone] if you need to cancel or reschedule your appointment.   Following is a copy of your care plan:   Goals Addressed   None     Please call 911 if you are experiencing a Mental Health or Behavioral Health Crisis or need someone to talk to.  Patient verbalized understanding of Care plan and visit instructions communicated this visit  Cena Ligas, LCSW Clinical Social Worker [employer]

## 2024-03-19 NOTE — Patient Outreach (Signed)
 LCSW called and spoke to patients daughter Santana on the phone. LCSW introduced self and explained reason for the call. Santana explained that patient went to [facility] for SNF and then transferred to [facility] for long term care, under the medicaid pending status. Santana states she  was unhappy with her care there and took patient home. Santana is living with patient to provide care. Patient is also receiving PT/OT at home. Santana hopes that when patient receives full LTC medicaid she will be able to go into a long term care facility. LCSW and Santana discussed calling the medicaid office to determine if theres anything else that is needed for the process. LCSW will met with Santana next week to complete initial assessment.   Cena Ligas, LCSW Clinical Social Worker [facility] Population Health

## 2024-03-20 ENCOUNTER — Inpatient Hospital Stay: Payer: Self-pay | Admitting: Nurse Practitioner

## 2024-03-20 ENCOUNTER — Inpatient Hospital Stay: Payer: Self-pay

## 2024-03-25 ENCOUNTER — Other Ambulatory Visit: Payer: Self-pay | Admitting: Cardiology

## 2024-03-25 ENCOUNTER — Other Ambulatory Visit: Payer: Self-pay | Admitting: Licensed Clinical Social Worker

## 2024-03-25 DIAGNOSIS — I4891 Unspecified atrial fibrillation: Secondary | ICD-10-CM

## 2024-03-27 NOTE — Patient Outreach (Signed)
 Complex Care Management   Visit Note  03/27/2024  Name:  Sarah Miller MRN: [id number] DOB: 1946/02/03  Situation: Referral received for Complex Care Management related to increased level of care. I obtained verbal consent from POA.  Visit completed with POA  on the phone.  Background:   Past Medical History:  Diagnosis Date   Allergic rhinitis    Anxiety    Atrial fibrillation with RVR    CAD (coronary artery disease), native coronary artery    coronary CTA done 12/25/2019 showing coronary calcium  score of 60 which was 72nd percentile for age and sex matched controls along with 50 to 70% proximal RCA that was nondominant, less than 25% mid LAD as well as mid left circumflex.   Cerebral ventriculomegaly 12/13/2023   Cervical arthritis    [name], S/P injection with good results.    Chronic diastolic CHF (congestive heart failure)    COPD (chronic obstructive pulmonary disease)    with emphysema     DOE (dyspnea on exertion) 02/03/2019   - PFTs 02/02/2019 unable to do spirometry, mod decreased lung vol with ERV 10% c/w obesity effects      Dyslipidemia    Early menopause    Frequent falls    Frozen shoulder    H/O frozen shoulder and rotator cuff tendonititis, L s/p injection   Gastroesophageal reflux disease without esophagitis    Hardening of the aorta (main artery of the heart) 11/29/2023   History of melanoma    1978 and 1979   Intrinsic sphincter deficiency (ISD) 02/06/2017   Added automatically from request for surgery [id number]     Iron deficiency anemia    Mild neurocognitive disorder 12/13/2023   Morbid obesity 02/03/2019   PFTs 02/02/2019 with erv 10% c/w body habitus     Neutrophilia    Obstructive sleep apnea    Osteopenia    PONV (postoperative nausea and vomiting)    Pulmonary hypertension    RBBB (right bundle branch block) 03/19/2013   Recurrent major depression in remission    Stage 3 chronic kidney disease    Type 2 diabetes mellitus    Upper  airway cough syndrome vs cough variant asthma 04/18/2018   Onset mid sept 2019  - Allergy  profile  02/28/2018    IgE  312  RAST neg     - 04/17/2018 d/c breo/ max rx for gerd/ cyclical cough   - PFTs 02/02/2019 unable to do spirometry, mod decreased lung vol with ERV 10% c/w obesity effects   - 08/05/2019  After extensive coaching inhaler device,  effectiveness =    75% (short Ti)      Urinary incontinence    Vitamin D  deficiency     Assessment: LCSW spoke to patients POA/ daughter [name] on the phone. [name] states she spoke with someone at [facility] and stats that patient is on the waitlist and explained she may be able to get her expedited. [name] stated patient has a hospital bed that will be delivered in the next few days. [name] stated that [employer] has the type of medicaid that pays her medicare premiums . [name] hopes to get her switched to [employer]. LCSW encouraged camille to reach out to her case worker to inquire about steps. Patient will also be moving from her home into an apartment.   Patient Reported Symptoms:  Cognitive Cognitive Status: Unable to Assess (Assessment completed with patients daughter) Cognitive/Intellectual Conditions Management [RPT]: None reported or documented in medical history  or problem list   Health Maintenance Behaviors: Annual physical exam Healing Pattern: Average  Neurological Neurological Review of Symptoms: No symptoms reported    HEENT HEENT Symptoms Reported: No symptoms reported      Cardiovascular Cardiovascular Symptoms Reported: No symptoms reported    Respiratory Respiratory Symptoms Reported: No symptoms reported, Dry cough Other Respiratory Symptoms: chronic cough    Endocrine Endocrine Symptoms Reported: No symptoms reported Is patient diabetic?: Yes Is patient checking blood sugars at home?: Yes List most recent blood sugar readings, include date and time of day: 92, checked this morning before breakfast Endocrine Self-Management  Outcome: 3 (uncertain)  Gastrointestinal Gastrointestinal Symptoms Reported: Incontinence Additional Gastrointestinal Details: wears adult diapers      Genitourinary Genitourinary Symptoms Reported: Incontinence Additional Genitourinary Details: wears adult diapers    Integumentary Integumentary Symptoms Reported: No symptoms reported    Musculoskeletal Musculoskelatal Symptoms Reviewed: Unsteady gait, Weakness Additional Musculoskeletal Details: uses walker        Psychosocial Psychosocial Symptoms Reported: No symptoms reported     Quality of Family Relationships: involved, supportive    03/27/2024    PHQ2-9 Depression Screening   Little interest or pleasure in doing things Not at all  Feeling down, depressed, or hopeless Not at all  PHQ-2 - Total Score 0  Trouble falling or staying asleep, or sleeping too much    Feeling tired or having little energy    Poor appetite or overeating     Feeling bad about yourself - or that you are a failure or have let yourself or your family down    Trouble concentrating on things, such as reading the newspaper or watching television    Moving or speaking so slowly that other people could have noticed.  Or the opposite - being so fidgety or restless that you have been moving around a lot more than usual    Thoughts that you would be better off dead, or hurting yourself in some way    PHQ2-9 Total Score    If you checked off any problems, how difficult have these problems made it for you to do your work, take care of things at home, or get along with other people    Depression Interventions/Treatment      There were no vitals filed for this visit.    Medications Reviewed Today     Reviewed by Veva Bolt, LCSW (Social Worker) on 03/25/24 at 1520  Med List Status: <None>   Medication Order Taking? Sig Documenting Provider Last Dose Status Informant  albuterol  (VENTOLIN  HFA) 108 (90 Base) MCG/ACT inhaler [id number] No Inhale 1-2 puffs  into the lungs every 6 (six) hours as needed for wheezing or shortness of breath. [provider] 01/15/2024 Active Self  atorvastatin  (LIPITOR) 10 MG tablet [id number] No Take 10 mg by mouth at bedtime. [provider] 01/15/2024 Active Self  buPROPion  (WELLBUTRIN  XL) 300 MG 24 hr tablet [id number] No Take 300 mg by mouth daily.  [provider] 01/16/2024  8:00 AM Active Self  cephALEXin  (KEFLEX ) 250 MG capsule [id number] No Take 250 mg by mouth daily. [provider] 01/16/2024  8:00 AM Active Self  Cholecalciferol  (VITAMIN D3) 125 MCG (5000 UT) CAPS [id number] No Take 5,000 Units by mouth daily. [provider] More than a month Active Self  ELIQUIS  5 MG TABS tablet [id number]  TAKE 1 TABLET BY MOUTH TWICE A DAY Turner, Traci R, MD  Active   FLUoxetine  (PROZAC ) 20 MG capsule [id number] No Take 20  mg by mouth daily. [provider] 01/16/2024  8:00 AM Active Self  fluticasone  (FLONASE ) 50 MCG/ACT nasal spray [id number] No SPRAY 2 SPRAYS INTO EACH NOSTRIL EVERY DAY  Patient not taking: Reported on 01/10/2024   Neda Jennet LABOR, MD Not Taking Active Self  furosemide  (LASIX ) 20 MG tablet [id number] No Take 1 tablet (20 mg total) by mouth daily as needed for fluid or edema.  Patient not taking: Reported on 01/10/2024   Vann, Jessica U, DO Not Taking Active Self  Insulin  Glargine (BASAGLAR  [facility]) 100 UNIT/ML [id number] No Inject 20 Units into the skin at bedtime.  Patient taking differently: Inject 23 Units into the skin at bedtime.   Vann, Jessica U, DO 01/15/2024 Bedtime Active Self           Med Note (RAKHIMOVA, RAUSHANIYA R   Thu Jan 16, 2024 12:08 PM) 11 units   metFORMIN  (GLUCOPHAGE -XR) 500 MG 24 hr tablet [id number] No Take 500 mg by mouth in the morning and at bedtime. [provider] 01/15/2024 Active Self  montelukast  (SINGULAIR ) 10 MG tablet [id number] No TAKE 1 TABLET BY MOUTH EVERYDAY AT BEDTIME Wert, Michael B, MD 01/15/2024 Active  Self  MOUNJARO 7.5 MG/0.5ML Pen [id number] No Inject 7.5 mg into the skin once a week. [provider] 01/05/2024 Active Self  ondansetron  (ZOFRAN ) 4 MG tablet [id number] No Take 4 mg by mouth every 8 (eight) hours as needed for nausea or vomiting. [provider] More than a month Active Self  ONETOUCH ULTRA TEST test strip [id number]  SMARTSIG:50 Daily [provider]  Active Self  traMADol  (ULTRAM ) 50 MG tablet [id number]  Take 1 tablet (50 mg total) by mouth every 6 (six) hours as needed for moderate pain (pain score 4-6). Tomlinson, Sara Caylin, PA-C  Active             Recommendation:   PCP Follow-up Continue Current Plan of Care  Follow Up Plan:   Telephone follow up appointment date/time:  04/13/24 at 9:30am.  Cena Ligas, LCSW Clinical Social Worker [facility]

## 2024-03-27 NOTE — Patient Instructions (Signed)
 Visit Information  Thank you for taking time to visit with me today. Please don't hesitate to contact me if I can be of assistance to you before our next scheduled appointment.  Our next appointment is by telephone on  at 04/13/24 at 9:30am. Please call the care guide team at [phone] if you need to cancel or reschedule your appointment.   Following is a copy of your care plan:   Goals Addressed             This Visit's Progress    [facility] Social Work Care Plan LCSW       Problems:   Level of Care Concerns:Facility placement  Inability to perform ADL's independently Memory Deficits  CSW Clinical Goal(s):   Over the next 90 days, the patient will complete and submit an application for long-term Medicaid, including all required documentation, in order to become eligible for placement in a long-term care facility.  Over the next 30 days, patient and daughter will submit all required documentation LTC medicaid.   Interventions:  Level of Care Concerns in a patient with Atrial Fibrillation, Depression, and DMII Current level of care: Home with other family or significant other(s): daughterGLENWOOD Gammons  Evaluation of patient's unmet needs in current living environment ADL's Assessed needs, level of care concerns, how currently meeting needs and barriers to care Collaborate with primary care provider ref completing [facility] referral [facility])- on waitlist Long-Term Care Insurance   Facility  Assessed needs and reviewed facility placement process; as well as the different levels of care Assist with faxing FL2 to identified facilities once completed and signed by PCP Emotional Support Provided Obtained verbal permission to fax FL2 and supporting document to facilities as well as obtain PASSAR Reviewed facility placement process   Advance Directive Active listening / Reflection utilized  Active listening / Reflection utilized Consideration on in-home help  encouraged : options discussed Emotional Support Provided  Patient Goals/Self-Care Activities:  Call Department of Social Services [phone] to apply for LTC medicaid. Continue taking your medication as prescribed.    Plan:   Telephone follow up appointment with care management team member scheduled for:  04/13/24 at 9:30am.        Please call 911 if you are experiencing a Mental Health or Behavioral Health Crisis or need someone to talk to.  Health Care Power of Attorney /daughter Gammons verbalized understanding of Care plan and visit instructions communicated this visit  Cena Ligas, LCSW Clinical Social Worker [facility]

## 2024-04-13 ENCOUNTER — Telehealth: Admitting: Licensed Clinical Social Worker

## 2024-09-01 ENCOUNTER — Ambulatory Visit: Payer: Self-pay | Admitting: Neurology
